# Patient Record
Sex: Female | Born: 1945
Health system: Southern US, Community
[De-identification: ages and names within clinical notes are randomized; demographics above are authoritative.]

## PROBLEM LIST (undated history)

## (undated) DIAGNOSIS — I10 Essential (primary) hypertension: Secondary | ICD-10-CM

## (undated) DIAGNOSIS — E039 Hypothyroidism, unspecified: Secondary | ICD-10-CM

## (undated) DIAGNOSIS — E079 Disorder of thyroid, unspecified: Secondary | ICD-10-CM

## (undated) DIAGNOSIS — I471 Supraventricular tachycardia, unspecified: Secondary | ICD-10-CM

## (undated) DIAGNOSIS — E876 Hypokalemia: Secondary | ICD-10-CM

## (undated) DIAGNOSIS — I491 Atrial premature depolarization: Secondary | ICD-10-CM

## (undated) HISTORY — DX: Disorder of thyroid, unspecified: E07.9

## (undated) HISTORY — DX: Hypothyroidism, unspecified: E03.9

## (undated) HISTORY — DX: Supraventricular tachycardia, unspecified: I47.10

## (undated) HISTORY — DX: Essential (primary) hypertension: I10

## (undated) HISTORY — DX: Atrial premature depolarization: I49.1

## (undated) HISTORY — DX: Hypokalemia: E87.6

---

## 1967-10-22 HISTORY — PX: TUBAL LIGATION: SHX77

## 1978-10-21 HISTORY — PX: VAGINAL HYSTERECTOMY: SUR661

## 1978-10-21 HISTORY — PX: ABDOMINAL HYSTERECTOMY: SHX81

## 2004-10-21 HISTORY — PX: BUNIONECTOMY: SHX129

## 2010-10-10 ENCOUNTER — Ambulatory Visit: Payer: Self-pay | Admitting: Family Medicine

## 2011-10-10 ENCOUNTER — Ambulatory Visit: Payer: Self-pay | Admitting: Family Medicine

## 2011-12-10 DIAGNOSIS — R42 Dizziness and giddiness: Secondary | ICD-10-CM | POA: Diagnosis not present

## 2012-03-24 DIAGNOSIS — E039 Hypothyroidism, unspecified: Secondary | ICD-10-CM | POA: Diagnosis not present

## 2012-03-24 DIAGNOSIS — I10 Essential (primary) hypertension: Secondary | ICD-10-CM | POA: Diagnosis not present

## 2012-05-20 DIAGNOSIS — I10 Essential (primary) hypertension: Secondary | ICD-10-CM | POA: Diagnosis not present

## 2012-05-27 DIAGNOSIS — I10 Essential (primary) hypertension: Secondary | ICD-10-CM | POA: Diagnosis not present

## 2012-05-27 DIAGNOSIS — E039 Hypothyroidism, unspecified: Secondary | ICD-10-CM | POA: Diagnosis not present

## 2012-07-21 DIAGNOSIS — J0100 Acute maxillary sinusitis, unspecified: Secondary | ICD-10-CM | POA: Diagnosis not present

## 2012-08-20 ENCOUNTER — Emergency Department: Payer: Self-pay | Admitting: Emergency Medicine

## 2012-09-06 ENCOUNTER — Emergency Department: Payer: Self-pay | Admitting: Internal Medicine

## 2012-09-06 LAB — CBC
HCT: 45.2 %
HGB: 15.3 g/dL
MCH: 28.8 pg
MCHC: 33.8 g/dL
MCV: 85 fL
Platelet: 282 x10 3/mm 3
RBC: 5.3 X10 6/mm 3 — ABNORMAL HIGH
RDW: 13.4 %
WBC: 8.6 x10 3/mm 3

## 2012-09-06 LAB — COMPREHENSIVE METABOLIC PANEL
Albumin: 4.1 g/dL (ref 3.4–5.0)
Anion Gap: 7 (ref 7–16)
BUN: 13 mg/dL (ref 7–18)
Bilirubin,Total: 0.3 mg/dL (ref 0.2–1.0)
Chloride: 108 mmol/L — ABNORMAL HIGH (ref 98–107)
Creatinine: 0.82 mg/dL (ref 0.60–1.30)
EGFR (African American): >60
Glucose: 118 mg/dL — ABNORMAL HIGH (ref 65–99)
Osmolality: 284 (ref 275–301)
Potassium: 3.6 mmol/L (ref 3.5–5.1)
SGOT(AST): 21 U/L (ref 15–37)
Sodium: 142 mmol/L (ref 136–145)
Total Protein: 7.8 g/dL (ref 6.4–8.2)

## 2012-09-06 LAB — URINALYSIS, COMPLETE
Bacteria: NONE SEEN
Bilirubin,UR: NEGATIVE
Glucose,UR: NEGATIVE mg/dL
Ketone: NEGATIVE
Nitrite: NEGATIVE
Ph: 7
Protein: NEGATIVE
RBC,UR: 3 /HPF
Specific Gravity: 1.011
Squamous Epithelial: 2
WBC UR: 2 /HPF

## 2012-09-29 DIAGNOSIS — N8111 Cystocele, midline: Secondary | ICD-10-CM | POA: Diagnosis not present

## 2012-09-29 DIAGNOSIS — N952 Postmenopausal atrophic vaginitis: Secondary | ICD-10-CM | POA: Diagnosis not present

## 2012-10-26 DIAGNOSIS — R319 Hematuria, unspecified: Secondary | ICD-10-CM | POA: Diagnosis not present

## 2012-11-05 DIAGNOSIS — Z23 Encounter for immunization: Secondary | ICD-10-CM | POA: Diagnosis not present

## 2012-11-27 DIAGNOSIS — I10 Essential (primary) hypertension: Secondary | ICD-10-CM | POA: Diagnosis not present

## 2012-11-27 DIAGNOSIS — E039 Hypothyroidism, unspecified: Secondary | ICD-10-CM | POA: Diagnosis not present

## 2013-06-25 DIAGNOSIS — E039 Hypothyroidism, unspecified: Secondary | ICD-10-CM | POA: Diagnosis not present

## 2013-07-02 DIAGNOSIS — E039 Hypothyroidism, unspecified: Secondary | ICD-10-CM | POA: Diagnosis not present

## 2013-10-26 DIAGNOSIS — Z23 Encounter for immunization: Secondary | ICD-10-CM | POA: Diagnosis not present

## 2014-03-01 DIAGNOSIS — E039 Hypothyroidism, unspecified: Secondary | ICD-10-CM | POA: Diagnosis not present

## 2014-03-04 ENCOUNTER — Ambulatory Visit: Payer: Self-pay

## 2014-03-08 DIAGNOSIS — E039 Hypothyroidism, unspecified: Secondary | ICD-10-CM | POA: Diagnosis not present

## 2014-03-23 DIAGNOSIS — B9689 Other specified bacterial agents as the cause of diseases classified elsewhere: Secondary | ICD-10-CM | POA: Diagnosis not present

## 2014-03-23 DIAGNOSIS — J329 Chronic sinusitis, unspecified: Secondary | ICD-10-CM | POA: Diagnosis not present

## 2014-03-23 DIAGNOSIS — Z1239 Encounter for other screening for malignant neoplasm of breast: Secondary | ICD-10-CM | POA: Diagnosis not present

## 2014-03-23 DIAGNOSIS — I10 Essential (primary) hypertension: Secondary | ICD-10-CM | POA: Diagnosis not present

## 2014-03-23 DIAGNOSIS — A499 Bacterial infection, unspecified: Secondary | ICD-10-CM | POA: Diagnosis not present

## 2014-06-06 DIAGNOSIS — E039 Hypothyroidism, unspecified: Secondary | ICD-10-CM | POA: Diagnosis not present

## 2014-06-10 DIAGNOSIS — E039 Hypothyroidism, unspecified: Secondary | ICD-10-CM | POA: Diagnosis not present

## 2014-09-12 DIAGNOSIS — E039 Hypothyroidism, unspecified: Secondary | ICD-10-CM | POA: Diagnosis not present

## 2014-09-19 DIAGNOSIS — E039 Hypothyroidism, unspecified: Secondary | ICD-10-CM | POA: Diagnosis not present

## 2015-01-19 DIAGNOSIS — I10 Essential (primary) hypertension: Secondary | ICD-10-CM | POA: Diagnosis not present

## 2015-01-19 DIAGNOSIS — M489 Spondylopathy, unspecified: Secondary | ICD-10-CM | POA: Diagnosis not present

## 2015-01-19 DIAGNOSIS — M158 Other polyosteoarthritis: Secondary | ICD-10-CM | POA: Diagnosis not present

## 2015-01-19 DIAGNOSIS — E039 Hypothyroidism, unspecified: Secondary | ICD-10-CM | POA: Diagnosis not present

## 2015-01-24 DIAGNOSIS — M1712 Unilateral primary osteoarthritis, left knee: Secondary | ICD-10-CM | POA: Diagnosis not present

## 2015-01-24 DIAGNOSIS — M19022 Primary osteoarthritis, left elbow: Secondary | ICD-10-CM | POA: Diagnosis not present

## 2015-01-24 DIAGNOSIS — M19011 Primary osteoarthritis, right shoulder: Secondary | ICD-10-CM | POA: Diagnosis not present

## 2015-05-09 DIAGNOSIS — E039 Hypothyroidism, unspecified: Secondary | ICD-10-CM | POA: Diagnosis not present

## 2015-05-09 DIAGNOSIS — E079 Disorder of thyroid, unspecified: Secondary | ICD-10-CM | POA: Diagnosis not present

## 2015-07-30 DIAGNOSIS — Z23 Encounter for immunization: Secondary | ICD-10-CM | POA: Diagnosis not present

## 2015-10-19 ENCOUNTER — Telehealth: Payer: Self-pay

## 2015-10-19 NOTE — Telephone Encounter (Signed)
The form is ready but because she has not followed up for a Medicare Wellness exam, seen only 2x by Dr. Nadine Counts (last visit 12/2014, chart not in EPIC), there will be a charge of $25 when she picks up her form.   All Medicare patients are required to have annual medicare wellness exams once a year.

## 2015-10-19 NOTE — Telephone Encounter (Signed)
Patient called wanting to know if her handicap sticker paperwork was completed so she can come by and pick it up. Patient was informed that Dr. Nadine Counts is out of the office today but will return tomorrow and that her forms should be ready for pick up in the morning.  Patient stated that she will call back in the morning.

## 2015-11-08 DIAGNOSIS — E039 Hypothyroidism, unspecified: Secondary | ICD-10-CM | POA: Diagnosis not present

## 2015-11-15 DIAGNOSIS — E039 Hypothyroidism, unspecified: Secondary | ICD-10-CM | POA: Diagnosis not present

## 2016-04-18 ENCOUNTER — Other Ambulatory Visit: Payer: Self-pay | Admitting: Family Medicine

## 2016-04-18 DIAGNOSIS — R39198 Other difficulties with micturition: Secondary | ICD-10-CM | POA: Insufficient documentation

## 2016-04-18 LAB — URINALYSIS W MICROSCOPIC + REFLEX CULTURE
BACTERIA UA: NONE SEEN [HPF]
Bilirubin Urine: NEGATIVE
CASTS: NONE SEEN [LPF]
CRYSTALS: NONE SEEN [HPF]
Glucose, UA: NEGATIVE
HGB URINE DIPSTICK: NEGATIVE
Ketones, ur: NEGATIVE
Leukocytes, UA: NEGATIVE
Nitrite: NEGATIVE
PROTEIN: NEGATIVE
RBC / HPF: NONE SEEN RBC/HPF (ref <=2)
Specific Gravity, Urine: 1.009 (ref 1.001–1.035)
Squamous Epithelial / LPF: NONE SEEN [HPF] (ref <=5)
WBC, UA: NONE SEEN WBC/HPF (ref <=5)
YEAST: NONE SEEN [HPF]
pH: 7 (ref 5.0–8.0)

## 2016-05-07 DIAGNOSIS — E039 Hypothyroidism, unspecified: Secondary | ICD-10-CM | POA: Diagnosis not present

## 2016-05-20 DIAGNOSIS — E039 Hypothyroidism, unspecified: Secondary | ICD-10-CM | POA: Diagnosis not present

## 2016-07-09 DIAGNOSIS — Z23 Encounter for immunization: Secondary | ICD-10-CM | POA: Diagnosis not present

## 2016-09-02 ENCOUNTER — Encounter: Payer: Self-pay | Admitting: Family Medicine

## 2016-09-03 ENCOUNTER — Encounter: Payer: Self-pay | Admitting: Family Medicine

## 2016-09-03 ENCOUNTER — Ambulatory Visit (INDEPENDENT_AMBULATORY_CARE_PROVIDER_SITE_OTHER): Payer: Medicare Other | Admitting: Family Medicine

## 2016-09-03 DIAGNOSIS — I10 Essential (primary) hypertension: Secondary | ICD-10-CM | POA: Diagnosis not present

## 2016-09-03 DIAGNOSIS — J019 Acute sinusitis, unspecified: Secondary | ICD-10-CM | POA: Diagnosis not present

## 2016-09-03 DIAGNOSIS — J069 Acute upper respiratory infection, unspecified: Secondary | ICD-10-CM | POA: Diagnosis not present

## 2016-09-03 DIAGNOSIS — E039 Hypothyroidism, unspecified: Secondary | ICD-10-CM | POA: Diagnosis not present

## 2016-09-03 DIAGNOSIS — G8929 Other chronic pain: Secondary | ICD-10-CM

## 2016-09-03 DIAGNOSIS — M25562 Pain in left knee: Secondary | ICD-10-CM | POA: Diagnosis not present

## 2016-09-03 DIAGNOSIS — Z636 Dependent relative needing care at home: Secondary | ICD-10-CM | POA: Diagnosis not present

## 2016-09-03 DIAGNOSIS — B9789 Other viral agents as the cause of diseases classified elsewhere: Secondary | ICD-10-CM | POA: Diagnosis not present

## 2016-09-03 MED ORDER — AMOXICILLIN-POT CLAVULANATE 875-125 MG PO TABS: 1.0000 | ORAL_TABLET | Freq: Two times a day (BID) | ORAL | 0 refills | Status: DC

## 2016-09-03 NOTE — Progress Notes (Signed)
BP (!) 152/100   Temp 98.6 F (37 C) (Oral)   Resp 16   Ht 5\' 2"  (1.575 m)   Wt 136 lb (61.7 kg)   SpO2 98%   BMI 24.87 kg/m    Subjective:    Patient ID: Annette Stevens, female    DOB: 07-14-1946, 70 y.o.   MRN: [id number]  HPI: Annette Stevens is a 70 y.o. female  Chief Complaint  Patient presents with  . URI    sore throat, ear pain,headache,cough, congestion. onset 5 days   Patient is here for an acute visit; sick for five days Sinus pressure, chest congestion Sore throat, irritated; using apple cider vinegar, generic 1/4 pill of cough and cold HBP; sensitive to pills Temp is a degree or two hgher than usual; few chills Lots of stress with husband sick, not much time for taking care of herself  Left knee pain; tripped and slammed kneecap on the driveway; months ago; fatty growth behind lateral LEFT knee  Hard little knot lateral right foot; uses some tape  Hypothyroidism; going to see Dr. Gabriel Carina  High blood pressure; has tried so many medicines; patient politely refuses any medicine  Depression screen [facility] 2/9 09/03/2016  Decreased Interest 0  Down, Depressed, Hopeless 0  PHQ - 2 Score 0   Relevant past medical, surgical, family and social history reviewed Past Medical History:  Diagnosis Date  . Hypertension   . Thyroid disease    Past Surgical History:  Procedure Laterality Date  . ABDOMINAL HYSTERECTOMY  1980  . BUNIONECTOMY Left 2006  . TUBAL LIGATION  1969   Family History  Problem Relation Age of Onset  . Cancer Mother     colon  . Celiac disease Mother   . Lymphoma Father   . Hypertension Father    Social History  Substance Use Topics  . Smoking status: Former Smoker    Quit date: 1995  . Smokeless tobacco: Never Used  . Alcohol use No   Interim medical history since last visit reviewed. Allergies and medications reviewed  Review of Systems Per HPI unless specifically indicated above     Objective:    BP (!) 152/100   Temp 98.6 F  (37 C) (Oral)   Resp 16   Ht 5\' 2"  (1.575 m)   Wt 136 lb (61.7 kg)   SpO2 98%   BMI 24.87 kg/m   Wt Readings from Last 3 Encounters:  09/03/16 136 lb (61.7 kg)    Physical Exam  Constitutional: She appears well-developed and well-nourished. No distress.  HENT:  Head: Normocephalic and atraumatic.  Nose: Mucosal edema and rhinorrhea present. Right sinus exhibits no maxillary sinus tenderness and no frontal sinus tenderness. Left sinus exhibits no maxillary sinus tenderness and no frontal sinus tenderness.  Mouth/Throat: Oropharynx is clear and moist and mucous membranes are normal.  Swelling and erythema left much more than right; cloudy rhinorrhea  Eyes: EOM are normal. No scleral icterus.  Neck: No thyromegaly present.  Cardiovascular: Normal rate, regular rhythm and normal heart sounds.   No murmur heard. Pulmonary/Chest: Effort normal and breath sounds normal. No respiratory distress. She has no wheezes.  Abdominal: Soft. Bowel sounds are normal. She exhibits no distension.  Musculoskeletal: Normal range of motion. She exhibits no edema.       Left knee: She exhibits swelling (lateral and posterior left knee, soft). She exhibits no deformity. Tenderness found.  Lymphadenopathy:    She has no cervical adenopathy.  Neurological: She is  alert. She exhibits normal muscle tone.  Skin: Skin is warm and dry. She is not diaphoretic. No pallor.  Psychiatric: She has a normal mood and affect. Her behavior is normal. Judgment and thought content normal.   Results for orders placed or performed in visit on 04/18/16  Urinalysis w microscopic + reflex cultur  Result Value Ref Range   Color, Urine YELLOW YELLOW   APPearance CLEAR CLEAR   Specific Gravity, Urine 1.009 1.001 - 1.035   pH 7.0 5.0 - 8.0   Glucose, UA NEGATIVE NEGATIVE   Bilirubin Urine NEGATIVE NEGATIVE   Ketones, ur NEGATIVE NEGATIVE   Hgb urine dipstick NEGATIVE NEGATIVE   Protein, ur NEGATIVE NEGATIVE   Nitrite  NEGATIVE NEGATIVE   Leukocytes, UA NEGATIVE NEGATIVE   WBC, UA NONE SEEN <=5 WBC/HPF   RBC / HPF NONE SEEN <=2 RBC/HPF   Squamous Epithelial / LPF NONE SEEN <=5 HPF   Bacteria, UA NONE SEEN NONE SEEN HPF   Crystals NONE SEEN NONE SEEN HPF   Casts NONE SEEN NONE SEEN LPF   Yeast NONE SEEN NONE SEEN HPF      Assessment & Plan:   Problem List Items Addressed This Visit      Cardiovascular and Mediastinum   Essential hypertension, benign    Patient refuses medicine; encouraged DASH guidelines; risk of stroke with uncontrolled BP        Respiratory   Viral URI    Suspect this started as viral illness, but she developed secondary bacterial infection in the sinuses; rest, hydration; discussed stress, caregiving, time for taking care of herself important, etc      Acute sinusitis    Suspect this started as viral process, now secondary bacterial infection; rest, hydration, etc      Relevant Medications   amoxicillin-clavulanate (AUGMENTIN) 875-125 MG tablet     Endocrine   Hypothyroidism    Followed by endocrinologist      Relevant Medications   levothyroxine (SYNTHROID, LEVOTHROID) 125 MCG tablet     Other   Left knee pain    Call if xray or referral desired      Caregiver stress    Talked with patient about her stress, supportive listening provided; her husband has metastatic cancer; encouraged her to take time for herself          Follow up plan: Return in about 3 weeks (around 09/24/2016) for follow-up, blood pressure.  An after-visit summary was printed and given to the patient at [place].  Please see the patient instructions which may contain other information and recommendations beyond what is mentioned above in the assessment and plan.  Meds ordered this encounter  Medications  . Cholecalciferol (VITAMIN D3) 5000 UNIT/ML LIQD    Sig: Take by mouth.  . Coenzyme Q10 10 MG capsule    Sig: Take by mouth.  . Omega-3 Fatty Acids (FISH OIL) 1000 MG CAPS     Sig: Take by mouth.  . Lutein 10 MG TABS    Sig: Take by mouth.  . Magnesium 100 MG CAPS    Sig: Take by mouth.  . Pediatric Multivitamins-Fl (MULTIVITAMINS/FL PO)    Sig: Take by mouth.  . Red Yeast Rice 600 MG TABS    Sig: Take by mouth.  . levothyroxine (SYNTHROID, LEVOTHROID) 125 MCG tablet    Sig: Take 125 mcg by mouth daily before breakfast.  . amoxicillin-clavulanate (AUGMENTIN) 875-125 MG tablet    Sig: Take 1 tablet by mouth 2 (two) times  daily.    Dispense:  20 tablet    Refill:  0   Face-to-face time with patient was more than 25 minutes, >50% time spent counseling and coordination of care

## 2016-09-03 NOTE — Patient Instructions (Signed)
Start antibiotics Please do eat yogurt daily or take a probiotic daily for the next month or two We want to replace the healthy germs in the gut If you notice foul, watery diarrhea in the next two months, schedule an appointment RIGHT AWAY Your goal blood pressure is less than 150 mmHg on top. Try to follow the DASH guidelines (DASH stands for Dietary Approaches to Stop Hypertension) Try to limit the sodium in your diet.  Ideally, consume less than 1.5 grams (less than 1,500mg ) per day. Do not add salt when cooking or at the table.  Check the sodium amount on labels when shopping, and choose items lower in sodium when given a choice. Avoid or limit foods that already contain a lot of sodium. Eat a diet rich in fruits and vegetables and whole grains.

## 2016-09-04 ENCOUNTER — Telehealth: Payer: Self-pay | Admitting: Family Medicine

## 2016-09-04 DIAGNOSIS — M25562 Pain in left knee: Secondary | ICD-10-CM | POA: Insufficient documentation

## 2016-09-04 DIAGNOSIS — G8929 Other chronic pain: Secondary | ICD-10-CM

## 2016-09-04 NOTE — Telephone Encounter (Signed)
Patient notified, and sent call up front to setup mychart.

## 2016-09-04 NOTE — Telephone Encounter (Signed)
Patient sent note through her husband's MyChart account: GREETINGS !!!!  Thank you for seeing me. One question...can I get the left knee x-rayed to see what's going on?? Also can I get an account number to go into my chart and not Frank's ???  Annette Stevens -------------------------------- Staff, Let her know I'll order xray Please help her get needed information to establish a MyChart account for herself Thank you

## 2016-09-04 NOTE — Assessment & Plan Note (Signed)
Xray requested by patient

## 2016-09-06 ENCOUNTER — Ambulatory Visit
Admission: RE | Admit: 2016-09-06 | Discharge: 2016-09-06 | Disposition: A | Discharge status: Home / Self Care | Payer: Medicare Other | Source: Ambulatory Visit | Attending: Family Medicine | Admitting: Family Medicine

## 2016-09-06 DIAGNOSIS — M25462 Effusion, left knee: Secondary | ICD-10-CM | POA: Diagnosis not present

## 2016-09-06 DIAGNOSIS — M1712 Unilateral primary osteoarthritis, left knee: Secondary | ICD-10-CM | POA: Insufficient documentation

## 2016-09-06 DIAGNOSIS — M25562 Pain in left knee: Secondary | ICD-10-CM | POA: Diagnosis not present

## 2016-09-11 ENCOUNTER — Telehealth: Payer: Self-pay | Admitting: Family Medicine

## 2016-09-11 NOTE — Telephone Encounter (Signed)
Patient sent a MyChart message through her husband's chart: Sent: 09/07/2016  7:48 PM  To: [facility]  Subject: Non-Urgent Medical Question             Greetings .Marland KitchenMarland KitchenHaving a problem getting into my chart. I'll sent this thru Frank's.  My tetanus shot was : 06/19/2008  Cecille Rubin shot was     :  01/07/2012    also what would the shot be for the knee ???? I don't just do things that my not be needed.   Maniyah Guymon    Still not feeling much better. Feels like I have a lump in my throat.   Staff, Please update her tetanus booster info if needed.  Please let patient know we really need to keep their two charts separate. Please only send information about him through his MyChart account, and set up an account for her own concerns. This is for their own safety.  In regards to her question, there are two kinds of shots that are put in the knee. One is a steroid for temporary relief of pain and inflammation. The other is a type of artificial cartilage, so to speak, and is usually given as a series of three. The orthopaedist can talk with her to help determine which is the best option for her.  Try gargling with salt water, drinking green tea, and getting some extra vitamin C. If symptoms persist, let us know.  Thank you

## 2016-09-13 DIAGNOSIS — E039 Hypothyroidism, unspecified: Secondary | ICD-10-CM | POA: Insufficient documentation

## 2016-09-13 DIAGNOSIS — I10 Essential (primary) hypertension: Secondary | ICD-10-CM | POA: Insufficient documentation

## 2016-09-13 DIAGNOSIS — Z636 Dependent relative needing care at home: Secondary | ICD-10-CM | POA: Insufficient documentation

## 2016-09-13 DIAGNOSIS — J069 Acute upper respiratory infection, unspecified: Secondary | ICD-10-CM | POA: Insufficient documentation

## 2016-09-13 DIAGNOSIS — J019 Acute sinusitis, unspecified: Secondary | ICD-10-CM | POA: Insufficient documentation

## 2016-09-13 NOTE — Assessment & Plan Note (Signed)
Talked with patient about her stress, supportive listening provided; her husband has metastatic cancer; encouraged her to take time for herself

## 2016-09-13 NOTE — Assessment & Plan Note (Signed)
Patient refuses medicine; encouraged DASH guidelines; risk of stroke with uncontrolled BP

## 2016-09-13 NOTE — Assessment & Plan Note (Signed)
Suspect this started as viral process, now secondary bacterial infection; rest, hydration, etc

## 2016-09-13 NOTE — Assessment & Plan Note (Signed)
Followed by endocrinologist

## 2016-09-13 NOTE — Assessment & Plan Note (Signed)
Call if xray or referral desired

## 2016-09-13 NOTE — Assessment & Plan Note (Addendum)
Suspect this started as viral illness, but she developed secondary bacterial infection in the sinuses; rest, hydration; discussed stress, caregiving, time for taking care of herself important, etc

## 2016-09-16 NOTE — Telephone Encounter (Signed)
Patient notified about knee injections and about putting info in husband chart.  Pt states still has sinus infection and states "has tried it your way, now its time for her way"  She wants a zpak? Also she states husband is sick and on chemo and has low immune system and he shouldn't be around someone with an infection, so she needs this gone.

## 2016-09-19 MED ORDER — AZITHROMYCIN 250 MG PO TABS: ORAL_TABLET | ORAL | 0 refills | Status: AC

## 2016-09-19 NOTE — Telephone Encounter (Signed)
I spoke w/patient; explained resistance to zpak 25%, she is willing to try Discussed her husband, support offered; prayer offered

## 2016-11-13 DIAGNOSIS — E039 Hypothyroidism, unspecified: Secondary | ICD-10-CM | POA: Diagnosis not present

## 2016-11-20 DIAGNOSIS — E039 Hypothyroidism, unspecified: Secondary | ICD-10-CM | POA: Diagnosis not present

## 2016-11-22 ENCOUNTER — Encounter: Payer: Self-pay | Admitting: Family Medicine

## 2016-11-25 DIAGNOSIS — E039 Hypothyroidism, unspecified: Secondary | ICD-10-CM | POA: Diagnosis not present

## 2017-02-06 DIAGNOSIS — E039 Hypothyroidism, unspecified: Secondary | ICD-10-CM | POA: Diagnosis not present

## 2017-02-10 DIAGNOSIS — E039 Hypothyroidism, unspecified: Secondary | ICD-10-CM | POA: Diagnosis not present

## 2017-03-05 ENCOUNTER — Ambulatory Visit (INDEPENDENT_AMBULATORY_CARE_PROVIDER_SITE_OTHER): Payer: Medicare Other | Admitting: Family Medicine

## 2017-03-05 ENCOUNTER — Encounter: Payer: Self-pay | Admitting: Family Medicine

## 2017-03-05 VITALS — BP 120/84 | HR 101 | Temp 98.5°F | Ht 62.0 in | Wt 137.5 lb

## 2017-03-05 DIAGNOSIS — N3001 Acute cystitis with hematuria: Secondary | ICD-10-CM | POA: Diagnosis not present

## 2017-03-05 LAB — POCT URINALYSIS DIPSTICK
BILIRUBIN UA: NEGATIVE
Glucose, UA: NEGATIVE
KETONES UA: NEGATIVE
Nitrite, UA: NEGATIVE
PH UA: 7.5 (ref 5.0–8.0)
SPEC GRAV UA: 1.01 (ref 1.010–1.025)
Urobilinogen, UA: NEGATIVE E.U./dL — AB

## 2017-03-05 MED ORDER — CIPROFLOXACIN HCL 250 MG PO TABS: 250.0000 mg | ORAL_TABLET | Freq: Two times a day (BID) | ORAL | 0 refills | Status: DC

## 2017-03-05 NOTE — Patient Instructions (Addendum)

## 2017-03-05 NOTE — Progress Notes (Addendum)
Name: Annette Stevens   MRN: [id number]    DOB: 01-10-1946   Date:03/05/2017       Progress Note  Subjective  Chief Complaint  Chief Complaint  Patient presents with  . Urinary Tract Infection    HPI  Pt presents with complaint of 1 day of hematuria, urinary frequency with pressure/urgency. No fevers/chills, abdominal pain, fatigue, or burning with urination.  No history of kidney stones.   Patient Active Problem List   Diagnosis Date Noted  . Essential hypertension, benign 09/13/2016  . Hypothyroidism 09/13/2016  . Viral URI 09/13/2016  . Caregiver stress 09/13/2016  . Acute sinusitis 09/13/2016  . Left knee pain 09/04/2016  . Abnormal urination 04/18/2016    Social History  Substance Use Topics  . Smoking status: Former Smoker    Quit date: 1995  . Smokeless tobacco: Never Used  . Alcohol use No     Current Outpatient Prescriptions:  .  Cholecalciferol (VITAMIN D3) 5000 UNIT/ML LIQD, Take by mouth., Disp: , Rfl:  .  Coenzyme Q10 10 MG capsule, Take by mouth., Disp: , Rfl:  .  levothyroxine (SYNTHROID, LEVOTHROID) 125 MCG tablet, Take 125 mcg by mouth daily before breakfast., Disp: , Rfl:  .  Lutein 10 MG TABS, Take by mouth., Disp: , Rfl:  .  Magnesium 100 MG CAPS, Take by mouth., Disp: , Rfl:  .  Omega-3 Fatty Acids (FISH OIL) 1000 MG CAPS, Take by mouth., Disp: , Rfl:  .  Pediatric Multivitamins-Fl (MULTIVITAMINS/FL PO), Take by mouth., Disp: , Rfl:  .  Red Yeast Rice 600 MG TABS, Take by mouth., Disp: , Rfl:   No Known Allergies  ROS  Constitutional: Negative for fever or weight change.  Respiratory: Negative for cough and shortness of breath.   Cardiovascular: Negative for chest pain or palpitations.  Gastrointestinal: Negative for abdominal pain, no bowel changes.  Musculoskeletal: Negative for gait problem or joint swelling.  Skin: Negative for rash.  Neurological: Negative for dizziness or headache.  No other specific complaints in a complete review of  systems (except as listed in HPI above).  Objective  Vitals:   03/05/17 1410  BP: 120/84  Pulse: (!) 101  Temp: 98.5 F (36.9 C)  TempSrc: Oral  Weight: 137 lb 8 oz (62.4 kg)  Height: 5\' 2"  (1.575 m)    Body mass index is 25.15 kg/m.  Nursing Note and Vital Signs reviewed.  Physical Exam  Constitutional: Patient appears well-developed and well-nourished. No distress.  HEENT: head atraumatic, normocephalic Cardiovascular: Normal rate, regular rhythm, S1/S2 present.  No murmur or rub heard. No BLE edema. Pulmonary/Chest: Effort normal and breath sounds clear. No respiratory distress or retractions. Abdominal: Soft and non-tender, bowel sounds present x4 quadrants. GU: Deferred. Psychiatric: Patient has a normal mood and affect. behavior is normal. Judgment and thought content normal.  Recent Results (from the past 2160 hour(s))  POCT urinalysis dipstick     Status: Abnormal   Collection Time: 03/05/17  2:18 PM  Result Value Ref Range   Color, UA yellow    Clarity, UA clear    Glucose, UA negative    Bilirubin, UA negative    Ketones, UA negative    Spec Grav, UA 1.010 1.010 - 1.025   Blood, UA large    pH, UA 7.5 5.0 - 8.0   Protein, UA trace    Urobilinogen, UA negative (A) 0.2 or 1.0 E.U./dL   Nitrite, UA negative    Leukocytes, UA Trace (A) Negative  Assessment & Plan  1. Acute cystitis with hematuria - POCT urinalysis dipstick - ciprofloxacin (CIPRO) 250 MG tablet; Take 1 tablet (250 mg total) by mouth 2 (two) times daily.  Dispense: 6 tablet; Refill: 0 - Urine Culture  -Red flags and when to present for emergency care or RTC including fever >101.62F, abdominal or back pain, new/worsening/un-resolving symptoms, reviewed with patient at time of visit. Follow up and care instructions discussed and provided in AVS.  -Needs to schedule AWV and annual exam - will schedule when she is able, but has very busy schedule caring for husband who has esophageal  cancer.  I have reviewed this encounter including the documentation in this note and/or discussed this patient with the Johney Maine, FNP, NP-C. I am certifying that I agree with the content of this note as supervising physician.  Steele Sizer, MD [facility] 03/05/2017, 2:41 PM

## 2017-03-06 LAB — URINE CULTURE: Organism ID, Bacteria: NO GROWTH

## 2017-03-07 NOTE — Progress Notes (Signed)
Please call patient - her urine culture showed no growth of bacteria, she may finish the Cipro if it's making her feel better, if it's not helping she can stop.  Because her urine dip showed large amnt of Hgb we need to bring her back for nurse visit for a urine recollect and send it for micro.  If the micro still shows blood or her symptoms persist, we'll send her to urology. Thank you!

## 2017-03-10 ENCOUNTER — Telehealth: Payer: Self-pay | Admitting: Family Medicine

## 2017-03-10 DIAGNOSIS — R319 Hematuria, unspecified: Secondary | ICD-10-CM

## 2017-03-10 DIAGNOSIS — R39198 Other difficulties with micturition: Secondary | ICD-10-CM | POA: Diagnosis not present

## 2017-03-10 DIAGNOSIS — N8110 Cystocele, unspecified: Secondary | ICD-10-CM

## 2017-03-10 NOTE — Telephone Encounter (Addendum)
Spoke with patient regarding urinary symptoms -  no longer experiencing urinary frequency/urgency or hematuria; Now notes that she has been having trouble emptying her bladder completely and states was seen several years ago for cystocele which has been progressively worsening. Advised we will still check urine Micro due to previous blood in urine, but she needs to see OB/GYN for evaluation. Urine Micro is ordered.

## 2017-03-11 LAB — URINALYSIS, MICROSCOPIC ONLY
Bacteria, UA: NONE SEEN [HPF]
CASTS: NONE SEEN [LPF]
Crystals: NONE SEEN [HPF]
RBC / HPF: NONE SEEN RBC/HPF (ref <=2)
Squamous Epithelial / LPF: NONE SEEN [HPF] (ref <=5)
WBC, UA: NONE SEEN WBC/HPF (ref <=5)
YEAST: NONE SEEN [HPF]

## 2017-03-11 NOTE — Telephone Encounter (Signed)
Hello Mrs. Marcial Pacas,  Your urine micro is completely negative, so there is no need to see a urologist.  I have placed the referral to Gynecology per our discussion. I advised Kristeen Miss (our referral coordinator) of your scheduling/location requests and she is working to secure an appointment for you. If you have any questions please do not hesitate to reach out.   Thank you, Raelyn Ensign NP-C

## 2017-03-12 ENCOUNTER — Telehealth: Payer: Self-pay | Admitting: Obstetrics & Gynecology

## 2017-03-12 NOTE — Telephone Encounter (Signed)
Called patient x 2- no answer.

## 2017-03-12 NOTE — Telephone Encounter (Signed)
Pt is being referred by [facility] medical for Female cystocele. Please schedule pt with MD when she returns call.

## 2017-03-13 NOTE — Telephone Encounter (Signed)
Left message for patient

## 2017-03-14 NOTE — Telephone Encounter (Signed)
Call pt and left voicemail to call back to be schedule

## 2017-03-18 NOTE — Telephone Encounter (Signed)
Pt is schedule [facility] on 04/10/17

## 2017-04-10 ENCOUNTER — Encounter: Payer: Self-pay | Admitting: Obstetrics & Gynecology

## 2017-04-10 ENCOUNTER — Ambulatory Visit (INDEPENDENT_AMBULATORY_CARE_PROVIDER_SITE_OTHER): Payer: Medicare Other | Admitting: Obstetrics & Gynecology

## 2017-04-10 VITALS — BP 148/90 | HR 76 | Ht 62.0 in | Wt 136.0 lb

## 2017-04-10 DIAGNOSIS — N8111 Cystocele, midline: Secondary | ICD-10-CM
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
## 2017-04-10 NOTE — Patient Instructions (Signed)
Pelvic Organ Prolapse Pelvic organ prolapse is the stretching, bulging, or dropping of pelvic organs into an abnormal position. It happens when the muscles and tissues that surround and support pelvic structures are stretched or weak. Pelvic organ prolapse can involve:  Vagina (vaginal prolapse).  Uterus (uterine prolapse).  Bladder (cystocele).  Rectum (rectocele).  Intestines (enterocele).  When organs other than the vagina are involved, they often bulge into the vagina or protrude from the vagina, depending on how severe the prolapse is. What are the causes? Causes of this condition include:  Pregnancy, labor, and childbirth.  Long-lasting (chronic) cough.  Chronic constipation.  Obesity.  Past pelvic surgery.  Aging. During and after menopause, a decreased production of the hormone estrogen can weaken pelvic ligaments and muscles.  Consistently lifting more than 50 lb (23 kg).  Buildup of fluid in the abdomen due to certain diseases and other conditions.  What are the signs or symptoms? Symptoms of this condition include:  Loss of bladder control when you cough, sneeze, strain, and exercise (stress incontinence). This may be worse immediately following childbirth, and it may gradually improve over time.  Feeling pressure in your pelvis or vagina. This pressure may increase when you cough or when you are having a bowel movement.  A bulge that protrudes from the opening of your vagina or against your vaginal wall. If your uterus protrudes through the opening of your vagina and rubs against your clothing, you may also experience soreness, ulcers, infection, pain, and bleeding.  Increased effort to have a bowel movement or urinate.  Pain in your low back.  Pain, discomfort, or disinterest in sexual intercourse.  Repeated bladder infections (urinary tract infections).  Difficulty inserting or inability to insert a tampon or applicator.  In some people, this  condition does not cause any symptoms. How is this diagnosed? Your health care provider may perform an internal and external vaginal and rectal exam. During the exam, you may be asked to cough and strain while you are lying down, sitting, and standing up. Your health care provider will determine if other tests are required, such as bladder function tests. How is this treated? In most cases, this condition needs to be treated only if it produces symptoms. No treatment is guaranteed to correct the prolapse or relieve the symptoms completely. Treatment may include:  Lifestyle changes, such as: ? Avoiding drinking beverages that contain caffeine. ? Increasing your intake of high-fiber foods. This can help to decrease constipation and straining during bowel movements. ? Emptying your bladder at scheduled times (bladder training therapy). This can help to reduce or avoid urinary incontinence. ? Losing weight if you are overweight or obese.  Estrogen. Estrogen may help mild prolapse by increasing the strength and tone of pelvic floor muscles.  Kegel exercises. These may help mild cases of prolapse by strengthening and tightening the muscles of the pelvic floor.  Pessary insertion. A pessary is a soft, flexible device that is placed into your vagina by your health care provider to help support the vaginal walls and keep pelvic organs in place.  Surgery. This is often the only form of treatment for severe prolapse. Different types of surgeries are available.  Follow these instructions at home:  Wear a sanitary pad or absorbent product if you have urinary incontinence.  Avoid heavy lifting and straining with exercise and work. Do not hold your breath when you perform mild to moderate lifting and exercise activities. Limit your activities as directed by your health care   provider.  Take medicines only as directed by your health care provider.  Perform Kegel exercises as directed by your health care  provider.  If you have a pessary, take care of it as directed by your health care provider. Contact a health care provider if:  Your symptoms interfere with your daily activities or sex life.  You need medicine to help with the discomfort.  You notice bleeding from the vagina that is not related to your period.  You have a fever.  You have pain or bleeding when you urinate.  You have bleeding when you have a bowel movement.  You lose urine when you have sex.  You have chronic constipation.  You have a pessary that falls out.  You have vaginal discharge that has a bad smell.  You have low abdominal pain or cramping that is unusual for you. This information is not intended to replace advice given to you by your health care provider. Make sure you discuss any questions you have with your health care provider. Document Released: 05/04/2014 Document Revised: 03/14/2016 Document Reviewed: 12/20/2013 Elsevier Interactive Patient Education  2018 Elsevier Inc.  

## 2017-04-11 ENCOUNTER — Telehealth: Payer: Self-pay | Admitting: Obstetrics & Gynecology

## 2017-04-11 ENCOUNTER — Encounter: Payer: Self-pay | Admitting: Obstetrics and Gynecology

## 2017-04-11 ENCOUNTER — Ambulatory Visit (INDEPENDENT_AMBULATORY_CARE_PROVIDER_SITE_OTHER): Payer: Medicare Other | Admitting: Obstetrics and Gynecology

## 2017-04-11 VITALS — BP 154/84 | HR 99

## 2017-04-11 DIAGNOSIS — T839XXA Unspecified complication of genitourinary prosthetic device, implant and graft, initial encounter: Secondary | ICD-10-CM | POA: Diagnosis not present

## 2017-04-11 NOTE — Telephone Encounter (Signed)
Pt is calling about her pessary. Pt states it fell out an she would like to be worked in on Monday schedule. Pt refused opening with Dr Glennon Mac opening in schedule. Please advise work [phone]

## 2017-04-11 NOTE — Telephone Encounter (Signed)
Pt to see Dr. Georgianne Fick 04/11/17

## 2017-04-11 NOTE — Telephone Encounter (Signed)
OK 

## 2017-04-14 ENCOUNTER — Ambulatory Visit: Payer: Medicare Other | Admitting: Obstetrics & Gynecology

## 2017-04-14 NOTE — Progress Notes (Signed)
Obstetrics & Gynecology Office Visit   Chief Complaint:  Chief Complaint  Patient presents with  . Pessary Check    pain since pessary insertion/ it fell out/ reinsertion     History of Present Illness: 71 year old patient s/p pessary insertion yesterday by Dr. Kenton Kingfisher presenting with pessary expulsion.  The patient denies pain or bleeding.  Pessary was not causing any discomfort while in and she noted improvement in her urinary symptoms while it was in place.     Review of Systems: 10 point review of sysems negative unless otherwise noted in HPI  Past Medical History:  Past Medical History:  Diagnosis Date  . Hypertension   . Thyroid disease     Past Surgical History:  Past Surgical History:  Procedure Laterality Date  . ABDOMINAL HYSTERECTOMY  1980  . BUNIONECTOMY Left 2006  . TUBAL LIGATION  1969    Gynecologic History: No LMP recorded. Patient has had a hysterectomy.  Obstetric History: [id number]  Family History:  Family History  Problem Relation Age of Onset  . Cancer Mother        colon  . Celiac disease Mother   . Lymphoma Father   . Hypertension Father     Social History:  Social History   Social History  . Marital status: Married    Spouse name: N/A  . Number of children: N/A  . Years of education: N/A   Occupational History  . Not on file.   Social History Main Topics  . Smoking status: Former Smoker    Quit date: 1995  . Smokeless tobacco: Never Used  . Alcohol use No  . Drug use: No  . Sexual activity: Not on file   Other Topics Concern  . Not on file   Social History Narrative  . No narrative on file    Allergies:  No Known Allergies  Medications: Prior to Admission medications   Medication Sig Start Date End Date Taking? Authorizing Provider  Cholecalciferol (VITAMIN D3) 5000 UNIT/ML LIQD Take by mouth. 09/06/10   [provider]  ciprofloxacin (CIPRO) 250 MG tablet Take 1 tablet (250 mg total) by mouth 2 (two)  times daily. 03/05/17   Hubbard Hartshorn, FNP  Coenzyme Q10 10 MG capsule Take by mouth. 09/06/10   [provider]  levothyroxine (SYNTHROID, LEVOTHROID) 125 MCG tablet Take 125 mcg by mouth daily before breakfast.    [provider]  Lutein 10 MG TABS Take by mouth.    [provider]  Magnesium 100 MG CAPS Take by mouth.    [provider]  Omega-3 Fatty Acids (FISH OIL) 1000 MG CAPS Take by mouth.    [provider]  Pediatric Multivitamins-Fl (MULTIVITAMINS/FL PO) Take by mouth.    [provider]  Red Yeast Rice 600 MG TABS Take by mouth. 09/06/10   [provider]    Physical Exam Vitals:  Vitals:   04/11/17 1606  BP: (!) 154/84  Pulse: 99   No LMP recorded. Patient has had a hysterectomy.  General: NAD HEENT: normocephalic, anicteric Pulmonary: No increased work of breathing Abdomen: Soft, non-tender, non-distended.  Umbilicus without lesions.  No hepatomegaly, splenomegaly or masses palpable. No evidence of hernia  Genitourinary:  External: Normal external female genitalia.  Normal urethral meatus, normal  Bartholin's and Skene's glands.    Vagina: Normal vaginal mucosa, moderate cystocele    Cervix: Grossly normal in appearance, no bleeding  Uterus: Non-enlarged, mobile, normal contour.  No CMT  Adnexa: ovaries non-enlarged, no adnexal masses  Rectal: deferred  Lymphatic: no evidence of inguinal lymphadenopathy Extremities: no edema, erythema, or tenderness Neurologic: Grossly intact Psychiatric: mood appropriate, affect full  Pessary was cleaned and replaced  Female chaperone present for pelvic portions of the physical exam  Assessment: 71 y.o. [id number] pessary reinsertion  Plan: Problem List Items Addressed This Visit    None    Visit Diagnoses    Problem with vaginal pessary, initial encounter ([place])    -  Primary      - pessary appear to fit well, if repeat expulsion I discussed sizing up - she  has follow up in 1 month but I would like her to see me or Dr. Kenton Kingfisher back in the next week to verify she is continuing to do well after re-insertion - A total of 15 minutes were spent in face-to-face contact with the patient during this encounter with over half of that time devoted to counseling and coordination of care.

## 2017-04-15 ENCOUNTER — Ambulatory Visit (INDEPENDENT_AMBULATORY_CARE_PROVIDER_SITE_OTHER): Payer: Medicare Other | Admitting: Obstetrics & Gynecology

## 2017-04-15 ENCOUNTER — Encounter: Payer: Self-pay | Admitting: Obstetrics & Gynecology

## 2017-04-15 VITALS — BP 140/80 | Ht 62.0 in | Wt 136.0 lb

## 2017-04-15 DIAGNOSIS — N8111 Cystocele, midline: Secondary | ICD-10-CM | POA: Insufficient documentation

## 2017-04-15 DIAGNOSIS — N993 Prolapse of vaginal vault after hysterectomy: Secondary | ICD-10-CM | POA: Diagnosis not present

## 2017-04-15 NOTE — Progress Notes (Signed)
  HPI:      [name] is a 71 y.o. [id number] who presents today for her pessary follow up and examination related to her pelvic floor weakening. Recent pessary ring #4 w 2 episodes of expulsion.  Did help while in briefly.  PMHx: She  has a past medical history of Hypertension and Thyroid disease. Also,  has a past surgical history that includes Abdominal hysterectomy (1980); Bunionectomy (Left, 2006); and Tubal ligation (1969)., family history includes Cancer in her mother; Celiac disease in her mother; Hypertension in her father; Lymphoma in her father.,  reports that she quit smoking about 23 years ago. She has never used smokeless tobacco. She reports that she does not drink alcohol or use drugs.  She has a current medication list which includes the following prescription(s): vitamin d3, ciprofloxacin, coenzyme q10, levothyroxine, lutein, magnesium, fish oil, pediatric multivitamins-fl, and red yeast rice. Also, has No Known Allergies.  Review of Systems  All other systems reviewed and are negative.   Objective: BP 140/80   Ht 5\' 2"  (1.575 m)   Wt 136 lb (61.7 kg)   BMI 24.87 kg/m  Physical Exam  Constitutional: She is oriented to person, place, and time. She appears well-developed and well-nourished. No distress.  Genitourinary: Vagina normal. Pelvic exam was performed with patient supine. There is no rash, tenderness or lesion on the right labia. There is no rash, tenderness or lesion on the left labia. No erythema or bleeding in the vagina.  Abdominal: Soft. She exhibits no distension. There is no tenderness.  Musculoskeletal: Normal range of motion.  Neurological: She is alert and oriented to person, place, and time. No cranial nerve deficit.  Skin: Skin is warm and dry.  Psychiatric: She has a normal mood and affect.    Pessary Care Vagina checked - without erosions - pessary #5 placed.  A/P: Pessary ring #5 placed, tol well. Gelhorn as next option if expulsion  occurs.  Barnett Applebaum, MD, [facility], [facility] 04/15/2017  2:56 PM

## 2017-05-01 ENCOUNTER — Telehealth: Payer: Self-pay

## 2017-05-01 ENCOUNTER — Ambulatory Visit: Payer: Medicare Other | Admitting: Obstetrics & Gynecology

## 2017-05-01 NOTE — Telephone Encounter (Signed)
Pt called the office and they transferred her to triage line. She stating she is having discomfort again from the pessary. It feels like her bladder is coming out again. She really needs to be seen today or tomorrow morning before noon because she takes care of her husband who has cancer.  CB# [phone]  Per Almyra Free, there are no openings today with [facility].

## 2017-05-01 NOTE — Telephone Encounter (Signed)
Have her come in that 330 spot we just vacated

## 2017-05-01 NOTE — Telephone Encounter (Signed)
PT aware, via voicemail, of the 3:30 appointment with [facility]. I told her to call back to verify. [place] aware to put her on schedule.

## 2017-05-02 ENCOUNTER — Ambulatory Visit (INDEPENDENT_AMBULATORY_CARE_PROVIDER_SITE_OTHER): Payer: Medicare Other | Admitting: Family Medicine

## 2017-05-02 ENCOUNTER — Encounter: Payer: Self-pay | Admitting: Family Medicine

## 2017-05-02 VITALS — BP 138/84 | HR 94 | Temp 98.1°F | Resp 16 | Ht 62.0 in | Wt 133.1 lb

## 2017-05-02 DIAGNOSIS — J069 Acute upper respiratory infection, unspecified: Secondary | ICD-10-CM | POA: Diagnosis not present

## 2017-05-02 DIAGNOSIS — H109 Unspecified conjunctivitis: Secondary | ICD-10-CM

## 2017-05-02 MED ORDER — ERYTHROMYCIN 5 MG/GM OP OINT: 1.0000 "application " | TOPICAL_OINTMENT | Freq: Three times a day (TID) | OPHTHALMIC | 0 refills | Status: DC

## 2017-05-02 MED ORDER — FLUTICASONE PROPIONATE 50 MCG/ACT NA SUSP: 2.0000 | Freq: Every day | NASAL | 6 refills | Status: DC

## 2017-05-02 NOTE — Patient Instructions (Addendum)
Please use over the counter Mucinex for chest congestion/cough. Drink plenty of water.  Viral Respiratory Infection A viral respiratory infection is an illness that affects parts of the body used for breathing, like the lungs, nose, and throat. It is caused by a germ called a virus. Some examples of this kind of infection are:  A cold.  The flu (influenza).  A respiratory syncytial virus (RSV) infection.  How do I know if I have this infection? Most of the time this infection causes:  A stuffy or runny nose.  Yellow or green fluid in the nose.  A cough.  Sneezing.  Tiredness (fatigue).  Achy muscles.  A sore throat.  Sweating or chills.  A fever.  A headache.  How is this infection treated? If the flu is diagnosed early, it may be treated with an antiviral medicine. This medicine shortens the length of time a person has symptoms. Symptoms may be treated with over-the-counter and prescription medicines, such as:  Expectorants. These make it easier to cough up mucus.  Decongestant nasal sprays.  Doctors do not prescribe antibiotic medicines for viral infections. They do not work with this kind of infection. How do I know if I should stay home? To keep others from getting sick, stay home if you have:  A fever.  A lasting cough.  A sore throat.  A runny nose.  Sneezing.  Muscles aches.  Headaches.  Tiredness.  Weakness.  Chills.  Sweating.  An upset stomach (nausea).  Follow these instructions at home:  Rest as much as possible.  Take over-the-counter and prescription medicines only as told by your doctor.  Drink enough fluid to keep your pee (urine) clear or pale yellow.  Gargle with salt water. Do this 3-4 times per day or as needed. To make a salt-water mixture, dissolve -1 tsp of salt in 1 cup of warm water. Make sure the salt dissolves all the way.  Use nose drops made from salt water. This helps with stuffiness (congestion). It also  helps soften the skin around your nose.  Do not drink alcohol.  Do not use tobacco products, including cigarettes, chewing tobacco, and e-cigarettes. If you need help quitting, ask your doctor. Get help if:  Your symptoms last for 10 days or longer.  Your symptoms get worse over time.  You have a fever.  You have very bad pain in your face or forehead.  Parts of your jaw or neck become very swollen. Get help right away if:  You feel pain or pressure in your chest.  You have shortness of breath.  You faint or feel like you will faint.  You keep throwing up (vomiting).  You feel confused. This information is not intended to replace advice given to you by your health care provider. Make sure you discuss any questions you have with your health care provider. Document Released: 09/19/2008 Document Revised: 03/14/2016 Document Reviewed: 03/15/2015 Elsevier Interactive Patient Education  2018 [employer].

## 2017-05-02 NOTE — Progress Notes (Addendum)
Name: Annette Stevens   MRN: [id number]    DOB: 1946/06/04   Date:05/02/2017       Progress Note  Subjective  Chief Complaint  Chief Complaint  Patient presents with  . Sinusitis    cough, yellow mucous  . Eye Problem    red, swollen and matted together    HPI  Pt presents with the following concerns:  Eye irritation: Was using a hose on Wednesday (2 days ago) and accidentally got water and dirt into eyes. Both eyes had a sensation of something in them, but this resolved last night. Eyes are still mildly itchy and are red and watery with symptoms worse on right, no pain, no vision changes. She woke up this morning with dried yellow exudate/matting on RIGHT lashes, no drainage on the left.  Cough/nasal congestion: Started this morning. Yellow nasal drainage, nasal congestion, coughing without production, has some mild chest tightness when coughing. Grandson and Daughter both recent sick contacts.  No sore throat, chest pain or shortness of breath, abdominal pain, NVD. Former smoker.  Patient Active Problem List   Diagnosis Date Noted  . Cystocele, midline 04/15/2017  . 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 Essential hypertension, benign 09/13/2016  . Hypothyroidism 09/13/2016  . Viral URI 09/13/2016  . Caregiver stress 09/13/2016  . Acute sinusitis 09/13/2016  . Left knee pain 09/04/2016  . Abnormal urination 04/18/2016    Social History  Substance Use Topics  . Smoking status: Former Smoker    Quit date: 1995  . Smokeless tobacco: Never Used  . Alcohol use No    Current Outpatient Prescriptions:  .  Cholecalciferol (VITAMIN D3) 5000 UNIT/ML LIQD, Take by mouth., Disp: , Rfl:  .  Coenzyme Q10 10 MG capsule, Take by mouth., Disp: , Rfl:  .  levothyroxine (SYNTHROID, LEVOTHROID) 125 MCG tablet, Take 125 mcg by mouth daily before breakfast., Disp: , Rfl:  .  Lutein 10 MG TABS, Take by mouth., Disp: , Rfl:  .  Magnesium 100 MG CAPS, Take by mouth., Disp: ,  Rfl:  .  Omega-3 Fatty Acids (FISH OIL) 1000 MG CAPS, Take by mouth., Disp: , Rfl:  .  Pediatric Multivitamins-Fl (MULTIVITAMINS/FL PO), Take by mouth., Disp: , Rfl:  .  Red Yeast Rice 600 MG TABS, Take by mouth., Disp: , Rfl:   No Known Allergies  ROS  Ten systems reviewed and is negative except as mentioned in HPI  Objective  Vitals:   05/02/17 1003  BP: 138/84  Pulse: 94  Resp: 16  Temp: 98.1 F (36.7 C)  TempSrc: Oral  SpO2: 95%  Weight: 133 lb 1.6 oz (60.4 kg)  Height: 5\' 2"  (1.575 m)    Body mass index is 24.34 kg/m.  Nursing Note and Vital Signs reviewed.  Physical Exam  Constitutional: Patient appears well-developed and well-nourished.  No distress.  HEENT: head atraumatic, normocephalic, pupils equal and reactive to light, EOM's intact, left conjunctiva WNL, right conjunctiva erythematous with clear exudate; no swelling or tenderness to either orbit.  TM's without erythema or bulging, mild maxillary tenderness, no frontal sinus pain on palpation, neck supple without lymphadenopathy, oropharynx pink and moist without exudate Cardiovascular: Normal rate, regular rhythm, S1/S2 present.  No murmur or rub heard. No BLE edema. Pulmonary/Chest: Effort normal and breath sounds clear. No respiratory distress or retractions. Psychiatric: Patient has a normal mood and affect. behavior is normal. Judgment and thought content normal.  Recent Results (from the past 2160 hour(s))  POCT urinalysis  dipstick     Status: Abnormal   Collection Time: 03/05/17  2:18 PM  Result Value Ref Range   Color, UA yellow    Clarity, UA clear    Glucose, UA negative    Bilirubin, UA negative    Ketones, UA negative    Spec Grav, UA 1.010 1.010 - 1.025   Blood, UA large    pH, UA 7.5 5.0 - 8.0   Protein, UA trace    Urobilinogen, UA negative (A) 0.2 or 1.0 E.U./dL   Nitrite, UA negative    Leukocytes, UA Trace (A) Negative  Urine Culture     Status: None   Collection Time: 03/05/17   2:45 PM  Result Value Ref Range   Organism ID, Bacteria NO GROWTH   Urinalysis, microscopic only     Status: None   Collection Time: 03/10/17 12:52 PM  Result Value Ref Range   WBC, UA NONE SEEN <=5 WBC/HPF   RBC / HPF NONE SEEN <=2 RBC/HPF   Squamous Epithelial / LPF NONE SEEN <=5 HPF   Bacteria, UA NONE SEEN NONE SEEN HPF   Crystals NONE SEEN NONE SEEN HPF   Casts NONE SEEN NONE SEEN LPF   Yeast NONE SEEN NONE SEEN HPF   Assessment & Plan  1. Viral upper respiratory infection - fluticasone (FLONASE) 50 MCG/ACT nasal spray; Place 2 sprays into both nostrils daily.  Dispense: 16 g; Refill: 6 - We discussed PO antibiotic use at length, and patient is agreeable careful watch and wait because illness has only been ongoing for 1 day and there are no clinical findings suggestive of bacterial infection present. - Declines Tessalon Perles and Albuterol inhaler, declines to use humidifier at home - Will use Mucinex OTC for chest congestion and cough. - Return if symptoms worsen or fail to improve, for 2-5 days.  We discussed return precautions and red flags as below  2. Bacterial conjunctivitis of right eye - erythromycin ophthalmic ointment; Place 1 application into the right eye 3 (three) times daily.  Dispense: 3.5 g; Refill: 0 - She will use cool compresses as needed and will contact us if not improving over the next few days.  -Red flags and when to present for emergency care or RTC including fever >101.35F, chest pain, shortness of breath, new/worsening/un-resolving symptoms, eye pain or swelling or not improving, increased sinus pain/pressure reviewed with patient at time of visit. Follow up and care instructions discussed and provided in AVS.  I have reviewed this encounter including the documentation in this note and/or discussed this patient with the Johney Maine, FNP, NP-C. I am certifying that I agree with the content of this note as supervising physician.  Steele Sizer,  MD [facility] 05/02/2017, 11:46 AM

## 2017-05-06 ENCOUNTER — Ambulatory Visit (INDEPENDENT_AMBULATORY_CARE_PROVIDER_SITE_OTHER): Payer: Medicare Other | Admitting: Obstetrics & Gynecology

## 2017-05-06 ENCOUNTER — Encounter: Payer: Self-pay | Admitting: Obstetrics & Gynecology

## 2017-05-06 VITALS — BP 148/90 | HR 84 | Ht 62.0 in | Wt 131.0 lb

## 2017-05-06 DIAGNOSIS — N993 Prolapse of vaginal vault after hysterectomy: Secondary | ICD-10-CM | POA: Diagnosis not present

## 2017-05-06 DIAGNOSIS — N8111 Cystocele, midline: Secondary | ICD-10-CM | POA: Diagnosis not present

## 2017-05-06 NOTE — Progress Notes (Signed)
  HPI:      Ms. Irania Stevens is a 71 y.o. [id number] who presents today for her pessary follow up and examination related to her pelvic floor weakening.  Pt reports tolerating the pessary not so well with pressure, no expulsion of this type (prior expulsion Ring #4). She is voiding and defecating without difficulty. She currently has a #5 Ring pessary.  More GSI with this pessary than without.  PMHx: She  has a past medical history of Hypertension and Thyroid disease. Also,  has a past surgical history that includes Abdominal hysterectomy (1980); Bunionectomy (Left, 2006); and Tubal ligation (1969)., family history includes Cancer in her mother; Celiac disease in her mother; Hypertension in her father; Lymphoma in her father.,  reports that she quit smoking about 23 years ago. She has never used smokeless tobacco. She reports that she does not drink alcohol or use drugs.  She has a current medication list which includes the following prescription(s): vitamin d3, coenzyme q10, erythromycin, fluticasone, levothyroxine, lutein, magnesium, fish oil, pediatric multivitamins-fl, and red yeast rice. Also, has No Known Allergies.  Review of Systems  All other systems reviewed and are negative.   Objective: BP (!) 148/90   Pulse 84   Ht 5\' 2"  (1.575 m)   Wt 131 lb (59.4 kg)   BMI 23.96 kg/m  Physical Exam  Constitutional: She is oriented to person, place, and time. She appears well-developed and well-nourished. No distress.  Genitourinary: Vagina normal. Pelvic exam was performed with patient supine. There is no rash, tenderness or lesion on the right labia. There is no rash, tenderness or lesion on the left labia. No erythema or bleeding in the vagina.  Genitourinary Comments: Cuff intact/ no lesions Grade 2-3 cystocele and vag prolapse Absent uterus and cervix  Abdominal: Soft. She exhibits no distension. There is no tenderness.  Musculoskeletal: Normal range of motion.  Neurological: She is alert  and oriented to person, place, and time. No cranial nerve deficit.  Skin: Skin is warm and dry.  Psychiatric: She has a normal mood and affect.   Pessary Care Pessary removed and cleaned, but not replaced due to discomfort.  Vagina checked - without erosions - pessary GELLHORN #5 placed at this time.  A/P: Cystocele and Vag Prolapse  Pessary was cleaned and new one replaced today. Instructions given for care. Concerning symptoms to observe for are counseled to patient. Follow up scheduled for 3 months.  Barnett Applebaum, MD, [facility], [facility] 05/06/2017  10:13 AM

## 2017-05-14 ENCOUNTER — Ambulatory Visit: Payer: Medicare Other | Admitting: Obstetrics & Gynecology

## 2017-07-11 ENCOUNTER — Encounter: Payer: Self-pay | Admitting: Family Medicine

## 2017-07-11 NOTE — Telephone Encounter (Signed)
[facility] staff -- please update tetanus info in HM Thank you

## 2017-08-08 DIAGNOSIS — Z23 Encounter for immunization: Secondary | ICD-10-CM | POA: Diagnosis not present

## 2017-08-13 ENCOUNTER — Ambulatory Visit (INDEPENDENT_AMBULATORY_CARE_PROVIDER_SITE_OTHER): Payer: Medicare Other | Admitting: Obstetrics & Gynecology

## 2017-08-13 ENCOUNTER — Encounter: Payer: Self-pay | Admitting: Obstetrics & Gynecology

## 2017-08-13 VITALS — BP 150/98 | HR 100 | Ht 62.0 in | Wt 127.0 lb

## 2017-08-13 DIAGNOSIS — N8111 Cystocele, midline: Secondary | ICD-10-CM | POA: Diagnosis not present

## 2017-08-13 DIAGNOSIS — N993 Prolapse of vaginal vault after hysterectomy: Secondary | ICD-10-CM | POA: Diagnosis not present

## 2017-08-13 NOTE — Progress Notes (Signed)
  HPI:      Annette Stevens is a 71 y.o. [id number] who presents today for her pessary follow up and examination related to her pelvic floor weakening.  Pt reports tolerating the pessary well with scant vaginal bleeding (after lifting in yard) and  no vaginal discharge.  Symptoms of pelvic floor weakening have greatly improved. She is voiding and defecating without difficulty. She currently has a #5 Gellhorn pessary which does better than ring from an expulsion perspective.  PMHx: She  has a past medical history of Hypertension and Thyroid disease. Also,  has a past surgical history that includes Abdominal hysterectomy (1980); Bunionectomy (Left, 2006); and Tubal ligation (1969)., family history includes Cancer in her mother; Celiac disease in her mother; Hypertension in her father; Lymphoma in her father.,  reports that she quit smoking about 23 years ago. She has never used smokeless tobacco. She reports that she does not drink alcohol or use drugs.  She has a current medication list which includes the following prescription(s): fluticasone, vitamin d3, coenzyme q10, erythromycin, levothyroxine, lutein, magnesium, fish oil, pediatric multivitamins-fl, and red yeast rice. Also, is allergic to other.  Review of Systems  All other systems reviewed and are negative.  Objective: BP (!) 150/98   Pulse 100   Ht 5\' 2"  (1.575 m)   Wt 127 lb (57.6 kg)   BMI 23.23 kg/m  Physical Exam  Constitutional: She is oriented to person, place, and time. She appears well-developed and well-nourished. No distress.  Genitourinary: Vagina normal. Pelvic exam was performed with patient supine. There is no rash, tenderness or lesion on the right labia. There is no rash, tenderness or lesion on the left labia. No erythema or bleeding in the vagina.  Genitourinary Comments: Cuff intact/ no lesions Mild hyperemia at apex where Gellhorn rests  Absent uterus and cervix  Abdominal: Soft. She exhibits no distension. There is  no tenderness.  Musculoskeletal: Normal range of motion.  Neurological: She is alert and oriented to person, place, and time. No cranial nerve deficit.  Skin: Skin is warm and dry.  Psychiatric: She has a normal mood and affect.   Pessary Care Pessary removed and cleaned.  Vagina checked - without erosions - pessary replaced.  A/P: Pessary was cleaned and replaced today. Instructions given for care. Concerning symptoms to observe for are counseled to patient. Follow up scheduled for 3 months. Consider smaller size if she has bleeding side effects  From pessary  Barnett Applebaum, MD, [facility], [facility] 08/13/2017  10:49 AM

## 2017-11-13 ENCOUNTER — Encounter: Payer: Self-pay | Admitting: Obstetrics & Gynecology

## 2017-11-13 ENCOUNTER — Ambulatory Visit (INDEPENDENT_AMBULATORY_CARE_PROVIDER_SITE_OTHER): Payer: Medicare Other | Admitting: Obstetrics & Gynecology

## 2017-11-13 VITALS — BP 138/88 | HR 101 | Ht 62.0 in | Wt 131.0 lb

## 2017-11-13 DIAGNOSIS — N993 Prolapse of vaginal vault after hysterectomy: Secondary | ICD-10-CM | POA: Diagnosis not present

## 2017-11-13 DIAGNOSIS — N8111 Cystocele, midline: Secondary | ICD-10-CM | POA: Diagnosis not present

## 2017-11-13 NOTE — Progress Notes (Signed)
  HPI:      [name] is a 72 y.o. [id number] who presents today for her pessary follow up and examination related to her pelvic floor weakening.  Pt reports tolerating the pessary well with  no vaginal bleeding and  no vaginal discharge.  Symptoms of pelvic floor weakening have greatly improved. She is voiding and defecating with some difficulty. She currently has a #5 GELLHORN pessary.  Feels pressure posterior (on rectum) and has incomplete void at times.  PMHx: She  has a past medical history of Hypertension and Thyroid disease. Also,  has a past surgical history that includes Abdominal hysterectomy (1980); Bunionectomy (Left, 2006); and Tubal ligation (1969)., family history includes Cancer in her mother; Celiac disease in her mother; Hypertension in her father; Lymphoma in her father.,  reports that she quit smoking about 24 years ago. she has never used smokeless tobacco. She reports that she does not drink alcohol or use drugs.  She has a current medication list which includes the following prescription(s): vitamin d3, coenzyme q10, erythromycin, fluticasone, levothyroxine, lutein, magnesium, fish oil, pediatric multivitamins-fl, and red yeast rice. Also, is allergic to other.  Review of Systems  All other systems reviewed and are negative.   Objective: BP 138/88   Pulse (!) 101   Ht 5\' 2"  (1.575 m)   Wt 131 lb (59.4 kg)   BMI 23.96 kg/m  Physical Exam  Constitutional: She is oriented to person, place, and time. She appears well-developed and well-nourished. No distress.  Genitourinary: Vagina normal. Pelvic exam was performed with patient supine. There is no rash, tenderness or lesion on the right labia. There is no rash, tenderness or lesion on the left labia. No erythema or bleeding in the vagina.  Genitourinary Comments: Cuff intact/ no lesions, no bleeding or laceration or abrasion Absent uterus and cervix Gr 2 cystocele  Abdominal: Soft. She exhibits no distension. There is  no tenderness.  Musculoskeletal: Normal range of motion.  Neurological: She is alert and oriented to person, place, and time. No cranial nerve deficit.  Skin: Skin is warm and dry.  Psychiatric: She has a normal mood and affect.   Pessary Care Pessary removed and cleaned.  Vagina checked - without erosions - pessary replaced.  A/P: Pessary was cleaned and replaced with a smaller size #3 Gellhorn today. Instructions given for care. Concerning symptoms to observe for are counseled to patient. Follow up scheduled for 3 months. Surgery options dicussed if continues with side effects to pessary or incomplete response. (AR, TVT sling type surgery)  Barnett Applebaum, MD, [place], [facility] 11/13/2017  10:47 AM

## 2017-12-10 ENCOUNTER — Ambulatory Visit (INDEPENDENT_AMBULATORY_CARE_PROVIDER_SITE_OTHER): Payer: Medicare Other | Admitting: Family Medicine

## 2017-12-10 ENCOUNTER — Encounter: Payer: Self-pay | Admitting: Family Medicine

## 2017-12-10 VITALS — BP 140/82 | HR 92 | Temp 98.0°F | Resp 16 | Ht 62.0 in | Wt 133.3 lb

## 2017-12-10 DIAGNOSIS — E039 Hypothyroidism, unspecified: Secondary | ICD-10-CM

## 2017-12-10 DIAGNOSIS — D1724 Benign lipomatous neoplasm of skin and subcutaneous tissue of left leg: Secondary | ICD-10-CM | POA: Insufficient documentation

## 2017-12-10 DIAGNOSIS — R5383 Other fatigue: Secondary | ICD-10-CM

## 2017-12-10 DIAGNOSIS — M19019 Primary osteoarthritis, unspecified shoulder: Secondary | ICD-10-CM | POA: Insufficient documentation

## 2017-12-10 DIAGNOSIS — M1710 Unilateral primary osteoarthritis, unspecified knee: Secondary | ICD-10-CM | POA: Insufficient documentation

## 2017-12-10 DIAGNOSIS — L723 Sebaceous cyst: Secondary | ICD-10-CM | POA: Diagnosis not present

## 2017-12-10 DIAGNOSIS — M179 Osteoarthritis of knee, unspecified: Secondary | ICD-10-CM | POA: Insufficient documentation

## 2017-12-10 DIAGNOSIS — F4321 Adjustment disorder with depressed mood: Secondary | ICD-10-CM

## 2017-12-10 DIAGNOSIS — I10 Essential (primary) hypertension: Secondary | ICD-10-CM

## 2017-12-10 DIAGNOSIS — M25532 Pain in left wrist: Secondary | ICD-10-CM

## 2017-12-10 DIAGNOSIS — J3489 Other specified disorders of nose and nasal sinuses: Secondary | ICD-10-CM

## 2017-12-10 DIAGNOSIS — M4310 Spondylolisthesis, site unspecified: Secondary | ICD-10-CM | POA: Insufficient documentation

## 2017-12-10 NOTE — Progress Notes (Signed)
Name: Annette Stevens   MRN: [id number]    DOB: Dec 07, 1945   Date:12/10/2017       Progress Note  Subjective  Chief Complaint  Chief Complaint  Patient presents with  . Sinus Problem  . Wrist Pain    HPI  Pt presents for the following concerns:  Sinus Issues: She notes intermittent sinus congestion (worse on the LEFT nare).  She notes intermittent white and yellow discharge, thinks she has seen "polyps" in her left nare.  She reports occasional headaches, but denies sinus pain, cough, shortness of breath, chest pain, vision changes. Has been using Flonase without relief.  RIGHT Wrist Pain: Intermittent RIGHT wrist pain for a little over a week - pain is sharp and occurs only with certain movements and lasts for a few seconds. Notes a history of carpal tunnel back in the early 2000's - she doesn't have frequent flares of carpal tunnel, but never had surgery on the area.  Denies numbness or tingling, weakness.  She has been applying co-band to the wrist to help with her pain.  Thyroid Issue: She needs a TSH check - she has been on the same dose (1103mcg) for over a year now.  She endorses dry skin and some brittle hair along with fatigue; denies nail changes, palpitations, chest pain or shortness of breath. Fluctuating heat and cold intolerance.  HTN:  She does not take medication - she prefers to do things naturally.  BP is just at goal today.  Denies chest pain, shortness of breath, palpitations, headaches, vision changes, or BLE edema.  She avoids salt in her diet, does not add salt at the table either.   Fatigue and Grieving: Lost her husband 06/20/2017 and is still grieving his loss.  She notes trouble sleeping and fatigue. She would like labs checked today.  Lipoma: She notes a lipoma on the LEFT left just below her posterior knee and another on the scalp ongoing for over a year and would like a dermatologist for evaluation.  These have not changed over the last year, but do sometimes wax and  wane in size.  Neither are itchy, both have intermittent pain.  Patient Active Problem List   Diagnosis Date Noted  . Cystocele, midline 04/15/2017  . 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 Essential hypertension, benign 09/13/2016  . Hypothyroidism 09/13/2016  . Viral URI 09/13/2016  . Caregiver stress 09/13/2016  . Acute sinusitis 09/13/2016  . Left knee pain 09/04/2016  . Abnormal urination 04/18/2016    Past Surgical History:  Procedure Laterality Date  . ABDOMINAL HYSTERECTOMY  1980  . BUNIONECTOMY Left 2006  . TUBAL LIGATION  1969    Family History  Problem Relation Age of Onset  . Cancer Mother        colon  . Celiac disease Mother   . Lymphoma Father   . Hypertension Father     Social History   Socioeconomic History  . Marital status: Widowed    Spouse name: Not on file  . Number of children: Not on file  . Years of education: Not on file  . Highest education level: Not on file  Social Needs  . Financial resource strain: Not on file  . Food insecurity - worry: Not on file  . Food insecurity - inability: Not on file  . Transportation needs - medical: Not on file  . Transportation needs - non-medical: Not on file  Occupational History  . Not on file  Tobacco Use  .  Smoking status: Former Smoker    Last attempt to quit: 1995    Years since quitting: 24.1  . Smokeless tobacco: Never Used  Substance and Sexual Activity  . Alcohol use: No  . Drug use: No  . Sexual activity: Not on file  Other Topics Concern  . Not on file  Social History Narrative  . Not on file     Current Outpatient Medications:  .  Cholecalciferol (VITAMIN D3) 5000 UNIT/ML LIQD, Take by mouth., Disp: , Rfl:  .  Coenzyme Q10 10 MG capsule, Take by mouth., Disp: , Rfl:  .  fluticasone (FLONASE) 50 MCG/ACT nasal spray, Place 2 sprays into both nostrils daily., Disp: 16 g, Rfl: 6 .  Levothyroxine Sodium 100 MCG CAPS, Take 100 mcg by mouth daily before breakfast.  , Disp: , Rfl:  .  Omega-3 Fatty Acids (FISH OIL) 1000 MG CAPS, Take by mouth., Disp: , Rfl:  .  Pediatric Multivitamins-Fl (MULTIVITAMINS/FL PO), Take by mouth., Disp: , Rfl:  .  erythromycin ophthalmic ointment, Place 1 application into the right eye 3 (three) times daily. (Patient not taking: Reported on 12/10/2017), Disp: 3.5 g, Rfl: 0 .  Lutein 10 MG TABS, Take by mouth., Disp: , Rfl:  .  Magnesium 100 MG CAPS, Take by mouth., Disp: , Rfl:  .  Red Yeast Rice 600 MG TABS, Take by mouth., Disp: , Rfl:   Allergies  Allergen Reactions  . Other Other (See Comments)    Blood pressure medications    ROS Ten systems reviewed and is negative except as mentioned in HPI  Objective  Vitals:   12/10/17 1340  BP: 140/82  Pulse: 92  Resp: 16  Temp: 98 F (36.7 C)  TempSrc: Oral  SpO2: 97%  Weight: 133 lb 4.8 oz (60.5 kg)  Height: 5\' 2"  (1.575 m)    Body mass index is 24.38 kg/m.  Physical Exam  Constitutional: Patient appears well-developed and well-nourished. No distress.  HENT: Head: Normocephalic and atraumatic. Ears: B TMs ok, no erythema or effusion; Nose: Nose - possible deviated septum to the LEFT side - nare appears closed and mildly erythematous. Mouth/Throat: Oropharynx is clear and moist. No oropharyngeal exudate. Eyes: Conjunctivae and EOM are normal. Pupils are equal, round, and reactive to light. No scleral icterus.  Neck: Normal range of motion. Neck supple. No JVD present. No thyromegaly present.  Cardiovascular: Normal rate, regular rhythm and normal heart sounds.  No murmur heard. No BLE edema. Pulmonary/Chest: Effort normal and breath sounds normal. No respiratory distress. Musculoskeletal: Normal range of motion, no joint effusions. No gross deformities. Negative Phalen's and Tinel's to bilateral wrists. Neurological: she is alert and oriented to person, place, and time. No cranial nerve deficit. Coordination, balance, strength, speech and gait are normal  bilaterally.  Skin: Skin is warm and dry. No rash noted. No erythema. Sebaceous cyst to LEFT posterior scalp - non-tender, hardened, non-erythematous.  Lipoma to LEFT LE just below the popliteal fossa - soft, moveable, non-tender, non-erythematous. Psychiatric: Patient has a an anxious mood and affect. behavior is appropriate. Judgment and thought content normal.  No results found for this or any previous visit (from the past 72 hour(s)).  PHQ2/9: Depression screen [facility] 2/9 12/10/2017 09/03/2016  Decreased Interest 0 0  Down, Depressed, Hopeless 0 0  PHQ - 2 Score 0 0   Fall Risk: Fall Risk  12/10/2017 09/03/2016  Falls in the past year? No Yes  Number falls in past yr: - 1  Injury  with Fall? - No  Risk for fall due to : Impaired balance/gait -   Assessment & Plan  1. Nasal and sinus discharge - Ambulatory referral to ENT 2. Nasal discomfort - Ambulatory referral to ENT  3. Hypothyroidism, unspecified type - TSH  4. Essential hypertension, benign - BASIC METABOLIC PANEL WITH GFR  5. Fatigue, unspecified type - TSH - BASIC METABOLIC PANEL WITH GFR - CBC  6. Grieving - She does not want to seek Hospice Grief counseling or private counseling at this time.  7. Lipoma of left lower extremity - Ambulatory referral to Dermatology  8. Sebaceous cyst - Ambulatory referral to Dermatology  9. Left wrist pain - Due to short course and intermittent nature of the pain, I recommend conservative measures at this time - discussed RICE with pt in detail.  She does not want to take tylenol, but will follow RICE directions.  Will consider referral to hand surgeon if pain worsens or persists.

## 2017-12-10 NOTE — Patient Instructions (Addendum)
Hypertension Hypertension is another name for high blood pressure. High blood pressure forces your heart to work harder to pump blood. This can cause problems over time. There are two numbers in a blood pressure reading. There is a top number (systolic) over a bottom number (diastolic). It is best to have a blood pressure below 120/80. Healthy choices can help lower your blood pressure. You may need medicine to help lower your blood pressure if:  Your blood pressure cannot be lowered with healthy choices.  Your blood pressure is higher than 130/80.  Follow these instructions at home: Eating and drinking  If directed, follow the DASH eating plan. This diet includes: ? Filling half of your plate at each meal with fruits and vegetables. ? Filling one quarter of your plate at each meal with whole grains. Whole grains include whole wheat pasta, brown rice, and whole grain bread. ? Eating or drinking low-fat dairy products, such as skim milk or low-fat yogurt. ? Filling one quarter of your plate at each meal with low-fat (lean) proteins. Low-fat proteins include fish, skinless chicken, eggs, beans, and tofu. ? Avoiding fatty meat, cured and processed meat, or chicken with skin. ? Avoiding premade or processed food.  Eat less than 1,500 mg of salt (sodium) a day.  Limit alcohol use to no more than 1 drink a day for nonpregnant women and 2 drinks a day for men. One drink equals 12 oz of beer, 5 oz of wine, or 1 oz of hard liquor. Lifestyle  Work with your doctor to stay at a healthy weight or to lose weight. Ask your doctor what the best weight is for you.  Get at least 30 minutes of exercise that causes your heart to beat faster (aerobic exercise) most days of the week. This may include walking, swimming, or biking.  Get at least 30 minutes of exercise that strengthens your muscles (resistance exercise) at least 3 days a week. This may include lifting weights or pilates.  Do not use any  products that contain nicotine or tobacco. This includes cigarettes and e-cigarettes. If you need help quitting, ask your doctor.  Check your blood pressure at home as told by your doctor.  Keep all follow-up visits as told by your doctor. This is important. Medicines  Take over-the-counter and prescription medicines only as told by your doctor. Follow directions carefully.  Do not skip doses of blood pressure medicine. The medicine does not work as well if you skip doses. Skipping doses also puts you at risk for problems.  Ask your doctor about side effects or reactions to medicines that you should watch for. Contact a doctor if:  You think you are having a reaction to the medicine you are taking.  You have headaches that keep coming back (recurring).  You feel dizzy.  You have swelling in your ankles.  You have trouble with your vision. Get help right away if:  You get a very bad headache.  You start to feel confused.  You feel weak or numb.  You feel faint.  You get very bad pain in your: ? Chest. ? Belly (abdomen).  You throw up (vomit) more than once.  You have trouble breathing. Summary  Hypertension is another name for high blood pressure.  Making healthy choices can help lower blood pressure. If your blood pressure cannot be controlled with healthy choices, you may need to take medicine. This information is not intended to replace advice given to you by your health care   provider. Make sure you discuss any questions you have with your health care provider. Document Released: 03/25/2008 Document Revised: 09/04/2016 Document Reviewed: 09/04/2016 Elsevier Interactive Patient Education  2018 [employer] for Routine Care of Injuries Many injuries can be cared for using rest, ice, compression, and elevation (RICE therapy). Using RICE therapy can help to lessen pain and swelling. It can help your body to heal. Rest Reduce your normal activities and avoid  using the injured part of your body. You can go back to your normal activities when you feel okay and your doctor says it is okay. Ice Do not put ice on your bare skin.  Put ice in a plastic bag.  Place a towel between your skin and the bag.  Leave the ice on for 20 minutes, 2-3 times a day.  Do this for as long as told by your doctor. Compression Compression means putting pressure on the injured area. This can be done with an elastic bandage. If an elastic bandage has been applied:  Remove and reapply the bandage every 3-4 hours or as told by your doctor.  Make sure the bandage is not wrapped too tight. Wrap the bandage more loosely if part of your body beyond the bandage is blue, swollen, cold, painful, or loses feeling (numb).  See your doctor if the bandage seems to make your problems worse.  Elevation Elevation means keeping the injured area raised. Raise the injured area above your heart or the center of your chest if you can. When should I get help? You should get help if:  You keep having pain and swelling.  Your symptoms get worse.  Get help right away if: You should get help right away if:  You have sudden bad pain at or below the area of your injury.  You have redness or more swelling around your injury.  You have tingling or numbness at or below the injury that does not go away when you take off the bandage.  This information is not intended to replace advice given to you by your health care provider. Make sure you discuss any questions you have with your health care provider. Document Released: 03/25/2008 Document Revised: 09/03/2016 Document Reviewed: 09/14/2014 Elsevier Interactive Patient Education  2017 [employer].

## 2017-12-11 ENCOUNTER — Encounter: Payer: Self-pay | Admitting: Family Medicine

## 2017-12-11 LAB — CBC
HEMATOCRIT: 47.3 % — AB (ref 35.0–45.0)
HEMOGLOBIN: 15.7 g/dL — AB (ref 11.7–15.5)
MCH: 28.2 pg (ref 27.0–33.0)
MCHC: 33.2 g/dL (ref 32.0–36.0)
MCV: 85.1 fL (ref 80.0–100.0)
MPV: 11 fL (ref 7.5–12.5)
Platelets: 306 10*3/uL (ref 140–400)
RBC: 5.56 10*6/uL — AB (ref 3.80–5.10)
RDW: 12.6 % (ref 11.0–15.0)
WBC: 7.3 10*3/uL (ref 3.8–10.8)

## 2017-12-11 LAB — BASIC METABOLIC PANEL WITH GFR
BUN: 10 mg/dL (ref 7–25)
CALCIUM: 9.8 mg/dL (ref 8.6–10.4)
CO2: 26 mmol/L (ref 20–32)
Chloride: 102 mmol/L (ref 98–110)
Creat: 0.71 mg/dL (ref 0.60–0.93)
GFR, EST AFRICAN AMERICAN: 99 mL/min/{1.73_m2} (ref >=60)
GFR, EST NON AFRICAN AMERICAN: 85 mL/min/{1.73_m2} (ref >=60)
Glucose, Bld: 131 mg/dL (ref 65–139)
POTASSIUM: 3.9 mmol/L (ref 3.5–5.3)
Sodium: 140 mmol/L (ref 135–146)

## 2017-12-11 LAB — TSH: TSH: 1.34 m[IU]/L (ref 0.40–4.50)

## 2017-12-15 ENCOUNTER — Encounter: Payer: Self-pay | Admitting: Family Medicine

## 2017-12-15 DIAGNOSIS — E039 Hypothyroidism, unspecified: Secondary | ICD-10-CM

## 2017-12-15 MED ORDER — LEVOTHYROXINE SODIUM 200 MCG PO TABS: 100.0000 ug | ORAL_TABLET | Freq: Every day | ORAL | 0 refills | Status: DC

## 2017-12-16 ENCOUNTER — Telehealth: Payer: Self-pay | Admitting: Family Medicine

## 2017-12-16 ENCOUNTER — Telehealth: Payer: Self-pay

## 2017-12-16 NOTE — Telephone Encounter (Signed)
Per the request of Dr. Enid Derry, I tried to contact this patient to confirm that she has been informed of her appointment with Dr. Clyde Canterbury at [facility] on Thursday, January 01, 2018 at 3:00pm, but there was no answer.  A message was left with that information on it and their number in case she needed to reschedule.

## 2017-12-16 NOTE — Telephone Encounter (Signed)
Received notifications from [place] that her current manufacturer for Synthroid is on backorder and they need to change to a different manufacturer.  This can sometimes cause a change in the thyroid level as her body adjusts to the different manufacturer's synthroid, therefore she needs to come in 6 weeks for TSH recheck.  Please notify [facility] [place] to switch manufacturers.

## 2017-12-16 NOTE — Telephone Encounter (Signed)
-----   Message from Arnetha Courser, MD sent at 12/16/2017  8:08 AM EST ----- Regarding: RE: Please make pt aware (if not already) Sorry if this wasn't linked. I sent the note linked to an ENT appt scanned in media section dated 12/12/17. Please see that scanned document. Thank you.  ----- Message ----- From: Dennard Schaumann, CMA Sent: 12/15/2017   9:40 AM To: Arnetha Courser, MD Subject: RE: Please make pt aware (if not already)      Which appt are you referring to?  ----- Message ----- From: Arnetha Courser, MD Sent: 12/13/2017   3:37 PM To: Dennard Schaumann, CMA Subject: Please make pt aware (if not already)          Please make sure pt knows about appt (if not already notified). Thank you for all you do! :-)  ----- Message ----- From: Ilsa Iha Sent: 12/12/2017   4:08 PM To: Arnetha Courser, MD

## 2017-12-17 NOTE — Telephone Encounter (Signed)
Levothyroxine is the generic name for synthroid. The medication was not called in wrong.  Her usual manufacturer was on backorder and a new manufacturer was used per pharmacy request, so there may be a difference in the branding and/or the pill color/shape.

## 2017-12-17 NOTE — Telephone Encounter (Signed)
Spoke to patient and she stated the script was called in wrong, it was called in for Levothyroxine instead of synthroid. She already has script

## 2017-12-17 NOTE — Telephone Encounter (Signed)
Left message for patient to call office.  

## 2018-01-01 DIAGNOSIS — D385 Neoplasm of uncertain behavior of other respiratory organs: Secondary | ICD-10-CM | POA: Diagnosis not present

## 2018-01-01 DIAGNOSIS — J329 Chronic sinusitis, unspecified: Secondary | ICD-10-CM | POA: Diagnosis not present

## 2018-01-01 DIAGNOSIS — J339 Nasal polyp, unspecified: Secondary | ICD-10-CM | POA: Diagnosis not present

## 2018-01-20 DIAGNOSIS — D2261 Melanocytic nevi of right upper limb, including shoulder: Secondary | ICD-10-CM | POA: Diagnosis not present

## 2018-01-20 DIAGNOSIS — D2272 Melanocytic nevi of left lower limb, including hip: Secondary | ICD-10-CM | POA: Diagnosis not present

## 2018-01-20 DIAGNOSIS — D225 Melanocytic nevi of trunk: Secondary | ICD-10-CM | POA: Diagnosis not present

## 2018-01-20 DIAGNOSIS — D2262 Melanocytic nevi of left upper limb, including shoulder: Secondary | ICD-10-CM | POA: Diagnosis not present

## 2018-01-20 DIAGNOSIS — L738 Other specified follicular disorders: Secondary | ICD-10-CM | POA: Diagnosis not present

## 2018-01-20 DIAGNOSIS — L821 Other seborrheic keratosis: Secondary | ICD-10-CM | POA: Diagnosis not present

## 2018-01-20 DIAGNOSIS — L7211 Pilar cyst: Secondary | ICD-10-CM | POA: Diagnosis not present

## 2018-01-20 DIAGNOSIS — D2271 Melanocytic nevi of right lower limb, including hip: Secondary | ICD-10-CM | POA: Diagnosis not present

## 2018-02-02 DIAGNOSIS — R208 Other disturbances of skin sensation: Secondary | ICD-10-CM | POA: Diagnosis not present

## 2018-02-02 DIAGNOSIS — L7211 Pilar cyst: Secondary | ICD-10-CM | POA: Diagnosis not present

## 2018-02-05 ENCOUNTER — Telehealth: Payer: Self-pay | Admitting: Family Medicine

## 2018-02-05 NOTE — Telephone Encounter (Signed)
Copied from [place] [id number]. Topic: Quick Communication - See Telephone Encounter >> Feb 05, 2018  9:46 AM Ivar Drape wrote: CRM for notification. See Telephone encounter for: 02/05/18. E R R O R - Patient decided to send a note via mychart

## 2018-02-06 ENCOUNTER — Ambulatory Visit: Payer: Medicare Other | Admitting: Family Medicine

## 2018-02-09 ENCOUNTER — Encounter: Payer: Self-pay | Admitting: Family Medicine

## 2018-02-09 DIAGNOSIS — E039 Hypothyroidism, unspecified: Secondary | ICD-10-CM

## 2018-02-09 MED ORDER — LEVOTHYROXINE SODIUM 200 MCG PO TABS: 100.0000 ug | ORAL_TABLET | Freq: Every day | ORAL | 3 refills | Status: DC

## 2018-02-11 DIAGNOSIS — J329 Chronic sinusitis, unspecified: Secondary | ICD-10-CM | POA: Diagnosis not present

## 2018-02-11 DIAGNOSIS — D385 Neoplasm of uncertain behavior of other respiratory organs: Secondary | ICD-10-CM | POA: Diagnosis not present

## 2018-02-11 DIAGNOSIS — J339 Nasal polyp, unspecified: Secondary | ICD-10-CM | POA: Diagnosis not present

## 2018-02-16 ENCOUNTER — Encounter: Payer: Self-pay | Admitting: Obstetrics & Gynecology

## 2018-02-16 ENCOUNTER — Ambulatory Visit (INDEPENDENT_AMBULATORY_CARE_PROVIDER_SITE_OTHER): Payer: Medicare Other | Admitting: Obstetrics & Gynecology

## 2018-02-16 VITALS — BP 158/90 | Ht 62.0 in | Wt 136.0 lb

## 2018-02-16 DIAGNOSIS — N952 Postmenopausal atrophic vaginitis: Secondary | ICD-10-CM | POA: Diagnosis not present

## 2018-02-16 DIAGNOSIS — N993 Prolapse of vaginal vault after hysterectomy: Secondary | ICD-10-CM | POA: Diagnosis not present

## 2018-02-16 DIAGNOSIS — N8111 Cystocele, midline: Secondary | ICD-10-CM | POA: Diagnosis not present

## 2018-02-16 NOTE — Progress Notes (Signed)
  HPI:      Ms. Shevon Sian is a 72 y.o. [id number] who presents today for her pessary follow up and examination related to her pelvic floor weakening.  Pt reports tolerating the pessary well with  no vaginal bleeding and  no vaginal discharge.  Symptoms of pelvic floor weakening have greatly improved. She is voiding and defecating without difficulty. She currently has a #3 Gellhorn pessary (change in size from previous pessary last visit 3 mos ago).  PMHx: She  has a past medical history of Hypertension and Thyroid disease. Also,  has a past surgical history that includes Abdominal hysterectomy (1980); Bunionectomy (Left, 2006); and Tubal ligation (1969)., family history includes Cancer in her mother; Celiac disease in her mother; Hypertension in her father; Lymphoma in her father.,  reports that she quit smoking about 24 years ago. She has never used smokeless tobacco. She reports that she does not drink alcohol or use drugs.  She has a current medication list which includes the following prescription(s): vitamin d3, coenzyme q10, fluticasone, levothyroxine, lutein, multivitamin, fish oil, and pediatric multivitamins-fl. Also, is allergic to other.  Review of Systems  All other systems reviewed and are negative.  Objective: BP (!) 158/90   Ht 5\' 2"  (1.575 m)   Wt 136 lb (61.7 kg)   BMI 24.87 kg/m  Physical Exam  Constitutional: She is oriented to person, place, and time. She appears well-developed and well-nourished. No distress.  Genitourinary: Vagina normal. Pelvic exam was performed with patient supine. There is no rash, tenderness or lesion on the right labia. There is no rash, tenderness or lesion on the left labia. No erythema or bleeding in the vagina.  Genitourinary Comments: Cuff intact/ no lesions, no bleeding or laceration or abrasion Absent uterus and cervix Gr 2 cystocele   Abdominal: Soft. She exhibits no distension. There is no tenderness.  Musculoskeletal: Normal range of  motion.  Neurological: She is alert and oriented to person, place, and time. No cranial nerve deficit.  Skin: Skin is warm and dry.  Psychiatric: She has a normal mood and affect.   Pessary Care Pessary removed and cleaned.  Vagina checked - without erosions - pessary replaced.  A/P: Pessary was cleaned and replaced today. Instructions given for care. Concerning symptoms to observe for are counseled to patient. Follow up scheduled for 3 months. Considering surgery in future (AR, TVT) Declines colonoscopy and MMG  Barnett Applebaum, MD, [place], [facility] 02/16/2018  10:31 AM

## 2018-02-23 NOTE — Discharge Instructions (Signed)
[facility] °[facility] °ENDOSCOPIC SINUS SURGERY °[facility] ° °What is Functional Endoscopic Sinus Surgery? ° The Surgery involves making the natural openings of the sinuses larger by removing the bony partitions that separate the sinuses from the nasal cavity.  The natural sinus lining is preserved as much as possible to allow the sinuses to resume normal function after the surgery.  In some patients nasal polyps (excessively swollen lining of the sinuses) may be removed to relieve obstruction of the sinus openings.  The surgery is performed through the nose using lighted scopes, which eliminates the need for incisions on the face.  A septoplasty is a different procedure which is sometimes performed with sinus surgery.  It involves straightening the boy partition that separates the two sides of your nose.  A crooked or deviated septum may need repair if is obstructing the sinuses or nasal airflow.  Turbinate reduction is also often performed during sinus surgery.  The turbinates are bony proturberances from the side walls of the nose which swell and can obstruct the nose in patients with sinus and allergy problems.  Their size can be surgically reduced to help relieve nasal obstruction. ° °What Can Sinus Surgery Do For Me? ° Sinus surgery can reduce the frequency of sinus infections requiring antibiotic treatment.  This can provide improvement in nasal congestion, post-nasal drainage, facial pressure and nasal obstruction.  Surgery will NOT prevent you from ever having an infection again, so it usually only for patients who get infections 4 or more times yearly requiring antibiotics, or for infections that do not clear with antibiotics.  It will not cure nasal allergies, so patients with allergies may still require medication to treat their allergies after surgery. Surgery may improve headaches related to sinusitis, however, some people will continue to  require medication to control sinus headaches related to allergies.  Surgery will do nothing for other forms of headache (migraine, tension or cluster). ° °What Are the Risks of Endoscopic Sinus Surgery? ° Current techniques allow surgery to be performed safely with little risk, however, there are rare complications that patients should be aware of.  Because the sinuses are located around the eyes, there is risk of eye injury, including blindness, though again, this would be quite rare. This is usually a result of bleeding behind the eye during surgery, which puts the vision oat risk, though there are treatments to protect the vision and prevent permanent disrupted by surgery causing a leak of the spinal fluid that surrounds the brain.  More serious complications would include bleeding inside the brain cavity or damage to the brain.  Again, all of these complications are uncommon, and spinal fluid leaks can be safely managed surgically if they occur.  The most common complication of sinus surgery is bleeding from the nose, which may require packing or cauterization of the nose.  Continued sinus have polyps may experience recurrence of the polyps requiring revision surgery.  Alterations of sense of smell or injury to the tear ducts are also rare complications.  ° °What is the Surgery Like, and what is the Recovery? ° The Surgery usually takes a couple of hours to perform, and is usually performed under a general anesthetic (completely asleep).  Patients are usually discharged home after a couple of hours.  Sometimes during surgery it is necessary to pack the nose to control bleeding, and the packing is left in place for 24 - 48 hours, and removed by your surgeon.    If a septoplasty was performed during the procedure, there is often a splint placed which must be removed after 5-7 days.   °Discomfort: Pain is usually mild to moderate, and can be controlled by prescription pain medication or acetaminophen (Tylenol).   Aspirin, Ibuprofen (Advil, Motrin), or Naprosyn (Aleve) should be avoided, as they can cause increased bleeding.  Most patients feel sinus pressure like they have a bad head cold for several days.  Sleeping with your head elevated can help reduce swelling and facial pressure, as can ice packs over the face.  A humidifier may be helpful to keep the mucous and blood from drying in the nose.  ° °Diet: There are no specific diet restrictions, however, you should generally start with clear liquids and a light diet of bland foods because the anesthetic can cause some nausea.  Advance your diet depending on how your stomach feels.  Taking your pain medication with food will often help reduce stomach upset which pain medications can cause. ° °Nasal Saline Irrigation: It is important to remove blood clots and dried mucous from the nose as it is healing.  This is done by having you irrigate the nose at least 3 - 4 times daily with a salt water solution.  We recommend using NeilMed Sinus Rinse (available at the drug store).  Fill the squeeze bottle with the solution, bend over a sink, and insert the tip of the squeeze bottle into the nose ½ of an inch.  Point the tip of the squeeze bottle towards the inside corner of the eye on the same side your irrigating.  Squeeze the bottle and gently irrigate the nose.  If you bend forward as you do this, most of the fluid will flow back out of the nose, instead of down your throat.   The solution should be warm, near body temperature, when you irrigate.   Each time you irrigate, you should use a full squeeze bottle.  ° °Note that if you are instructed to use Nasal Steroid Sprays at any time after your surgery, irrigate with saline BEFORE using the steroid spray, so you do not wash it all out of the nose. °Another product, Nasal Saline Gel (such as AYR Nasal Saline Gel) can be applied in each nostril 3 - 4 times daily to moisture the nose and reduce scabbing or crusting. ° °Bleeding:   Bloody drainage from the nose can be expected for several days, and patients are instructed to irrigate their nose frequently with salt water to help remove mucous and blood clots.  The drainage may be dark red or brown, though some fresh blood may be seen intermittently, especially after irrigation.  Do not blow you nose, as bleeding may occur. If you must sneeze, keep your mouth open to allow air to escape through your mouth. ° °If heavy bleeding occurs: Irrigate the nose with saline to rinse out clots, then spray the nose 3 - 4 times with Afrin Nasal Decongestant Spray.  The spray will constrict the blood vessels to slow bleeding.  Pinch the lower half of your nose shut to apply pressure, and lay down with your head elevated.  Ice packs over the nose may help as well. If bleeding persists despite these measures, you should notify your doctor.  Do not use the Afrin routinely to control nasal congestion after surgery, as it can result in worsening congestion and may affect healing.  ° ° ° °Activity: Return to work varies among patients. Most patients will be   out of work at least 5 - 7 days to recover.  Patient may return to work after they are off of narcotic pain medication, and feeling well enough to perform the functions of their job.  Patients must avoid heavy lifting (over 10 pounds) or strenuous physical for 2 weeks after surgery, so your employer may need to assign you to light duty, or keep you out of work longer if light duty is not possible.  NOTE: you should not drive, operate dangerous machinery, do any mentally demanding tasks or make any important legal or financial decisions while on narcotic pain medication and recovering from the general anesthetic.  °  °Call Your Doctor Immediately if You Have Any of the Following: °1. Bleeding that you cannot control with the above measures °2. Loss of vision, double vision, bulging of the eye or black eyes. °3. Fever over 101 degrees °4. Neck stiffness with  severe headache, fever, nausea and change in mental state. °You are always encourage to call anytime with concerns, however, please call with requests for pain medication refills during office hours. ° °Office Endoscopy: During follow-up visits your doctor will remove any packing or splints that may have been placed and evaluate and clean your sinuses endoscopically.  Topical anesthetic will be used to make this as comfortable as possible, though you may want to take your pain medication prior to the visit.  How often this will need to be done varies from patient to patient.  After complete recovery from the surgery, you may need follow-up endoscopy from time to time, particularly if there is concern of recurrent infection or nasal polyps. ° ° °General Anesthesia, Adult, Care After °These instructions provide you with information about caring for yourself after your procedure. Your health care provider may also give you more specific instructions. Your treatment has been planned according to current medical practices, but problems sometimes occur. Call your health care provider if you have any problems or questions after your procedure. °What can I expect after the procedure? °After the procedure, it is common to have: °· Vomiting. °· A sore throat. °· Mental slowness. ° °It is common to feel: °· Nauseous. °· Cold or shivery. °· Sleepy. °· Tired. °· Sore or achy, even in parts of your body where you did not have surgery. ° °Follow these instructions at home: °For at least 24 hours after the procedure: °· Do not: °? Participate in activities where you could fall or become injured. °? Drive. °? Use heavy machinery. °? Drink alcohol. °? Take sleeping pills or medicines that cause drowsiness. °? Make important decisions or sign legal documents. °? Take care of children on your own. °· Rest. °Eating and drinking °· If you vomit, drink water, juice, or soup when you can drink without vomiting. °· Drink enough fluid to  keep your urine clear or pale yellow. °· Make sure you have little or no nausea before eating solid foods. °· Follow the diet recommended by your health care provider. °General instructions °· Have a responsible adult stay with you until you are awake and alert. °· Return to your normal activities as told by your health care provider. Ask your health care provider what activities are safe for you. °· Take over-the-counter and prescription medicines only as told by your health care provider. °· If you smoke, do not smoke without supervision. °· Keep all follow-up visits as told by your health care provider. This is important. °Contact a health care provider if: °· You   continue to have nausea or vomiting at home, and medicines are not helpful. °· You cannot drink fluids or start eating again. °· You cannot urinate after 8-12 hours. °· You develop a skin rash. °· You have fever. °· You have increasing redness at the site of your procedure. °Get help right away if: °· You have difficulty breathing. °· You have chest pain. °· You have unexpected bleeding. °· You feel that you are having a life-threatening or urgent problem. °This information is not intended to replace advice given to you by your health care provider. Make sure you discuss any questions you have with your health care provider. °Document Released: 01/13/2001 Document Revised: 03/11/2016 Document Reviewed: 09/21/2015 °Elsevier Interactive Patient Education © 2018 [employer] ° °

## 2018-02-24 ENCOUNTER — Ambulatory Visit: Payer: Medicare Other | Admitting: Anesthesiology

## 2018-02-24 ENCOUNTER — Encounter
Admission: RE | Disposition: A | Discharge status: Home / Self Care | Payer: Self-pay | Source: Ambulatory Visit | Attending: Otolaryngology

## 2018-02-24 ENCOUNTER — Ambulatory Visit
Admission: RE | Admit: 2018-02-24 | Discharge: 2018-02-24 | Disposition: A | Discharge status: Home / Self Care | Payer: Medicare Other | Source: Ambulatory Visit | Attending: Otolaryngology | Admitting: Otolaryngology

## 2018-02-24 DIAGNOSIS — Z79890 Hormone replacement therapy (postmenopausal): Secondary | ICD-10-CM | POA: Diagnosis not present

## 2018-02-24 DIAGNOSIS — I10 Essential (primary) hypertension: Secondary | ICD-10-CM | POA: Insufficient documentation

## 2018-02-24 DIAGNOSIS — J323 Chronic sphenoidal sinusitis: Secondary | ICD-10-CM | POA: Diagnosis not present

## 2018-02-24 DIAGNOSIS — J321 Chronic frontal sinusitis: Secondary | ICD-10-CM | POA: Diagnosis not present

## 2018-02-24 DIAGNOSIS — J324 Chronic pansinusitis: Secondary | ICD-10-CM | POA: Diagnosis not present

## 2018-02-24 DIAGNOSIS — J339 Nasal polyp, unspecified: Secondary | ICD-10-CM | POA: Diagnosis not present

## 2018-02-24 DIAGNOSIS — D140 Benign neoplasm of middle ear, nasal cavity and accessory sinuses: Secondary | ICD-10-CM | POA: Diagnosis not present

## 2018-02-24 DIAGNOSIS — J329 Chronic sinusitis, unspecified: Secondary | ICD-10-CM | POA: Diagnosis not present

## 2018-02-24 DIAGNOSIS — J338 Other polyp of sinus: Secondary | ICD-10-CM | POA: Diagnosis not present

## 2018-02-24 DIAGNOSIS — Z7951 Long term (current) use of inhaled steroids: Secondary | ICD-10-CM | POA: Diagnosis not present

## 2018-02-24 DIAGNOSIS — Z79899 Other long term (current) drug therapy: Secondary | ICD-10-CM | POA: Insufficient documentation

## 2018-02-24 DIAGNOSIS — Z87891 Personal history of nicotine dependence: Secondary | ICD-10-CM | POA: Diagnosis not present

## 2018-02-24 DIAGNOSIS — J322 Chronic ethmoidal sinusitis: Secondary | ICD-10-CM | POA: Diagnosis not present

## 2018-02-24 DIAGNOSIS — J320 Chronic maxillary sinusitis: Secondary | ICD-10-CM | POA: Diagnosis not present

## 2018-02-24 HISTORY — PX: SPHENOIDECTOMY: SHX2421

## 2018-02-24 HISTORY — PX: IMAGE GUIDED SINUS SURGERY: SHX6570

## 2018-02-24 HISTORY — PX: ETHMOIDECTOMY: SHX5197

## 2018-02-24 HISTORY — PX: FRONTAL SINUS EXPLORATION: SHX6591

## 2018-02-24 HISTORY — PX: MAXILLARY ANTROSTOMY: SHX2003

## 2018-02-24 SURGERY — SINUS SURGERY, WITH IMAGING GUIDANCE
Anesthesia: General | Wound class: Clean Contaminated

## 2018-02-24 MED ORDER — DEXAMETHASONE SODIUM PHOSPHATE 4 MG/ML IJ SOLN
INTRAMUSCULAR | Status: DC | PRN
  Administered 2018-02-24: 4 mg via INTRAVENOUS

## 2018-02-24 MED ORDER — EPHEDRINE SULFATE 50 MG/ML IJ SOLN
INTRAMUSCULAR | Status: DC | PRN
  Administered 2018-02-24: 10 mg via INTRAVENOUS

## 2018-02-24 MED ORDER — PROPOFOL 10 MG/ML IV BOLUS
INTRAVENOUS | Status: DC | PRN
  Administered 2018-02-24: 200 mg via INTRAVENOUS

## 2018-02-24 MED ORDER — FENTANYL CITRATE (PF) 100 MCG/2ML IJ SOLN: 25.0000 ug | INTRAMUSCULAR | Status: DC | PRN

## 2018-02-24 MED ORDER — NEOSTIGMINE METHYLSULFATE 10 MG/10ML IV SOLN
INTRAVENOUS | Status: DC | PRN
  Administered 2018-02-24: 3 mg via INTRAVENOUS

## 2018-02-24 MED ORDER — LIDOCAINE HCL (CARDIAC) PF 100 MG/5ML IV SOSY
PREFILLED_SYRINGE | INTRAVENOUS | Status: DC | PRN
  Administered 2018-02-24: 20 mg via INTRAVENOUS

## 2018-02-24 MED ORDER — GLYCOPYRROLATE 0.2 MG/ML IJ SOLN
INTRAMUSCULAR | Status: DC | PRN
  Administered 2018-02-24: 0.1 mg via INTRAVENOUS
  Administered 2018-02-24: 0.4 mg via INTRAVENOUS

## 2018-02-24 MED ORDER — OXYMETAZOLINE HCL 0.05 % NA SOLN
NASAL | Status: DC | PRN
  Administered 2018-02-24: 4 via TOPICAL

## 2018-02-24 MED ORDER — LIDOCAINE-EPINEPHRINE 1 %-1:100000 IJ SOLN
INTRAMUSCULAR | Status: DC | PRN
  Administered 2018-02-24: 14 mL

## 2018-02-24 MED ORDER — ONDANSETRON HCL 4 MG/2ML IJ SOLN
INTRAMUSCULAR | Status: DC | PRN
  Administered 2018-02-24: 4 mg via INTRAVENOUS

## 2018-02-24 MED ORDER — ACETAMINOPHEN 10 MG/ML IV SOLN
1000.0000 mg | Freq: Once | INTRAVENOUS | Status: AC
  Administered 2018-02-24: 1000 mg via INTRAVENOUS

## 2018-02-24 MED ORDER — HYDROCODONE-ACETAMINOPHEN 5-325 MG PO TABS: 1.0000 | ORAL_TABLET | Freq: Four times a day (QID) | ORAL | 0 refills | Status: DC | PRN

## 2018-02-24 MED ORDER — DEXMEDETOMIDINE HCL 200 MCG/2ML IV SOLN
INTRAVENOUS | Status: DC | PRN
  Administered 2018-02-24 (×2): 4 ug via INTRAVENOUS

## 2018-02-24 MED ORDER — OXYCODONE HCL 5 MG/5ML PO SOLN: 5.0000 mg | Freq: Once | ORAL | Status: DC | PRN

## 2018-02-24 MED ORDER — ROCURONIUM BROMIDE 100 MG/10ML IV SOLN
INTRAVENOUS | Status: DC | PRN
  Administered 2018-02-24: 40 mg via INTRAVENOUS

## 2018-02-24 MED ORDER — MIDAZOLAM HCL 5 MG/5ML IJ SOLN
INTRAMUSCULAR | Status: DC | PRN
  Administered 2018-02-24: 2 mg via INTRAVENOUS

## 2018-02-24 MED ORDER — ONDANSETRON HCL 4 MG/2ML IJ SOLN
4.0000 mg | Freq: Once | INTRAMUSCULAR | Status: AC
  Administered 2018-02-24: 4 mg via INTRAVENOUS

## 2018-02-24 MED ORDER — OXYCODONE HCL 5 MG PO TABS: 5.0000 mg | ORAL_TABLET | Freq: Once | ORAL | Status: DC | PRN

## 2018-02-24 MED ORDER — LACTATED RINGERS IV SOLN
INTRAVENOUS | Status: DC
  Administered 2018-02-24 (×3): via INTRAVENOUS

## 2018-02-24 MED ORDER — CEFDINIR 300 MG PO CAPS: 300.0000 mg | ORAL_CAPSULE | Freq: Two times a day (BID) | ORAL | 0 refills | Status: DC

## 2018-02-24 MED ORDER — FENTANYL CITRATE (PF) 100 MCG/2ML IJ SOLN
INTRAMUSCULAR | Status: DC | PRN
  Administered 2018-02-24 (×2): 50 ug via INTRAVENOUS
  Administered 2018-02-24: 100 ug via INTRAVENOUS

## 2018-02-24 SURGICAL SUPPLY — 24 items
BATTERY INSTRU NAVIGATION (MISCELLANEOUS) ×12 IMPLANT
CANISTER SUCT 1200ML W/VALVE (MISCELLANEOUS) ×4 IMPLANT
COAG SUCT 10F 3.5MM HAND CTRL (MISCELLANEOUS) ×4 IMPLANT
DRAPE HEAD BAR (DRAPES) ×4 IMPLANT
DRESSING NASL FOAM PST OP SINU (MISCELLANEOUS) IMPLANT
DRSG NASAL 4CM NASOPORE (MISCELLANEOUS) ×4 IMPLANT
DRSG NASAL FOAM POST OP SINU (MISCELLANEOUS)
ELECT REM PT RETURN 9FT ADLT (ELECTROSURGICAL) ×4
ELECTRODE REM PT RTRN 9FT ADLT (ELECTROSURGICAL) ×2 IMPLANT
GLOVE BIO SURGEON STRL SZ7.5 (GLOVE) ×8 IMPLANT
IV NS 500ML (IV SOLUTION) ×2
IV NS 500ML BAXH (IV SOLUTION) ×2 IMPLANT
KIT TURNOVER KIT A (KITS) ×4 IMPLANT
NS IRRIG 500ML POUR BTL (IV SOLUTION) ×4 IMPLANT
PACK DRAPE NASAL/ENT (PACKS) ×4 IMPLANT
PACKING NASAL EPIS 4X2.4 XEROG (MISCELLANEOUS) ×8 IMPLANT
PATTIES SURGICAL .5 X3 (DISPOSABLE) ×4 IMPLANT
SHAVER DIEGO BLD STD TYPE A (BLADE) ×4 IMPLANT
SOL ANTI-FOG 6CC FOG-OUT (MISCELLANEOUS) ×2 IMPLANT
SOL FOG-OUT ANTI-FOG 6CC (MISCELLANEOUS) ×2
SYRINGE 10CC LL (SYRINGE) ×4 IMPLANT
TRACKER CRANIALMASK (MASK) ×4 IMPLANT
TUBING DECLOG MULTIDEBRIDER (TUBING) ×4 IMPLANT
WATER STERILE IRR 250ML POUR (IV SOLUTION) ×4 IMPLANT

## 2018-02-24 NOTE — H&P (Signed)
History and physical reviewed and will be scanned in later. No change in medical status reported by the patient or family, appears stable for surgery. All questions regarding the procedure answered, and patient (or family if a child) expressed understanding of the procedure. ? ?Annette Stevens S Annette Stevens ?[email or web address]@ ?

## 2018-02-24 NOTE — Anesthesia Postprocedure Evaluation (Signed)
Anesthesia Post Note  Patient: Annette Stevens  Procedure(s) Performed: IMAGE GUIDED SINUS SURGERY (N/A ) ENDSOCPIC MAXILLARY ANTROSTOMY TOTAL (Left ) ETHMOIDECTOMY (Left ) FRONTAL SINUS EXPLORATION (Left ) SPHENOIDECTOMY (Left )  Patient location during evaluation: PACU Anesthesia Type: General Level of consciousness: awake and alert Pain management: pain level controlled Vital Signs Assessment: post-procedure vital signs reviewed and stable Respiratory status: spontaneous breathing Cardiovascular status: blood pressure returned to baseline Postop Assessment: no headache Anesthetic complications: no    Jaci Standard, III,  Tiberius Loftus D

## 2018-02-24 NOTE — Op Note (Signed)
02/24/2018  2:20 PM    [name]  [id number]   Pre-Op Diagnosis: Left nasal polypoid mass, chronic sinusitis  Post-op Diagnosis: Same  Procedure:  1)  Image Guided Sinus Surgery,   2)  Left Endoscopic Maxillary Antrostomy with Tissue Removal   3)  Left Frontal Sinusotomy   4)  Left Total Ethmoidectomy   5)  Left Sphenoidotomy    Surgeon:  Riley Nearing  Anesthesia:  General endotracheal  EBL:  735HG  Complications:  None  Findings: Large flashy mass in nasal cavity protruding from the left maxillary sinus. Appears based supero-laterally from region around a bony protuberance in the posterior maxillary sinus. All gross disease removed. More typical inflammatory polypoid change noted in the ethmoids and frontal recess were not contiguous with the main mass. Mild polypoid edema in the sphenoid ostium region.   Procedure: After the patient was identified in holding and the benefits of the procedure were reviewed as well as the consent and risks, the patient was taken to the operating room and with the patient in a comfortable supine position,  general orotracheal anesthesia was induced without difficulty.  A proper time-out was performed.  The Stryker image guidance system was set up and calibrated in the normal fashion and felt to be acceptable.  Next 1% Xylocaine with 1:100,000 epinephrine was infiltrated into the inferior turbinate, polypoid mass, and anterior middle turbinate on the left.  Several minutes were allowed for this to take effect.  Cottoniod pledgets soaked in Afrin were placed into the left nasal cavity and left while the patient was prepped and draped in the standard fashion. The image guided suction was calibrated and used to inspect known points in the nasal cavity to assess accuracy of the image guided system. Accuracy was felt to be excellent.   The mass was then debrided, initially using forceps, obtained plenty of tissue for diagnosis. The mass was noted to be  fleshy with a papillary gross appearance, and was coming from the middle meatus, apparently protruding from the maxillary sinus, lateralizing the middle turbinate and the uncinate process.The polyp was further debrided with the microdebrider. Next the uncinate process then resected with through-cutting forceps as well as the microdebrider. In this fashion the uncinate was completely removed and further soft tissue was removed. A right angle suction was passed into the maxillary sinus and used to get behind the bulk of the mass and pull it from the sinus. After further debridement it was noted to be attached to a stalk along the posterior/superior/lateral aspect of the sinus. There was some arterial bleeding from the stalk which was stopped with suction cautery. The soft tissue was then completely removed from this region until only healthy appearing mucosa remained at the margins. The stalk was attached along a bony tubercle posteriorly, and care was taken to remove all tissue from around this region. The 70 degree scope was used for visualization during this part of the procedure.  Next the left anterior ethmoid sinuses were dissected beginning inferomedially, entering the ethmoid bulla. Thru cut forceps were used to open the anterior ethmoids. The microdebrider was used as needed to trim loose mucosal edges.  Next the basal lamella was entered and, working back in a sequential fashion through the ethmoid air cels, the ethmoid sinuses were dissected to the posterior ethmoid sinuses, utilizing the image guided suction and the whole time to reassess the anatomy frequently. Thru cutting forceps were used for this dissection. Care was taken to avoid injury  to the lamina papyracea laterally and the skull base superiorly.   Dissection proceeded anteriorly and superiorly into the left frontal recess which was dissected utilizing a 30 and then a 70 scope and frontal curved instruments. The curved image guided  suction was used during this dissection to frequently reassess the anatomy on the CT scan. The frontal recess was dissected until a suction could be passed up into the region of the frontal sinus.   Next the scope was passed medial to the middle turbinate and the left sphenoid recess inspected. With the assistance of the image guided system, the sphenoid sinus os was partially blocked by polypoid edema. A suction was passed through the os and mucoid secretions suctioned. The debrider was passed into the os and used to removed polypoid mucosa infero-medially to open the os.  The nose was suctioned and inspected. Residual bleeding was controlled with suction cautery. The maxillary sinus was irrigated with saline. Stammberger absorbable sinus packing was then placed in the ethmoid cavity and maxillary, followed by Zerogel absorbable packing.   The patient was then returned to the anesthesiologist for awakening and taken to recovery room in good condition postoperatively.  Disposition:   PACU and d/c home  Plan: Ice, elevation, narcotic analgesia and prophylactic antibiotics. Begin sinus irrigations with saline tomrrow, irrigating 3-4 times daily. Return to the office in 7 days.  Return to work in 7-10 days, no strenuous activities for two weeks.   Riley Nearing 02/24/2018 2:20 PM

## 2018-02-24 NOTE — Transfer of Care (Signed)
Immediate Anesthesia Transfer of Care Note  Patient: Annette Stevens  Procedure(s) Performed: IMAGE GUIDED SINUS SURGERY (N/A ) ENDSOCPIC MAXILLARY ANTROSTOMY TOTAL (Left ) ETHMOIDECTOMY (Left ) FRONTAL SINUS EXPLORATION (Left ) SPHENOIDECTOMY (Left )  Patient Location: PACU  Anesthesia Type: General ETT  Level of Consciousness: awake, alert  and patient cooperative  Airway and Oxygen Therapy: Patient Spontanous Breathing and Patient connected to supplemental oxygen  Post-op Assessment: Post-op Vital signs reviewed, Patient's Cardiovascular Status Stable, Respiratory Function Stable, Patent Airway and No signs of Nausea or vomiting  Post-op Vital Signs: Reviewed and stable  Complications: No apparent anesthesia complications

## 2018-02-24 NOTE — Anesthesia Procedure Notes (Signed)
Procedure Name: Intubation Date/Time: 02/24/2018 12:39 PM Performed by: Lind Guest, CRNA Pre-anesthesia Checklist: Patient identified, Patient being monitored, Timeout performed, Emergency Drugs available and Suction available Patient Re-evaluated:Patient Re-evaluated prior to induction Oxygen Delivery Method: Circle system utilized Preoxygenation: Pre-oxygenation with 100% oxygen Induction Type: IV induction Ventilation: Mask ventilation without difficulty Laryngoscope Size: Mac and 3 Grade View: Grade I Tube type: Oral Rae Tube size: 7.0 mm Number of attempts: 1 Airway Equipment and Method: Stylet Placement Confirmation: ETT inserted through vocal cords under direct vision,  positive ETCO2 and breath sounds checked- equal and bilateral Secured at: 21 cm Tube secured with: Tape Dental Injury: Teeth and Oropharynx as per pre-operative assessment

## 2018-02-24 NOTE — Anesthesia Preprocedure Evaluation (Signed)
Anesthesia Evaluation  Patient identified by MRN, date of birth, ID band Patient awake    Reviewed: Allergy & Precautions, H&P , NPO status , Patient's Chart, lab work & pertinent test results  Airway Mallampati: II  TM Distance: >3 FB Neck ROM: full    Dental no notable dental hx.    Pulmonary former smoker,    Pulmonary exam normal        Cardiovascular hypertension, On Medications Normal cardiovascular exam     Neuro/Psych    GI/Hepatic negative GI ROS, Neg liver ROS,   Endo/Other  negative endocrine ROS  Renal/GU negative Renal ROS     Musculoskeletal   Abdominal   Peds  Hematology negative hematology ROS (+)   Anesthesia Other Findings   Reproductive/Obstetrics negative OB ROS                             Anesthesia Physical Anesthesia Plan  ASA: II  Anesthesia Plan: General ETT   Post-op Pain Management:    Induction:   PONV Risk Score and Plan:   Airway Management Planned:   Additional Equipment:   Intra-op Plan:   Post-operative Plan:   Informed Consent: I have reviewed the patients History and Physical, chart, labs and discussed the procedure including the risks, benefits and alternatives for the proposed anesthesia with the patient or authorized representative who has indicated his/her understanding and acceptance.     Plan Discussed with:   Anesthesia Plan Comments:         Anesthesia Quick Evaluation

## 2018-02-25 ENCOUNTER — Encounter: Payer: Self-pay | Admitting: Otolaryngology

## 2018-02-26 LAB — SURGICAL PATHOLOGY

## 2018-03-02 DIAGNOSIS — D140 Benign neoplasm of middle ear, nasal cavity and accessory sinuses: Secondary | ICD-10-CM | POA: Diagnosis not present

## 2018-03-18 DIAGNOSIS — D140 Benign neoplasm of middle ear, nasal cavity and accessory sinuses: Secondary | ICD-10-CM | POA: Diagnosis not present

## 2018-04-08 DIAGNOSIS — D140 Benign neoplasm of middle ear, nasal cavity and accessory sinuses: Secondary | ICD-10-CM | POA: Diagnosis not present

## 2018-05-18 ENCOUNTER — Encounter: Payer: Self-pay | Admitting: Obstetrics & Gynecology

## 2018-05-18 ENCOUNTER — Ambulatory Visit (INDEPENDENT_AMBULATORY_CARE_PROVIDER_SITE_OTHER): Payer: Medicare Other | Admitting: Obstetrics & Gynecology

## 2018-05-18 VITALS — BP 140/90 | HR 91 | Ht 62.0 in | Wt 136.0 lb

## 2018-05-18 DIAGNOSIS — N952 Postmenopausal atrophic vaginitis: Secondary | ICD-10-CM

## 2018-05-18 DIAGNOSIS — N8111 Cystocele, midline: Secondary | ICD-10-CM | POA: Diagnosis not present

## 2018-05-18 DIAGNOSIS — N993 Prolapse of vaginal vault after hysterectomy: Secondary | ICD-10-CM

## 2018-05-18 NOTE — Progress Notes (Signed)
  HPI:      [name] is a 72 y.o. [id number] who presents today for her pessary follow up and examination related to her pelvic floor weakening.  Pt reports tolerating the pessary well with rare vaginal bleeding and no vaginal discharge.  Symptoms of pelvic floor weakening have greatly improved. She is voiding and defecating without difficulty. She currently has a 3 Gellhorn pessary.  PMHx: She  has a past medical history of Hypertension and Thyroid disease. Also,  has a past surgical history that includes Abdominal hysterectomy (1980); Bunionectomy (Left, 2006); Tubal ligation (1969); Image guided sinus surgery (N/A, 02/24/2018); Maxillary antrostomy (Left, 02/24/2018); Ethmoidectomy (Left, 02/24/2018); Frontal sinus exploration (Left, 02/24/2018); and Sphenoidectomy (Left, 02/24/2018)., family history includes Cancer in her mother; Celiac disease in her mother; Hypertension in her father; Lymphoma in her father.,  reports that she quit smoking about 24 years ago. She has never used smokeless tobacco. She reports that she does not drink alcohol or use drugs.  She has a current medication list which includes the following prescription(s): acetaminophen, cefdinir, vitamin d3, coenzyme q10, fluticasone, hydrocodone-acetaminophen, levothyroxine, lutein, multivitamin, fish oil, and pediatric multivitamins-fl. Also, is allergic to other.  Review of Systems  All other systems reviewed and are negative.   Objective: BP 140/90   Pulse 91   Ht 5\' 2"  (1.575 m)   Wt 136 lb (61.7 kg)   BMI 24.87 kg/m  Physical Exam  Constitutional: She is oriented to person, place, and time. She appears well-developed and well-nourished. No distress.  Genitourinary: Vagina normal. Pelvic exam was performed with patient supine. There is no rash, tenderness or lesion on the right labia. There is no rash, tenderness or lesion on the left labia. No erythema or bleeding in the vagina.  Genitourinary Comments: Cuff intact/ no  lesions  Absent uterus and cervix  HENT:  Head: Normocephalic and atraumatic.  Nose: Nose normal.  Mouth/Throat: Oropharynx is clear and moist.  Abdominal: Soft. She exhibits no distension. There is no tenderness.  Musculoskeletal: Normal range of motion.  Neurological: She is alert and oriented to person, place, and time. No cranial nerve deficit.  Skin: Skin is warm and dry.  Psychiatric: She has a normal mood and affect.   Pessary Care Pessary removed and cleaned.  Vagina checked - without erosions - pessary replaced.  A/P: 1. Prolapse of vaginal vault after hysterectomy 2. Vaginal atrophy 3. Cystocele, midline Pessary was cleaned and replaced today. Instructions given for care. Concerning symptoms to observe for are counseled to patient. Follow up scheduled for 3 months.  A total of 15 minutes were spent face-to-face with the patient during this encounter and over half of that time dealt with counseling and coordination of care.  Barnett Applebaum, MD, [facility], [facility] 05/18/2018  11:45 AM

## 2018-08-06 DIAGNOSIS — H6121 Impacted cerumen, right ear: Secondary | ICD-10-CM | POA: Diagnosis not present

## 2018-08-06 DIAGNOSIS — K116 Mucocele of salivary gland: Secondary | ICD-10-CM | POA: Diagnosis not present

## 2018-08-06 DIAGNOSIS — D140 Benign neoplasm of middle ear, nasal cavity and accessory sinuses: Secondary | ICD-10-CM | POA: Diagnosis not present

## 2018-08-06 DIAGNOSIS — J340 Abscess, furuncle and carbuncle of nose: Secondary | ICD-10-CM | POA: Diagnosis not present

## 2018-08-17 ENCOUNTER — Encounter: Payer: Self-pay | Admitting: Obstetrics & Gynecology

## 2018-08-17 ENCOUNTER — Ambulatory Visit (INDEPENDENT_AMBULATORY_CARE_PROVIDER_SITE_OTHER): Payer: Medicare Other | Admitting: Obstetrics & Gynecology

## 2018-08-17 VITALS — BP 158/90 | Ht 62.0 in | Wt 138.0 lb

## 2018-08-17 DIAGNOSIS — N8111 Cystocele, midline: Secondary | ICD-10-CM | POA: Diagnosis not present

## 2018-08-17 DIAGNOSIS — N952 Postmenopausal atrophic vaginitis: Secondary | ICD-10-CM | POA: Diagnosis not present

## 2018-08-17 DIAGNOSIS — N993 Prolapse of vaginal vault after hysterectomy: Secondary | ICD-10-CM

## 2018-08-17 NOTE — Progress Notes (Signed)
  HPI:      [name] is a 72 y.o. [id number] who presents today for her pessary follow up and examination related to her pelvic floor weakening.  Pt reports tolerating the pessary well with  no vaginal bleeding and  no vaginal discharge.  Symptoms of pelvic floor weakening have greatly improved. She is voiding and defecating without difficulty. She currently has a GELLHORN 3 pessary.  PMHx: She  has a past medical history of Hypertension and Thyroid disease. Also,  has a past surgical history that includes Abdominal hysterectomy (1980); Bunionectomy (Left, 2006); Tubal ligation (1969); Image guided sinus surgery (N/A, 02/24/2018); Maxillary antrostomy (Left, 02/24/2018); Ethmoidectomy (Left, 02/24/2018); Frontal sinus exploration (Left, 02/24/2018); and Sphenoidectomy (Left, 02/24/2018)., family history includes Cancer in her mother; Celiac disease in her mother; Hypertension in her father; Lymphoma in her father.,  reports that she quit smoking about 24 years ago. She has never used smokeless tobacco. She reports that she does not drink alcohol or use drugs.  She has a current medication list which includes the following prescription(s): acetaminophen, cefdinir, vitamin d3, coenzyme q10, fluticasone, hydrocodone-acetaminophen, levothyroxine, lutein, multivitamin, fish oil, and pediatric multivitamins-fl. Also, is allergic to other.  Review of Systems  All other systems reviewed and are negative.  Objective: BP (!) 158/90   Ht 5\' 2"  (1.575 m)   Wt 138 lb (62.6 kg)   BMI 25.24 kg/m  Physical Exam  Constitutional: She is oriented to person, place, and time. She appears well-developed and well-nourished. No distress.  Genitourinary: Vagina normal. Pelvic exam was performed with patient supine. There is no rash, tenderness or lesion on the right labia. There is no rash, tenderness or lesion on the left labia. No erythema or bleeding in the vagina.  Genitourinary Comments: Cuff intact/ no lesions Absent  uterus and cervix  HENT:  Head: Normocephalic and atraumatic.  Nose: Nose normal.  Mouth/Throat: Oropharynx is clear and moist.  Abdominal: Soft. She exhibits no distension. There is no tenderness.  Musculoskeletal: Normal range of motion.  Neurological: She is alert and oriented to person, place, and time. No cranial nerve deficit.  Skin: Skin is warm and dry.  Psychiatric: She has a normal mood and affect.   Pessary Care Pessary removed and cleaned.  Vagina checked - without erosions - pessary replaced.  A/P:. 1.Prolapse of vaginal vault after hysterectomy 2. Vaginal atrophy 3. Cystocele, midline Surgery vs pessary options discussed  Pessary was cleaned and replaced today. Instructions given for care. Concerning symptoms to observe for are counseled to patient. Follow up scheduled for 3 months.  A total of 15 minutes were spent face-to-face with the patient during this encounter and over half of that time dealt with counseling and coordination of care.  Barnett Applebaum, MD, [facility], [facility] 08/17/2018  10:21 AM

## 2018-08-31 DIAGNOSIS — Z23 Encounter for immunization: Secondary | ICD-10-CM | POA: Diagnosis not present

## 2018-11-17 ENCOUNTER — Encounter: Payer: Self-pay | Admitting: Obstetrics & Gynecology

## 2018-11-17 ENCOUNTER — Ambulatory Visit (INDEPENDENT_AMBULATORY_CARE_PROVIDER_SITE_OTHER): Payer: Medicare Other | Admitting: Obstetrics & Gynecology

## 2018-11-17 VITALS — BP 148/82 | Ht 62.0 in | Wt 137.0 lb

## 2018-11-17 DIAGNOSIS — N8111 Cystocele, midline: Secondary | ICD-10-CM | POA: Diagnosis not present

## 2018-11-17 DIAGNOSIS — N952 Postmenopausal atrophic vaginitis: Secondary | ICD-10-CM

## 2018-11-17 DIAGNOSIS — N993 Prolapse of vaginal vault after hysterectomy: Secondary | ICD-10-CM

## 2018-11-17 NOTE — Progress Notes (Signed)
  HPI:      [name] is a 73 y.o. [id number] who presents today for her pessary follow up and examination related to her pelvic floor weakening.  Pt reports tolerating the pessary well with  no vaginal bleeding and  no vaginal discharge.  Symptoms of pelvic floor weakening have greatly improved. She is voiding and defecating without difficulty. She currently has a Gellhorn #3 pessary.  PMHx: She  has a past medical history of Hypertension and Thyroid disease. Also,  has a past surgical history that includes Abdominal hysterectomy (1980); Bunionectomy (Left, 2006); Tubal ligation (1969); Image guided sinus surgery (N/A, 02/24/2018); Maxillary antrostomy (Left, 02/24/2018); Ethmoidectomy (Left, 02/24/2018); Frontal sinus exploration (Left, 02/24/2018); and Sphenoidectomy (Left, 02/24/2018)., family history includes Cancer in her mother; Celiac disease in her mother; Hypertension in her father; Lymphoma in her father.,  reports that she quit smoking about 25 years ago. She has never used smokeless tobacco. She reports that she does not drink alcohol or use drugs.  She has a current medication list which includes the following prescription(s): acetaminophen, cefdinir, vitamin d3, coenzyme q10, fluticasone, hydrocodone-acetaminophen, levothyroxine, lutein, multivitamin, fish oil, and pediatric multivitamins-fl. Also, is allergic to other.  ROS  Objective: BP (!) 148/82   Ht 5\' 2"  (1.575 m)   Wt 137 lb (62.1 kg)   BMI 25.06 kg/m  Physical Exam Constitutional:      General: She is not in acute distress.    Appearance: She is well-developed.  Genitourinary:     Pelvic exam was performed with patient supine.     Vagina normal.     No vaginal erythema or bleeding.     Genitourinary Comments: Cuff intact/ no lesions Prolapse of tissues noted Absent uterus and cervix  HENT:     Head: Normocephalic and atraumatic.     Nose: Nose normal.  Abdominal:     General: There is no distension.     Palpations:  Abdomen is soft.     Tenderness: There is no abdominal tenderness.  Musculoskeletal: Normal range of motion.  Neurological:     Mental Status: She is alert and oriented to person, place, and time.     Cranial Nerves: No cranial nerve deficit.  Skin:    General: Skin is warm and dry.   Pessary Care Pessary removed and cleaned.  Vagina checked - without erosions - pessary replaced.  A/P: 1. Prolapse of vaginal vault after hysterectomy 2. Cystocele, midline 3. Vaginal atrophy Pessary was cleaned and replaced today. Instructions given for care. Concerning symptoms to observe for are counseled to patient. Follow up scheduled for 3 months.  A total of 15 minutes were spent face-to-face with the patient during this encounter and over half of that time dealt with counseling and coordination of care.  Barnett Applebaum, MD, [facility], [facility] 11/17/2018  11:05 AM

## 2018-11-18 ENCOUNTER — Ambulatory Visit (INDEPENDENT_AMBULATORY_CARE_PROVIDER_SITE_OTHER): Payer: Medicare Other | Admitting: Family Medicine

## 2018-11-18 ENCOUNTER — Encounter: Payer: Self-pay | Admitting: Family Medicine

## 2018-11-18 VITALS — BP 136/78 | HR 93 | Temp 98.5°F | Resp 12 | Ht 62.0 in | Wt 136.8 lb

## 2018-11-18 DIAGNOSIS — M4300 Spondylolysis, site unspecified: Secondary | ICD-10-CM

## 2018-11-18 DIAGNOSIS — M5432 Sciatica, left side: Secondary | ICD-10-CM | POA: Diagnosis not present

## 2018-11-18 DIAGNOSIS — M21621 Bunionette of right foot: Secondary | ICD-10-CM

## 2018-11-18 DIAGNOSIS — M25511 Pain in right shoulder: Secondary | ICD-10-CM | POA: Diagnosis not present

## 2018-11-18 DIAGNOSIS — M2011 Hallux valgus (acquired), right foot: Secondary | ICD-10-CM

## 2018-11-18 DIAGNOSIS — R51 Headache: Secondary | ICD-10-CM

## 2018-11-18 DIAGNOSIS — M5136 Other intervertebral disc degeneration, lumbar region: Secondary | ICD-10-CM | POA: Diagnosis not present

## 2018-11-18 DIAGNOSIS — M4316 Spondylolisthesis, lumbar region: Secondary | ICD-10-CM | POA: Diagnosis not present

## 2018-11-18 DIAGNOSIS — G8929 Other chronic pain: Secondary | ICD-10-CM | POA: Insufficient documentation

## 2018-11-18 DIAGNOSIS — M21611 Bunion of right foot: Secondary | ICD-10-CM | POA: Diagnosis not present

## 2018-11-18 DIAGNOSIS — Z0100 Encounter for examination of eyes and vision without abnormal findings: Secondary | ICD-10-CM

## 2018-11-18 DIAGNOSIS — D485 Neoplasm of uncertain behavior of skin: Secondary | ICD-10-CM | POA: Diagnosis not present

## 2018-11-18 DIAGNOSIS — M19079 Primary osteoarthritis, unspecified ankle and foot: Secondary | ICD-10-CM | POA: Diagnosis not present

## 2018-11-18 DIAGNOSIS — Z1159 Encounter for screening for other viral diseases: Secondary | ICD-10-CM

## 2018-11-18 NOTE — Patient Instructions (Signed)
We'll have you see the specialists and get you to the physical therapists

## 2018-11-18 NOTE — Progress Notes (Signed)
BP 136/78   Pulse 93   Temp 98.5 F (36.9 C) (Oral)   Resp 12   Ht 5\' 2"  (1.575 m)   Wt 136 lb 12.8 oz (62.1 kg)   SpO2 95%   BMI 25.02 kg/m    Subjective:    Patient ID: Annette Stevens, female    DOB: 1945/12/31, 73 y.o.   MRN: [id number]  HPI: Annette Stevens is a 73 y.o. female  Chief Complaint  Patient presents with  . Headache    had fall last year hit head  . Bunions    wants referral for podiatry  . Referral    eye exam  . Back Pain    wants mri, has several problems  . Shoulder Pain    right  . Referral    derm    HPI  Back Pain Patient endorses chronic back pain  Related to spondylosis of L4-L5, L5-S1 that is worsening. States it is now going down left leg and getting more shooting pains down toes and more weakness. States some morning she gets up and doesn't even walk. Takes advil at night to help her sleep. Has not seen an orthopedist for this since she has been in [place].  Most recent was 2002, DDD at L4-L5, grade I spondylolisthesis at L4-L5, no canal stenosis, extensive DDD of L4-L5; she says they already said grade 2 and now she thinks it is going into grade 3; limited hip flexion on the LEFT; weakness is getting better, "not fast enough"  Right Shoulder Pain Patient has had chronic right shoulder pain since 2011. States had overuse injury then, saw orthopedist a few years ago and received a cortisone joint injection without any relief of symptoms. Was told she had arthritis in the shoulder. States her shoulder pain has worsened and she has limited range of motion. Hears "clunking" noise when she moves arm up. States has been trying to do some stretching at home with minimal relief. She saw orthopaedist, Dr. Tamala Julian; did an xray; had injection of steroids, did not help  Bunions Patient has had bunions removed by podiatry before on left foot states now has some on right foot and would like referral. Has tried OTC treatments  Headaches March 2019 was on  the toilet and felt she was going to pass out so tried to get up and passed out for a few seconds but had hit head on weighing scale. States since then has been getting intermittent headaches- mild pain. States previously she did not get headaches. They last a few seconds to a few minutes and then go away. States no associated tinnitus, blurry vision, nausea, photophobia, phonophobia. Gets these headaches maybe 4-5 times a month. States feels like a dull ache.  Skin Condition Patient also notes bumps on forehead started with just two bumps but has been increasing since then and would like to see a dermatologist. Saw dermatologist in the past tried to open some of them caused more injury per patient, and then instructed to use retinol but it dries skin out  Depression screen [facility] 2/9 11/18/2018 12/10/2017 09/03/2016  Decreased Interest - 0 0  Down, Depressed, Hopeless 0 0 0  PHQ - 2 Score 0 0 0   Fall Risk  11/18/2018 12/10/2017 09/03/2016  Falls in the past year? 0 No Yes  Number falls in past yr: 0 - 1  Injury with Fall? 0 - No  Risk for fall due to : - Impaired balance/gait -  Relevant past medical, surgical, family and social history reviewed Past Medical History:  Diagnosis Date  . Hypertension   . Thyroid disease    Past Surgical History:  Procedure Laterality Date  . ABDOMINAL HYSTERECTOMY  1980  . BUNIONECTOMY Left 2006  . ETHMOIDECTOMY Left 02/24/2018   Procedure: ETHMOIDECTOMY;  Surgeon: Clyde Canterbury, MD;  Location: [facility];  Service: ENT;  Laterality: Left;  . FRONTAL SINUS EXPLORATION Left 02/24/2018   Procedure: FRONTAL SINUS EXPLORATION;  Surgeon: Clyde Canterbury, MD;  Location: [place];  Service: ENT;  Laterality: Left;  . IMAGE GUIDED SINUS SURGERY N/A 02/24/2018   Procedure: IMAGE GUIDED SINUS SURGERY;  Surgeon: Clyde Canterbury, MD;  Location: [facility];  Service: ENT;  Laterality: N/A;  GAVE DISK BRENDA 3-21 KP  . MAXILLARY ANTROSTOMY Left  02/24/2018   Procedure: ENDSOCPIC MAXILLARY ANTROSTOMY TOTAL;  Surgeon: Clyde Canterbury, MD;  Location: [facility];  Service: ENT;  Laterality: Left;  . SPHENOIDECTOMY Left 02/24/2018   Procedure: SPHENOIDECTOMY;  Surgeon: Clyde Canterbury, MD;  Location: [facility];  Service: ENT;  Laterality: Left;  . TUBAL LIGATION  1969   Family History  Problem Relation Age of Onset  . Cancer Mother        colon  . Celiac disease Mother   . Lymphoma Father   . Hypertension Father    Social History   Tobacco Use  . Smoking status: Former Smoker    Last attempt to quit: 1995    Years since quitting: 25.1  . Smokeless tobacco: Never Used  Substance Use Topics  . Alcohol use: No  . Drug use: No     [facility] Visit from 11/18/2018 [facility]  AUDIT-C Score  0      Interim medical history since last visit reviewed. Allergies and medications reviewed  Review of Systems Per HPI unless specifically indicated above     Objective:    BP 136/78   Pulse 93   Temp 98.5 F (36.9 C) (Oral)   Resp 12   Ht 5\' 2"  (1.575 m)   Wt 136 lb 12.8 oz (62.1 kg)   SpO2 95%   BMI 25.02 kg/m   Wt Readings from Last 3 Encounters:  11/18/18 136 lb 12.8 oz (62.1 kg)  11/17/18 137 lb (62.1 kg)  08/17/18 138 lb (62.6 kg)    Physical Exam Constitutional:      General: She is not in acute distress. HENT:     Mouth/Throat:     Mouth: Mucous membranes are moist.  Cardiovascular:     Rate and Rhythm: Normal rate and regular rhythm.     Pulses:          Dorsalis pedis pulses are 1+ on the right side and 1+ on the left side.  Pulmonary:     Effort: Pulmonary effort is normal.     Breath sounds: Normal breath sounds.  Abdominal:     Palpations: Abdomen is soft.  Musculoskeletal:     Right shoulder: She exhibits decreased range of motion, tenderness and crepitus. She exhibits no bony tenderness, no swelling, no effusion and no deformity.     Lumbar back: She exhibits  decreased range of motion, tenderness, pain and spasm. She exhibits no swelling, no edema and no deformity.     Right foot: Decreased range of motion. Bunion present.     Left foot: Bunion present.       Feet:  Feet:  Right foot:     Skin integrity: No ulcer or erythema.     Left foot:     Skin integrity: No ulcer or erythema.     Comments: Right 2nd toe has limited mobility; hallux valgus of right great toe Neurological:     Mental Status: She is alert.     Deep Tendon Reflexes:     Reflex Scores:      Patellar reflexes are 1+ on the right side and 1+ on the left side.    Comments: Hip flexion 5-/5, leg extension 5/5, dorsiflexion 5/5 on the left; right side is 5/5   Psychiatric:        Mood and Affect: Mood is not anxious.        Assessment & Plan:   Problem List Items Addressed This Visit      Nervous and Auditory   Sciatica of left side    Refer to PT and ortho      Relevant Orders   Ambulatory referral to Orthopedic Surgery   Ambulatory referral to Physical Therapy     Musculoskeletal and Integument   Spondylolysis   Relevant Orders   Ambulatory referral to Orthopedic Surgery   Ambulatory referral to Physical Therapy   Spondylolisthesis at L4-L5 level - Primary    Refer to PT and ortho      Relevant Orders   Ambulatory referral to Orthopedic Surgery   Ambulatory referral to Physical Therapy   Hallux valgus of right foot     Other   Chronic right shoulder pain    Refer to ortho and PT      Relevant Medications   ibuprofen (ADVIL,MOTRIN) 200 MG tablet   Other Relevant Orders   Ambulatory referral to Orthopedic Surgery   Ambulatory referral to Physical Therapy    Other Visit Diagnoses    Encounter for hepatitis C screening test for low risk patient       Relevant Orders   Hepatitis C Antibody (Completed)   DDD (degenerative disc disease), lumbar       Relevant Medications   ibuprofen (ADVIL,MOTRIN) 200 MG tablet   Other Relevant Orders    Ambulatory referral to Orthopedic Surgery   Ambulatory referral to Physical Therapy   Bunion, right foot       Relevant Orders   Ambulatory referral to Podiatry   Bunionette of right foot       Relevant Orders   Ambulatory referral to Podiatry   1st MTP arthritis       Relevant Medications   ibuprofen (ADVIL,MOTRIN) 200 MG tablet   Other Relevant Orders   Ambulatory referral to Podiatry   Chronic nonintractable headache, unspecified headache type       Relevant Medications   ibuprofen (ADVIL,MOTRIN) 200 MG tablet   Other Relevant Orders   Ambulatory referral to Neurology   Neoplasm of uncertain behavior of skin of face       Relevant Orders   Ambulatory referral to Dermatology   Eye exam, routine       Relevant Orders   Ambulatory referral to Ophthalmology       Follow up plan: Return in about 4 weeks (around 12/16/2018) for follow-up visit with Dr. Sanda Klein; Medicare Wellness visit also with Kasy.  An after-visit summary was printed and given to the patient at [place].  Please see the patient instructions which may contain other information and recommendations beyond what is mentioned above in the assessment and plan.  No orders of the defined  types were placed in this encounter.   Orders Placed This Encounter  Procedures  . Hepatitis C Antibody  . Ambulatory referral to Ophthalmology  . Ambulatory referral to Orthopedic Surgery  . Ambulatory referral to Physical Therapy  . Ambulatory referral to Podiatry  . Ambulatory referral to Neurology  . Ambulatory referral to Dermatology

## 2018-11-18 NOTE — Assessment & Plan Note (Signed)
Refer to PT and ortho

## 2018-11-18 NOTE — Assessment & Plan Note (Signed)
Refer to ortho and PT

## 2018-11-19 ENCOUNTER — Other Ambulatory Visit: Payer: Self-pay

## 2018-11-19 DIAGNOSIS — Z0100 Encounter for examination of eyes and vision without abnormal findings: Secondary | ICD-10-CM

## 2018-11-19 LAB — HEPATITIS C ANTIBODY
Hepatitis C Ab: NONREACTIVE
SIGNAL TO CUT-OFF: 0.01 (ref <1.00)

## 2018-11-27 DIAGNOSIS — M25511 Pain in right shoulder: Secondary | ICD-10-CM | POA: Diagnosis not present

## 2018-11-27 DIAGNOSIS — G8929 Other chronic pain: Secondary | ICD-10-CM | POA: Diagnosis not present

## 2018-11-27 DIAGNOSIS — M19011 Primary osteoarthritis, right shoulder: Secondary | ICD-10-CM | POA: Diagnosis not present

## 2018-11-30 ENCOUNTER — Other Ambulatory Visit: Payer: Self-pay | Admitting: Sports Medicine

## 2018-11-30 DIAGNOSIS — G8929 Other chronic pain: Secondary | ICD-10-CM

## 2018-11-30 DIAGNOSIS — M25511 Pain in right shoulder: Principal | ICD-10-CM

## 2018-11-30 DIAGNOSIS — M19011 Primary osteoarthritis, right shoulder: Secondary | ICD-10-CM

## 2018-12-01 ENCOUNTER — Telehealth: Payer: Self-pay

## 2018-12-01 DIAGNOSIS — M4300 Spondylolysis, site unspecified: Secondary | ICD-10-CM

## 2018-12-01 DIAGNOSIS — M5432 Sciatica, left side: Secondary | ICD-10-CM

## 2018-12-01 DIAGNOSIS — M4316 Spondylolisthesis, lumbar region: Secondary | ICD-10-CM

## 2018-12-01 NOTE — Telephone Encounter (Signed)
Per patient, she was told she needs to see a neurosurgeon not an orthopedic and the ortho referral was removed. She also said she was told she could just call you and that you would be able to order it. Please advise.

## 2018-12-01 NOTE — Telephone Encounter (Signed)
Copied from [place] [id number]. Topic: Referral - Request for Referral >> Dec 01, 2018 12:00 PM Yvette Rack wrote: Has patient seen PCP for this complaint? yes  *If NO, is insurance requiring patient see PCP for this issue before PCP can refer them? Referral for which specialty: MRI on back Preferred provider/office: [place] area Reason for referral: Pt requests referral for MRI for her back

## 2018-12-01 NOTE — Telephone Encounter (Signed)
We referred her to orthopaedics for this in January We'll ask her to contact that doctor for an MRI order Just let her know that her insurance company will probably want her to have plain films and 6 weeks of PT before they'll approve an MRI (that's been my experience)

## 2018-12-02 NOTE — Telephone Encounter (Signed)
So I have ordered the neurosurgery referral I also ordered the lumbar spine MRI She should be hearing about these soon

## 2018-12-03 NOTE — Telephone Encounter (Signed)
Pt.notified

## 2018-12-07 ENCOUNTER — Ambulatory Visit
Admission: RE | Admit: 2018-12-07 | Discharge: 2018-12-07 | Disposition: A | Discharge status: Home / Self Care | Payer: Medicare Other | Source: Ambulatory Visit | Attending: Sports Medicine | Admitting: Sports Medicine

## 2018-12-07 DIAGNOSIS — M25511 Pain in right shoulder: Secondary | ICD-10-CM | POA: Diagnosis not present

## 2018-12-07 DIAGNOSIS — M19011 Primary osteoarthritis, right shoulder: Secondary | ICD-10-CM | POA: Diagnosis not present

## 2018-12-07 DIAGNOSIS — G8929 Other chronic pain: Secondary | ICD-10-CM | POA: Insufficient documentation

## 2018-12-08 DIAGNOSIS — M79672 Pain in left foot: Secondary | ICD-10-CM | POA: Diagnosis not present

## 2018-12-08 DIAGNOSIS — M2041 Other hammer toe(s) (acquired), right foot: Secondary | ICD-10-CM | POA: Diagnosis not present

## 2018-12-08 DIAGNOSIS — M2011 Hallux valgus (acquired), right foot: Secondary | ICD-10-CM | POA: Diagnosis not present

## 2018-12-08 DIAGNOSIS — M79671 Pain in right foot: Secondary | ICD-10-CM | POA: Diagnosis not present

## 2018-12-08 DIAGNOSIS — M2022 Hallux rigidus, left foot: Secondary | ICD-10-CM | POA: Diagnosis not present

## 2018-12-14 ENCOUNTER — Ambulatory Visit
Admission: RE | Admit: 2018-12-14 | Discharge: 2018-12-14 | Disposition: A | Discharge status: Home / Self Care | Payer: Medicare Other | Source: Ambulatory Visit | Attending: Family Medicine | Admitting: Family Medicine

## 2018-12-14 DIAGNOSIS — M545 Low back pain: Secondary | ICD-10-CM | POA: Diagnosis not present

## 2018-12-14 DIAGNOSIS — M4316 Spondylolisthesis, lumbar region: Secondary | ICD-10-CM | POA: Diagnosis not present

## 2018-12-14 DIAGNOSIS — M4300 Spondylolysis, site unspecified: Secondary | ICD-10-CM | POA: Diagnosis not present

## 2018-12-14 DIAGNOSIS — M5432 Sciatica, left side: Secondary | ICD-10-CM | POA: Insufficient documentation

## 2018-12-14 DIAGNOSIS — H2513 Age-related nuclear cataract, bilateral: Secondary | ICD-10-CM | POA: Diagnosis not present

## 2018-12-17 ENCOUNTER — Ambulatory Visit (INDEPENDENT_AMBULATORY_CARE_PROVIDER_SITE_OTHER): Payer: Medicare Other | Admitting: Family Medicine

## 2018-12-17 ENCOUNTER — Encounter: Payer: Self-pay | Admitting: Family Medicine

## 2018-12-17 VITALS — BP 130/72 | HR 96 | Temp 98.8°F | Ht 62.0 in | Wt 135.5 lb

## 2018-12-17 DIAGNOSIS — M1732 Unilateral post-traumatic osteoarthritis, left knee: Secondary | ICD-10-CM

## 2018-12-17 DIAGNOSIS — M5432 Sciatica, left side: Secondary | ICD-10-CM | POA: Diagnosis not present

## 2018-12-17 DIAGNOSIS — F4321 Adjustment disorder with depressed mood: Secondary | ICD-10-CM | POA: Diagnosis not present

## 2018-12-17 DIAGNOSIS — D582 Other hemoglobinopathies: Secondary | ICD-10-CM

## 2018-12-17 DIAGNOSIS — M4316 Spondylolisthesis, lumbar region: Secondary | ICD-10-CM

## 2018-12-17 DIAGNOSIS — M2011 Hallux valgus (acquired), right foot: Secondary | ICD-10-CM | POA: Diagnosis not present

## 2018-12-17 DIAGNOSIS — M19011 Primary osteoarthritis, right shoulder: Secondary | ICD-10-CM

## 2018-12-17 DIAGNOSIS — N952 Postmenopausal atrophic vaginitis: Secondary | ICD-10-CM | POA: Diagnosis not present

## 2018-12-17 DIAGNOSIS — I10 Essential (primary) hypertension: Secondary | ICD-10-CM

## 2018-12-17 DIAGNOSIS — L738 Other specified follicular disorders: Secondary | ICD-10-CM

## 2018-12-17 DIAGNOSIS — N8111 Cystocele, midline: Secondary | ICD-10-CM | POA: Diagnosis not present

## 2018-12-17 DIAGNOSIS — D1724 Benign lipomatous neoplasm of skin and subcutaneous tissue of left leg: Secondary | ICD-10-CM | POA: Diagnosis not present

## 2018-12-17 DIAGNOSIS — E039 Hypothyroidism, unspecified: Secondary | ICD-10-CM | POA: Diagnosis not present

## 2018-12-17 DIAGNOSIS — R918 Other nonspecific abnormal finding of lung field: Secondary | ICD-10-CM | POA: Insufficient documentation

## 2018-12-17 DIAGNOSIS — Z5181 Encounter for therapeutic drug level monitoring: Secondary | ICD-10-CM

## 2018-12-17 MED ORDER — VITAMIN D 50 MCG (2000 UT) PO CAPS: 1.0000 | ORAL_CAPSULE | Freq: Every day | ORAL | Status: DC

## 2018-12-17 MED ORDER — TURMERIC 500 MG PO CAPS
1.0000 | ORAL_CAPSULE | Freq: Two times a day (BID) | ORAL | Status: DC
Start: 2018-12-17 — End: 2022-05-20

## 2018-12-17 MED ORDER — LUTEIN 10 MG PO TABS: ORAL_TABLET | ORAL | Status: DC

## 2018-12-17 MED ORDER — BIOTIN 10000 MCG PO TABS: 1.0000 | ORAL_TABLET | Freq: Every day | ORAL | Status: DC

## 2018-12-17 NOTE — Assessment & Plan Note (Signed)
Improving, there at times

## 2018-12-17 NOTE — Assessment & Plan Note (Signed)
Going to continue turmeric; not interested in seeing an orthopaedist

## 2018-12-17 NOTE — Patient Instructions (Addendum)
Return in 1-2 weeks for labs (you do not have to fast if you don't want to)  Try to follow the DASH guidelines (DASH stands for Dietary Approaches to Stop Hypertension). Try to limit the sodium in your diet to no more than 1,500mg  of sodium per day. Certainly try to not exceed 2,000 mg per day at the very most. Do not add salt when cooking or at the table.  Check the sodium amount on labels when shopping, and choose items lower in sodium when given a choice. Avoid or limit foods that already contain a lot of sodium. Eat a diet rich in fruits and vegetables and whole grains, and try to lose weight if overweight or obese   DASH Eating Plan DASH stands for "Dietary Approaches to Stop Hypertension." The DASH eating plan is a healthy eating plan that has been shown to reduce high blood pressure (hypertension). It may also reduce your risk for type 2 diabetes, heart disease, and stroke. The DASH eating plan may also help with weight loss. What are tips for following this plan?  General guidelines  Avoid eating more than 2,300 mg (milligrams) of salt (sodium) a day. If you have hypertension, you may need to reduce your sodium intake to 1,500 mg a day.  Limit alcohol intake to no more than 1 drink a day for nonpregnant women and 2 drinks a day for men. One drink equals 12 oz of beer, 5 oz of wine, or 1 oz of hard liquor.  Work with your health care provider to maintain a healthy body weight or to lose weight. Ask what an ideal weight is for you.  Get at least 30 minutes of exercise that causes your heart to beat faster (aerobic exercise) most days of the week. Activities may include walking, swimming, or biking.  Work with your health care provider or diet and nutrition specialist (dietitian) to adjust your eating plan to your individual calorie needs. Reading food labels   Check food labels for the amount of sodium per serving. Choose foods with less than 5 percent of the Daily Value of sodium.  Generally, foods with less than 300 mg of sodium per serving fit into this eating plan.  To find whole grains, look for the word "whole" as the first word in the ingredient list. Shopping  Buy products labeled as "low-sodium" or "no salt added."  Buy fresh foods. Avoid canned foods and premade or frozen meals. Cooking  Avoid adding salt when cooking. Use salt-free seasonings or herbs instead of table salt or sea salt. Check with your health care provider or pharmacist before using salt substitutes.  Do not fry foods. Cook foods using healthy methods such as baking, boiling, grilling, and broiling instead.  Cook with heart-healthy oils, such as olive, canola, soybean, or sunflower oil. Meal planning  Eat a balanced diet that includes: ? 5 or more servings of fruits and vegetables each day. At each meal, try to fill half of your plate with fruits and vegetables. ? Up to 6-8 servings of whole grains each day. ? Less than 6 oz of lean meat, poultry, or fish each day. A 3-oz serving of meat is about the same size as a deck of cards. One egg equals 1 oz. ? 2 servings of low-fat dairy each day. ? A serving of nuts, seeds, or beans 5 times each week. ? Heart-healthy fats. Healthy fats called Omega-3 fatty acids are found in foods such as flaxseeds and coldwater fish, like sardines,  salmon, and mackerel.  Limit how much you eat of the following: ? Canned or prepackaged foods. ? Food that is high in trans fat, such as fried foods. ? Food that is high in saturated fat, such as fatty meat. ? Sweets, desserts, sugary drinks, and other foods with added sugar. ? Full-fat dairy products.  Do not salt foods before eating.  Try to eat at least 2 vegetarian meals each week.  Eat more home-cooked food and less restaurant, buffet, and fast food.  When eating at a restaurant, ask that your food be prepared with less salt or no salt, if possible. What foods are recommended? The items listed may not  be a complete list. Talk with your dietitian about what dietary choices are best for you. Grains Whole-grain or whole-wheat bread. Whole-grain or whole-wheat pasta. Brown rice. [place]. Bulgur. Whole-grain and low-sodium cereals. Pita bread. Low-fat, low-sodium crackers. Whole-wheat flour tortillas. Vegetables Fresh or frozen vegetables (raw, steamed, roasted, or grilled). Low-sodium or reduced-sodium tomato and vegetable juice. Low-sodium or reduced-sodium tomato sauce and tomato paste. Low-sodium or reduced-sodium canned vegetables. Fruits All fresh, dried, or frozen fruit. Canned fruit in natural juice (without added sugar). Meat and other protein foods Skinless chicken or [place]. Ground chicken or [place]. Pork with fat trimmed off. Fish and seafood. Egg whites. Dried beans, peas, or lentils. Unsalted nuts, nut butters, and seeds. Unsalted canned beans. Lean cuts of beef with fat trimmed off. Low-sodium, lean deli meat. Dairy Low-fat (1%) or fat-free (skim) milk. Fat-free, low-fat, or reduced-fat cheeses. Nonfat, low-sodium ricotta or cottage cheese. Low-fat or nonfat yogurt. Low-fat, low-sodium cheese. Fats and oils Soft margarine without trans fats. Vegetable oil. Low-fat, reduced-fat, or light mayonnaise and salad dressings (reduced-sodium). Canola, safflower, olive, soybean, and sunflower oils. Avocado. Seasoning and other foods Herbs. Spices. Seasoning mixes without salt. Unsalted popcorn and pretzels. Fat-free sweets. What foods are not recommended? The items listed may not be a complete list. Talk with your dietitian about what dietary choices are best for you. Grains Baked goods made with fat, such as croissants, muffins, or some breads. Dry pasta or rice meal packs. Vegetables Creamed or fried vegetables. Vegetables in a cheese sauce. Regular canned vegetables (not low-sodium or reduced-sodium). Regular canned tomato sauce and paste (not low-sodium or reduced-sodium). Regular  tomato and vegetable juice (not low-sodium or reduced-sodium). Angie Fava. Olives. Fruits Canned fruit in a light or heavy syrup. Fried fruit. Fruit in cream or butter sauce. Meat and other protein foods Fatty cuts of meat. Ribs. Fried meat. Berniece Salines. Sausage. Bologna and other processed lunch meats. Salami. Fatback. Hotdogs. Bratwurst. Salted nuts and seeds. Canned beans with added salt. Canned or smoked fish. Whole eggs or egg yolks. Chicken or [place] with skin. Dairy Whole or 2% milk, cream, and half-and-half. Whole or full-fat cream cheese. Whole-fat or sweetened yogurt. Full-fat cheese. Nondairy creamers. Whipped toppings. Processed cheese and cheese spreads. Fats and oils Butter. Stick margarine. Lard. Shortening. Ghee. Bacon fat. Tropical oils, such as coconut, palm kernel, or palm oil. Seasoning and other foods Salted popcorn and pretzels. Onion salt, garlic salt, seasoned salt, table salt, and sea salt. Worcestershire sauce. Tartar sauce. Barbecue sauce. Teriyaki sauce. Soy sauce, including reduced-sodium. Steak sauce. Canned and packaged gravies. Fish sauce. Oyster sauce. Cocktail sauce. Horseradish that you find on the shelf. Ketchup. Mustard. Meat flavorings and tenderizers. Bouillon cubes. Hot sauce and Tabasco sauce. Premade or packaged marinades. Premade or packaged taco seasonings. Relishes. Regular salad dressings. Where to find more information:  [employer]  Heart, Lung, and Blood Institute: https://wilson-eaton.com/  American Heart Association: www.heart.org Summary  The DASH eating plan is a healthy eating plan that has been shown to reduce high blood pressure (hypertension). It may also reduce your risk for type 2 diabetes, heart disease, and stroke.  With the DASH eating plan, you should limit salt (sodium) intake to 2,300 mg a day. If you have hypertension, you may need to reduce your sodium intake to 1,500 mg a day.  When on the DASH eating plan, aim to eat more fresh fruits and  vegetables, whole grains, lean proteins, low-fat dairy, and heart-healthy fats.  Work with your health care provider or diet and nutrition specialist (dietitian) to adjust your eating plan to your individual calorie needs. This information is not intended to replace advice given to you by your health care provider. Make sure you discuss any questions you have with your health care provider. Document Released: 09/26/2011 Document Revised: 09/30/2016 Document Reviewed: 09/30/2016 Elsevier Interactive Patient Education  2019 [employer].

## 2018-12-17 NOTE — Assessment & Plan Note (Signed)
Managed soon by neurosurgeon

## 2018-12-17 NOTE — Assessment & Plan Note (Signed)
She is not interested in going back to the orthopaedist

## 2018-12-17 NOTE — Assessment & Plan Note (Signed)
Dr. Kenton Kingfisher, GYN

## 2018-12-17 NOTE — Assessment & Plan Note (Signed)
Controlled today; affected by pain; she is limiting salt effectively; try to follow DASH guidelines

## 2018-12-17 NOTE — Assessment & Plan Note (Signed)
Check thyroid function; sounds euthyroid

## 2018-12-17 NOTE — Assessment & Plan Note (Signed)
New 4 mm nodule, stable/old 7 mm nodule; will get chest CT in Feb 2021; patient agrees; glad she quit smoking in 1999

## 2018-12-17 NOTE — Assessment & Plan Note (Signed)
Pessary managed by DR. Kenton Kingfisher

## 2018-12-17 NOTE — Assessment & Plan Note (Signed)
Patient has already seen podiastrist; not interested in surgery

## 2018-12-17 NOTE — Assessment & Plan Note (Signed)
Possibly a Baker's cyst, but not growing so will just watch

## 2018-12-17 NOTE — Progress Notes (Signed)
BP 130/72   Pulse 96   Temp 98.8 F (37.1 C)   Ht 5\' 2"  (1.575 m)   Wt 135 lb 8 oz (61.5 kg)   SpO2 98%   BMI 24.78 kg/m    Subjective:    Patient ID: Annette Stevens, female    DOB: [birth year], 73 y.o.   MRN: [id number]  HPI: Annette Stevens is a 73 y.o. female  Chief Complaint  Patient presents with  . Follow-up  . Medication Management    please delete all meds not taking pt wants list updated    HPI Here for follow-up  Reviewed medicine list and deleted ones not used, old  She is here for follow-up  HTN; blood pressure is fine; pain level has improved; never adding salt to food, she takes that back, just with a steak not very often  Hypothyroidism; weight is overall stable; normal bowels; energy level depends on her back and leg  Lab Results  Component Value Date   TSH 1.34 12/10/2017   Depression; getting better; getting under control  Recent lumbar MRI; going to see surgeon soon  Shoulder arthritis; she says a scope and they want to do a whole shoulder replacement; limited ROM  Hallux valgus of right foot; saw podiatrist and patient is not keen on having surgery done  OA of the knee; turmeric is helping; activity level affects this; post-traumatic, hit the knee on [place], driveway years ago  Cystocoele, managed by Dr. Kenton Kingfisher, gyn  Does not want to go to grief classes; keep opening up the wounds  Lipoma or cyst of the left lateral leg; gets bigger with activity  She wants to see dermatologist, places on her face, breaking out, spots on her face  She quit smoking 25 years ago; lung nodule noted on shoulder imaging, see below  IMPRESSION: 1. Glenohumeral osteoarthritis with large joint effusion and multiple large intra-articular bodies. Chronic appearing erosions of the humeral head could reflect underlying inflammatory arthropathy. Synovial osteochondromatosis is thought to be less likely given patient's age and differing sizes of the intra-articular  bodies. 2. Moderate acromioclavicular osteoarthritis. 3. 4 mm subpleural nodule in the right lower lobe. No follow-up needed if patient is low-risk. Non-contrast chest CT can be considered in 12 months if patient is high-risk. This recommendation follows the consensus statement: Guidelines for Management of Incidental Pulmonary Nodules Detected on CT Images: From the Fleischner Society 2017; Radiology 2017; 284:228-243.   Electronically Signed   By: Titus Dubin M.D.   On: 12/07/2018 16:34  Her nonfasting glucose was 131 in Feb 2019, no dry mouth or blurred; morbidly obese brother has diabetes  Mildly elevated H/H; no snoring at all  Depression screen [facility] 2/9 11/18/2018 12/10/2017 09/03/2016  Decreased Interest - 0 0  Down, Depressed, Hopeless 0 0 0  PHQ - 2 Score 0 0 0   Fall Risk  11/18/2018 12/10/2017 09/03/2016  Falls in the past year? 0 No Yes  Number falls in past yr: 0 - 1  Injury with Fall? 0 - No  Risk for fall due to : - Impaired balance/gait -    Relevant past medical, surgical, family and social history reviewed Past Medical History:  Diagnosis Date  . Hypertension   . Thyroid disease    Past Surgical History:  Procedure Laterality Date  . ABDOMINAL HYSTERECTOMY  1980  . BUNIONECTOMY Left 2006  . ETHMOIDECTOMY Left 02/24/2018   Procedure: ETHMOIDECTOMY;  Surgeon: Clyde Canterbury, MD;  Location: [place];  Service: ENT;  Laterality: Left;  . FRONTAL SINUS EXPLORATION Left 02/24/2018   Procedure: FRONTAL SINUS EXPLORATION;  Surgeon: Clyde Canterbury, MD;  Location: [place];  Service: ENT;  Laterality: Left;  . IMAGE GUIDED SINUS SURGERY N/A 02/24/2018   Procedure: IMAGE GUIDED SINUS SURGERY;  Surgeon: Clyde Canterbury, MD;  Location: [place];  Service: ENT;  Laterality: N/A;  GAVE DISK BRENDA 3-21 KP  . MAXILLARY ANTROSTOMY Left 02/24/2018   Procedure: ENDSOCPIC MAXILLARY ANTROSTOMY TOTAL;  Surgeon: Clyde Canterbury, MD;  Location: [place];  Service: ENT;  Laterality: Left;  . SPHENOIDECTOMY Left 02/24/2018   Procedure: SPHENOIDECTOMY;  Surgeon: Clyde Canterbury, MD;  Location: [place];  Service: ENT;  Laterality: Left;  . TUBAL LIGATION  1969   Family History  Problem Relation Age of Onset  . Cancer Mother        colon  . Celiac disease Mother   . Lymphoma Father   . Hypertension Father    Social History   Tobacco Use  . Smoking status: Former Smoker    Last attempt to quit: 1995    Years since quitting: 25.1  . Smokeless tobacco: Never Used  Substance Use Topics  . Alcohol use: No  . Drug use: No     Office Visit from 11/18/2018 in [facility]  AUDIT-C Score  0      Interim medical history since last visit reviewed. Allergies and medications reviewed  Review of Systems Per HPI unless specifically indicated above     Objective:    BP 130/72   Pulse 96   Temp 98.8 F (37.1 C)   Ht 5\' 2"  (1.575 m)   Wt 135 lb 8 oz (61.5 kg)   SpO2 98%   BMI 24.78 kg/m   Wt Readings from Last 3 Encounters:  12/17/18 135 lb 8 oz (61.5 kg)  11/18/18 136 lb 12.8 oz (62.1 kg)  11/17/18 137 lb (62.1 kg)    Physical Exam Constitutional:      General: She is not in acute distress.    Appearance: She is well-developed. She is not diaphoretic.  HENT:     Head: Normocephalic and atraumatic.  Eyes:     General: No scleral icterus. Neck:     Thyroid: No thyromegaly.  Cardiovascular:     Rate and Rhythm: Normal rate and regular rhythm.     Heart sounds: Normal heart sounds. No murmur.  Pulmonary:     Effort: Pulmonary effort is normal. No respiratory distress.     Breath sounds: Normal breath sounds. No wheezing.  Abdominal:     General: Bowel sounds are normal. There is no distension.     Palpations: Abdomen is soft.  Musculoskeletal:       Legs:     Comments: Soft fatty/cystic lesion on the LEFT lateral leg; no hard/firm components  Skin:    General: Skin is warm and  dry.     Coloration: Skin is not pale.  Neurological:     Mental Status: She is alert.  Psychiatric:        Behavior: Behavior normal.        Thought Content: Thought content normal.        Judgment: Judgment normal.     Results for orders placed or performed in visit on 11/18/18  Hepatitis C Antibody  Result Value Ref Range   Hepatitis C Ab NON-REACTIVE NON-REACTI   SIGNAL TO CUT-OFF 0.01 <1.00  Assessment & Plan:   Problem List Items Addressed This Visit      Cardiovascular and Mediastinum   Essential hypertension, benign - Primary (Chronic)    Controlled today; affected by pain; she is limiting salt effectively; try to follow DASH guidelines      Relevant Orders   Lipid panel     Endocrine   Hypothyroidism (Chronic)    Check thyroid function; sounds euthyroid      Relevant Orders   TSH     Nervous and Auditory   Sciatica of left side    Managed soon by neurosurgeon        Musculoskeletal and Integument   Spondylolisthesis at L4-L5 level    Going to see neurosurgeon; they told her to hold off on PT until after MRI and saw neurosurgeon      Osteoarthritis of knee    Going to continue turmeric; not interested in seeing an orthopaedist      Hallux valgus of right foot    Patient has already seen podiastrist; not interested in surgery      Degenerative joint disease of shoulder region    She is not interested in going back to the orthopaedist        Genitourinary   Vaginal atrophy    Dr. Kenton Kingfisher, GYN      Cystocele, midline    Pessary managed by DR. Harris        Other   Lung nodules    New 4 mm nodule, stable/old 7 mm nodule; will get chest CT in Feb 2021; patient agrees; glad she quit smoking in 1999      Lipoma of left lower extremity    Possibly a Baker's cyst, but not growing so will just watch      Relevant Orders   Ambulatory referral to Dermatology   Grieving    Improving, there at times       Other Visit Diagnoses     Sebaceous gland hyperplasia of face       refer to dermatologist   Relevant Orders   Ambulatory referral to Dermatology   Elevated hemoglobin ([place])       Relevant Orders   CBC   Medication monitoring encounter       Relevant Orders   COMPLETE METABOLIC PANEL WITH GFR       Follow up plan: No follow-ups on file.  An after-visit summary was printed and given to the patient at [place].  Please see the patient instructions which may contain other information and recommendations beyond what is mentioned above in the assessment and plan.  Meds ordered this encounter  Medications  . Turmeric 500 MG CAPS    Sig: Take 1 tablet by mouth 2 (two) times daily.  . Cholecalciferol (VITAMIN D) 50 MCG (2000 UT) CAPS    Sig: Take 1 capsule (2,000 Units total) by mouth daily.  . Biotin 10000 MCG TABS    Sig: Take 1 tablet by mouth daily. JUST on four days of the week    Dispense:  30 tablet  . Lutein 10 MG TABS    Sig: HerbaVision Lutein with bilberry, one by mouth daily    Orders Placed This Encounter  Procedures  . CBC  . COMPLETE METABOLIC PANEL WITH GFR  . Lipid panel  . TSH  . Ambulatory referral to Dermatology

## 2018-12-17 NOTE — Assessment & Plan Note (Signed)
Going to see neurosurgeon; they told her to hold off on PT until after MRI and saw neurosurgeon

## 2018-12-21 ENCOUNTER — Encounter: Payer: Self-pay | Admitting: Family Medicine

## 2018-12-21 DIAGNOSIS — E039 Hypothyroidism, unspecified: Secondary | ICD-10-CM

## 2018-12-22 ENCOUNTER — Encounter: Payer: Self-pay | Admitting: Family Medicine

## 2018-12-31 ENCOUNTER — Other Ambulatory Visit: Payer: Self-pay

## 2018-12-31 ENCOUNTER — Ambulatory Visit (INDEPENDENT_AMBULATORY_CARE_PROVIDER_SITE_OTHER): Payer: Medicare Other

## 2018-12-31 VITALS — BP 144/82 | HR 97 | Temp 98.5°F | Resp 16 | Ht 62.0 in | Wt 136.6 lb

## 2018-12-31 DIAGNOSIS — Z5181 Encounter for therapeutic drug level monitoring: Secondary | ICD-10-CM

## 2018-12-31 DIAGNOSIS — Z0000 Encounter for general adult medical examination without abnormal findings: Secondary | ICD-10-CM

## 2018-12-31 DIAGNOSIS — D582 Other hemoglobinopathies: Secondary | ICD-10-CM

## 2018-12-31 DIAGNOSIS — E039 Hypothyroidism, unspecified: Secondary | ICD-10-CM | POA: Diagnosis not present

## 2018-12-31 DIAGNOSIS — I10 Essential (primary) hypertension: Secondary | ICD-10-CM | POA: Diagnosis not present

## 2018-12-31 NOTE — Progress Notes (Addendum)
Subjective:   Annette Stevens is a 73 y.o. female who presents for an Initial Medicare Annual Wellness Visit.  Review of Systems      Cardiac Risk Factors include: advanced age (>84men, >64 women)     Objective:    Today's Vitals   12/31/18 1056 12/31/18 1057  BP: (!) 144/82   Pulse: 97   Resp: 16   Temp: 98.5 F (36.9 C)   TempSrc: Oral   SpO2: 97%   Weight: 136 lb 9.6 oz (62 kg)   Height: 5\' 2"  (1.575 m)   PainSc:  4    Body mass index is 24.98 kg/m.  Advanced Directives 12/31/2018 02/24/2018 05/02/2017 03/05/2017 09/03/2016  Does Patient Have a Medical Advance Directive? No No No No No  Would patient like information on creating a medical advance directive? Yes (MAU/Ambulatory/Procedural Areas - Information given) Yes (MAU/Ambulatory/Procedural Areas - Information given) - - No - patient declined information    Current Medications (verified) Outpatient Encounter Medications as of 12/31/2018  Medication Sig  . Biotin 10000 MCG TABS Take 1 tablet by mouth daily. JUST on four days of the week  . Cholecalciferol (VITAMIN D) 50 MCG (2000 UT) CAPS Take 1 capsule (2,000 Units total) by mouth daily.  Marland Kitchen ibuprofen (ADVIL,MOTRIN) 200 MG tablet Take 200 mg by mouth every 6 (six) hours as needed.  Marland Kitchen levothyroxine (SYNTHROID) 200 MCG tablet Take 0.5 tablets (100 mcg total) by mouth daily before breakfast. Brand name medically necessary; dispense "SYNTHROID" as written  . Lutein 10 MG TABS HerbaVision Lutein with bilberry, one by mouth daily  . Multiple Vitamin (MULTIVITAMIN) capsule Take 1 capsule by mouth daily.  . Potassium 99 MG TABS Take 1 tablet by mouth daily.  . Turmeric 500 MG CAPS Take 1 tablet by mouth 2 (two) times daily.  Marland Kitchen zinc gluconate 50 MG tablet Take 50 mg by mouth daily.   No facility-administered encounter medications on file as of 12/31/2018.     Allergies (verified) Other   History: Past Medical History:  Diagnosis Date  . Hypertension   . Thyroid disease     Past Surgical History:  Procedure Laterality Date  . ABDOMINAL HYSTERECTOMY  1980   partial still has ovaries  . BUNIONECTOMY Left 2006  . ETHMOIDECTOMY Left 02/24/2018   Procedure: ETHMOIDECTOMY;  Surgeon: Clyde Canterbury, MD;  Location: [place];  Service: ENT;  Laterality: Left;  . FRONTAL SINUS EXPLORATION Left 02/24/2018   Procedure: FRONTAL SINUS EXPLORATION;  Surgeon: Clyde Canterbury, MD;  Location: [place];  Service: ENT;  Laterality: Left;  . IMAGE GUIDED SINUS SURGERY N/A 02/24/2018   Procedure: IMAGE GUIDED SINUS SURGERY;  Surgeon: Clyde Canterbury, MD;  Location: [place];  Service: ENT;  Laterality: N/A;  GAVE DISK BRENDA 3-21 KP  . MAXILLARY ANTROSTOMY Left 02/24/2018   Procedure: ENDSOCPIC MAXILLARY ANTROSTOMY TOTAL;  Surgeon: Clyde Canterbury, MD;  Location: [place];  Service: ENT;  Laterality: Left;  . SPHENOIDECTOMY Left 02/24/2018   Procedure: SPHENOIDECTOMY;  Surgeon: Clyde Canterbury, MD;  Location: [facility];  Service: ENT;  Laterality: Left;  . TUBAL LIGATION  1969   Family History  Problem Relation Age of Onset  . Cancer Mother        colon  . Celiac disease Mother   . Lymphoma Father   . Hypertension Father    Social History   Socioeconomic History  . Marital status: Widowed    Spouse name: Not on file  .  Number of children: 2  . Years of education: Not on file  . Highest education level: High school graduate  Occupational History  . Not on file  Social Needs  . Financial resource strain: Not hard at all  . Food insecurity:    Worry: Never true    Inability: Never true  . Transportation needs:    Medical: No    Non-medical: No  Tobacco Use  . Smoking status: Former Smoker    Last attempt to quit: 1995    Years since quitting: 25.2  . Smokeless tobacco: Never Used  Substance and Sexual Activity  . Alcohol use: No  . Drug use: No  . Sexual activity: Not on file  Lifestyle  . Physical activity:    Days per  week: 0 days    Minutes per session: 0 min  . Stress: Only a little  Relationships  . Social connections:    Talks on phone: Patient refused    Gets together: Patient refused    Attends religious service: Patient refused    Active member of club or organization: Patient refused    Attends meetings of clubs or organizations: Patient refused    Relationship status: Patient refused  Other Topics Concern  . Not on file  Social History Narrative  . Not on file    Tobacco Counseling Counseling given: Not Answered   Clinical Intake:  Pre-visit preparation completed: Yes  Pain : 0-10 Pain Score: 4  Pain Type: Chronic pain Pain Location: Back(left leg) Pain Orientation: Left, Medial Pain Descriptors / Indicators: Aching Pain Onset: More than a month ago Pain Frequency: Constant     BMI - recorded: 24.98 Nutritional Status: BMI of 19-24  Normal Nutritional Risks: None, Nausea/ vomitting/ diarrhea(diarrhea) Diabetes: No  How often do you need to have someone help you when you read instructions, pamphlets, or other written materials from your doctor or pharmacy?: 1 - Never  Interpreter Needed?: No  Information entered by :: Clemetine Marker LPN   Activities of Daily Living In your present state of health, do you have any difficulty performing the following activities: 12/31/2018 11/18/2018  Hearing? N N  Comment declines hearing aids -  Vision? N N  Comment glasses when needs -  Difficulty concentrating or making decisions? N N  Walking or climbing stairs? N N  Dressing or bathing? N N  Doing errands, shopping? N N  Preparing Food and eating ? N -  Using the Toilet? N -  In the past six months, have you accidently leaked urine? Y -  Comment wears pessary -  Do you have problems with loss of bowel control? N -  Managing your Medications? N -  Managing your Finances? N -  Housekeeping or managing your Housekeeping? N -  Some recent data might be hidden      Immunizations and Health Maintenance Immunization History  Administered Date(s) Administered  . Influenza, High Dose Seasonal PF 08/31/2018  . Influenza-Unspecified 09/25/2011, 11/05/2012, 10/26/2013, 07/30/2015, 07/09/2016, 08/08/2017  . Pneumococcal Polysaccharide-23 09/06/2010   There are no preventive care reminders to display for this patient.  Patient Care Team: Lada, Satira Anis, MD as PCP - General (Family Medicine) Clyde Canterbury, MD as Referring Physician (Otolaryngology)  Indicate any recent Medical Services you may have received from other than [facility] providers in the past year (date may be approximate).     Assessment:   This is a routine wellness examination for [place].  Hearing/Vision screen Hearing Screening Comments: Pt  c/o tinnitus Vision Screening Comments: Annual vision screenings done at [facility] issues and exercise activities discussed: Current Exercise Habits: The patient does not participate in regular exercise at present, Exercise limited by: orthopedic condition(s)  Goals    . Patient Stated     Patient states she would like to feel better physically to be able to do more physical activity       Depression Screen PHQ 2/9 Scores 12/31/2018 11/18/2018 12/10/2017 09/03/2016  PHQ - 2 Score 0 0 0 0  Exception Documentation - Patient refusal - -    Fall Risk Fall Risk  12/31/2018 11/18/2018 12/10/2017 09/03/2016  Falls in the past year? 0 0 No Yes  Number falls in past yr: 0 0 - 1  Injury with Fall? 0 0 - No  Risk for fall due to : - - Impaired balance/gait -  Follow up Falls prevention discussed - - -   FALL RISK PREVENTION PERTAINING TO THE HOME:  Any stairs in or around the home? Yes  If so, do they handrails? No  need handrails outside  Home free of loose throw rugs in walkways, pet beds, electrical cords, etc? Yes  Adequate lighting in your home to reduce risk of falls? Yes   ASSISTIVE DEVICES UTILIZED TO PREVENT FALLS:  Life  alert? No  Use of a cane, walker or w/c? No  Grab bars in the bathroom? No  Shower chair or bench in shower? No  Elevated toilet seat or a handicapped toilet? No   DME ORDERS:  DME order needed?  No   TIMED UP AND GO:  Was the test performed? Yes .  Length of time to ambulate 10 feet: 7 sec.   GAIT:  Appearance of gait:  Gait slow, steady and without the use of an assistive device.   Education: Fall risk prevention has been discussed.  Intervention(s) required? No   Cognitive Function: Pt declined 6CIT        Screening Tests Health Maintenance  Topic Date Due  . TETANUS/TDAP  11/19/2019 (Originally 05/29/2018)  . PNA vac Low Risk Adult (2 of 2 - PCV13) 11/19/2019 (Originally 09/07/2011)  . INFLUENZA VACCINE  Completed  . DEXA SCAN  Completed  . Hepatitis C Screening  Completed  . MAMMOGRAM  Discontinued  . COLONOSCOPY  Discontinued    Qualifies for Shingles Vaccine? Yes  . Due for Shingrix. Education has been provided regarding the importance of this vaccine. Pt has been advised to call insurance company to determine out of pocket expense. Advised may also receive vaccine at local pharmacy or Health Dept. Verbalized acceptance and understanding. Pt declines.  Tdap: Although this vaccine is not a covered service during a Wellness Exam, does the patient still wish to receive this vaccine today?  No .  Education has been provided regarding the importance of this vaccine. Advised may receive this vaccine at local pharmacy or Health Dept. Aware to provide a copy of the vaccination record if obtained from local pharmacy or Health Dept. Verbalized acceptance and understanding.  Flu Vaccine: Up to date  Pneumococcal Vaccine: Due for Pneumococcal vaccine. Does the patient want to receive this vaccine today?  No . Education has been provided regarding the importance of this vaccine but still declined. Advised may receive this vaccine at local pharmacy or Health Dept. Aware to  provide a copy of the vaccination record if obtained from local pharmacy or Health Dept. Verbalized acceptance and understanding.  Cancer Screenings:  Colorectal  Screening: pt declines  Mammogram: declines  Bone Density: declines  Lung Cancer Screening: (Low Dose CT Chest recommended if Age 76-80 years, 30 pack-year currently smoking OR have quit w/in 15years.) does not qualify.   Additional Screening:  Hepatitis C Screening: does qualify; Completed 11/18/18  Vision Screening: Recommended annual ophthalmology exams for early detection of glaucoma and other disorders of the eye. Is the patient up to date with their annual eye exam?  Yes  Who is the provider or what is the name of the office in which the pt attends annual eye exams? Dr. Ellin Mayhew  Dental Screening: Recommended annual dental exams for proper oral hygiene  Community Resource Referral:  CRR required this visit?  No      Plan:    I have personally reviewed and addressed the Medicare Annual Wellness questionnaire and have noted the following in the patient's chart:  A. Medical and social history B. Use of alcohol, tobacco or illicit drugs  C. Current medications and supplements D. Functional ability and status E.  Nutritional status F.  Physical activity G. Advance directives H. List of other physicians I.  Hospitalizations, surgeries, and ER visits in previous 12 months J.  [place] such as hearing and vision if needed, cognitive and depression L. Referrals and appointments   In addition, I have reviewed and discussed with patient certain preventive protocols, quality metrics, and best practice recommendations. A written personalized care plan for preventive services as well as general preventive health recommendations were provided to patient.   Signed,  Clemetine Marker, LPN Nurse Health Advisor   Nurse Notes: pt c/o back and leg pain. She does not want any screenings or vaccines other than flu.

## 2018-12-31 NOTE — Patient Instructions (Addendum)
[name] , Thank you for taking time to come for your Medicare Wellness Visit. I appreciate your ongoing commitment to your health goals. Please review the following plan we discussed and let me know if I can assist you in the future.   Screening recommendations/referrals: Colonoscopy: postponed Mammogram: postponed Bone Density: postponed Recommended yearly ophthalmology/optometry visit for glaucoma screening and checkup Recommended yearly dental visit for hygiene and checkup  Vaccinations: Influenza vaccine: done 08/31/18 Pneumococcal vaccine: done 09/06/10 Tdap vaccine: due - please contact us if you get a cut or scrape Shingles vaccine: Shingrix discussed. Please contact your pharmacy for coverage information.  Advanced directives: Advance directive discussed with you today. I have provided a copy for you to complete at home and have notarized. Once this is complete please bring a copy in to our office so we can scan it into your chart.  Conditions/risks identified: Recommend increasing physical activity as tolerated.   Next appointment: Please follow up in one year for your Medicare Annual Wellness visit.    Preventive Care 50 Years and Older, Female Preventive care refers to lifestyle choices and visits with your health care provider that can promote health and wellness. What does preventive care include?  A yearly physical exam. This is also called an annual well check.  Dental exams once or twice a year.  Routine eye exams. Ask your health care provider how often you should have your eyes checked.  Personal lifestyle choices, including:  Daily care of your teeth and gums.  Regular physical activity.  Eating a healthy diet.  Avoiding tobacco and drug use.  Limiting alcohol use.  Practicing safe sex.  Taking low-dose aspirin every day.  Taking vitamin and mineral supplements as recommended by your health care provider. What happens during an annual well check?  The services and screenings done by your health care provider during your annual well check will depend on your age, overall health, lifestyle risk factors, and family history of disease. Counseling  Your health care provider may ask you questions about your:  Alcohol use.  Tobacco use.  Drug use.  Emotional well-being.  Home and relationship well-being.  Sexual activity.  Eating habits.  History of falls.  Memory and ability to understand (cognition).  Work and work Statistician.  Reproductive health. Screening  You may have the following tests or measurements:  Height, weight, and BMI.  Blood pressure.  Lipid and cholesterol levels. These may be checked every 5 years, or more frequently if you are over 31 years old.  Skin check.  Lung cancer screening. You may have this screening every year starting at age 37 if you have a 30-pack-year history of smoking and currently smoke or have quit within the past 15 years.  Fecal occult blood test (FOBT) of the stool. You may have this test every year starting at age 49.  Flexible sigmoidoscopy or colonoscopy. You may have a sigmoidoscopy every 5 years or a colonoscopy every 10 years starting at age 80.  Hepatitis C blood test.  Hepatitis B blood test.  Sexually transmitted disease (STD) testing.  Diabetes screening. This is done by checking your blood sugar (glucose) after you have not eaten for a while (fasting). You may have this done every 1-3 years.  Bone density scan. This is done to screen for osteoporosis. You may have this done starting at age 26.  Mammogram. This may be done every 1-2 years. Talk to your health care provider about how often you should have regular  mammograms. Talk with your health care provider about your test results, treatment options, and if necessary, the need for more tests. Vaccines  Your health care provider may recommend certain vaccines, such as:  Influenza vaccine. This is  recommended every year.  Tetanus, diphtheria, and acellular pertussis (Tdap, Td) vaccine. You may need a Td booster every 10 years.  Zoster vaccine. You may need this after age 62.  Pneumococcal 13-valent conjugate (PCV13) vaccine. One dose is recommended after age 65.  Pneumococcal polysaccharide (PPSV23) vaccine. One dose is recommended after age 82. Talk to your health care provider about which screenings and vaccines you need and how often you need them. This information is not intended to replace advice given to you by your health care provider. Make sure you discuss any questions you have with your health care provider. Document Released: 11/03/2015 Document Revised: 06/26/2016 Document Reviewed: 08/08/2015 Elsevier Interactive Patient Education  2017 [place] Prevention in the Home Falls can cause injuries. They can happen to people of all ages. There are many things you can do to make your home safe and to help prevent falls. What can I do on the outside of my home?  Regularly fix the edges of walkways and driveways and fix any cracks.  Remove anything that might make you trip as you walk through a door, such as a raised step or threshold.  Trim any bushes or trees on the path to your home.  Use bright outdoor lighting.  Clear any walking paths of anything that might make someone trip, such as rocks or tools.  Regularly check to see if handrails are loose or broken. Make sure that both sides of any steps have handrails.  Any raised decks and porches should have guardrails on the edges.  Have any leaves, snow, or ice cleared regularly.  Use sand or salt on walking paths during winter.  Clean up any spills in your garage right away. This includes oil or grease spills. What can I do in the bathroom?  Use night lights.  Install grab bars by the toilet and in the tub and shower. Do not use towel bars as grab bars.  Use non-skid mats or decals in the tub or  shower.  If you need to sit down in the shower, use a plastic, non-slip stool.  Keep the floor dry. Clean up any water that spills on the floor as soon as it happens.  Remove soap buildup in the tub or shower regularly.  Attach bath mats securely with double-sided non-slip rug tape.  Do not have throw rugs and other things on the floor that can make you trip. What can I do in the bedroom?  Use night lights.  Make sure that you have a light by your bed that is easy to reach.  Do not use any sheets or blankets that are too big for your bed. They should not hang down onto the floor.  Have a firm chair that has side arms. You can use this for support while you get dressed.  Do not have throw rugs and other things on the floor that can make you trip. What can I do in the kitchen?  Clean up any spills right away.  Avoid walking on wet floors.  Keep items that you use a lot in easy-to-reach places.  If you need to reach something above you, use a strong step stool that has a grab bar.  Keep electrical cords out of the way.  Do not use floor polish or wax that makes floors slippery. If you must use wax, use non-skid floor wax.  Do not have throw rugs and other things on the floor that can make you trip. What can I do with my stairs?  Do not leave any items on the stairs.  Make sure that there are handrails on both sides of the stairs and use them. Fix handrails that are broken or loose. Make sure that handrails are as long as the stairways.  Check any carpeting to make sure that it is firmly attached to the stairs. Fix any carpet that is loose or worn.  Avoid having throw rugs at the top or bottom of the stairs. If you do have throw rugs, attach them to the floor with carpet tape.  Make sure that you have a light switch at the top of the stairs and the bottom of the stairs. If you do not have them, ask someone to add them for you. What else can I do to help prevent falls?   Wear shoes that:  Do not have high heels.  Have rubber bottoms.  Are comfortable and fit you well.  Are closed at the toe. Do not wear sandals.  If you use a stepladder:  Make sure that it is fully opened. Do not climb a closed stepladder.  Make sure that both sides of the stepladder are locked into place.  Ask someone to hold it for you, if possible.  Clearly mark and make sure that you can see:  Any grab bars or handrails.  First and last steps.  Where the edge of each step is.  Use tools that help you move around (mobility aids) if they are needed. These include:  Canes.  Walkers.  Scooters.  Crutches.  Turn on the lights when you go into a dark area. Replace any light bulbs as soon as they burn out.  Set up your furniture so you have a clear path. Avoid moving your furniture around.  If any of your floors are uneven, fix them.  If there are any pets around you, be aware of where they are.  Review your medicines with your doctor. Some medicines can make you feel dizzy. This can increase your chance of falling. Ask your doctor what other things that you can do to help prevent falls. This information is not intended to replace advice given to you by your health care provider. Make sure you discuss any questions you have with your health care provider. Document Released: 08/03/2009 Document Revised: 03/14/2016 Document Reviewed: 11/11/2014 Elsevier Interactive Patient Education  2017 [employer].

## 2019-01-01 ENCOUNTER — Other Ambulatory Visit: Payer: Self-pay | Admitting: Family Medicine

## 2019-01-01 DIAGNOSIS — E039 Hypothyroidism, unspecified: Secondary | ICD-10-CM

## 2019-01-01 LAB — COMPLETE METABOLIC PANEL WITH GFR
AG Ratio: 1.5 (calc) (ref 1.0–2.5)
ALT: 10 U/L (ref 6–29)
AST: 15 U/L (ref 10–35)
Albumin: 4.2 g/dL (ref 3.6–5.1)
Alkaline phosphatase (APISO): 79 U/L (ref 37–153)
BUN: 14 mg/dL (ref 7–25)
CO2: 29 mmol/L (ref 20–32)
Calcium: 9.9 mg/dL (ref 8.6–10.4)
Chloride: 103 mmol/L (ref 98–110)
Creat: 0.81 mg/dL (ref 0.60–0.93)
GFR, Est African American: 84 mL/min/{1.73_m2} (ref >=60)
GFR, Est Non African American: 72 mL/min/{1.73_m2} (ref >=60)
GLUCOSE: 103 mg/dL — AB (ref 65–99)
Globulin: 2.8 g/dL (calc) (ref 1.9–3.7)
Potassium: 3.8 mmol/L (ref 3.5–5.3)
Sodium: 142 mmol/L (ref 135–146)
Total Bilirubin: 0.3 mg/dL (ref 0.2–1.2)
Total Protein: 7 g/dL (ref 6.1–8.1)

## 2019-01-01 LAB — CBC
HEMATOCRIT: 42.8 % (ref 35.0–45.0)
Hemoglobin: 14.6 g/dL (ref 11.7–15.5)
MCH: 28.7 pg (ref 27.0–33.0)
MCHC: 34.1 g/dL (ref 32.0–36.0)
MCV: 84.3 fL (ref 80.0–100.0)
MPV: 11 fL (ref 7.5–12.5)
Platelets: 323 10*3/uL (ref 140–400)
RBC: 5.08 10*6/uL (ref 3.80–5.10)
RDW: 12.8 % (ref 11.0–15.0)
WBC: 7.7 10*3/uL (ref 3.8–10.8)

## 2019-01-01 LAB — TSH: TSH: 1.06 mIU/L (ref 0.40–4.50)

## 2019-01-01 LAB — LIPID PANEL
Cholesterol: 214 mg/dL — ABNORMAL HIGH (ref <200)
HDL: 86 mg/dL (ref >=50)
LDL Cholesterol (Calc): 107 mg/dL (calc) — ABNORMAL HIGH
NON-HDL CHOLESTEROL (CALC): 128 mg/dL (ref <130)
Total CHOL/HDL Ratio: 2.5 (calc) (ref <5.0)
Triglycerides: 112 mg/dL (ref <150)

## 2019-01-01 LAB — T4, FREE: Free T4: 1.6 ng/dL (ref 0.8–1.8)

## 2019-01-01 LAB — T3, FREE: T3, Free: 2.7 pg/mL (ref 2.3–4.2)

## 2019-01-04 ENCOUNTER — Encounter: Payer: Self-pay | Admitting: Family Medicine

## 2019-01-08 ENCOUNTER — Encounter: Payer: Self-pay | Admitting: Family Medicine

## 2019-01-14 DIAGNOSIS — L738 Other specified follicular disorders: Secondary | ICD-10-CM | POA: Diagnosis not present

## 2019-01-14 DIAGNOSIS — D225 Melanocytic nevi of trunk: Secondary | ICD-10-CM | POA: Diagnosis not present

## 2019-01-14 DIAGNOSIS — D1720 Benign lipomatous neoplasm of skin and subcutaneous tissue of unspecified limb: Secondary | ICD-10-CM | POA: Diagnosis not present

## 2019-01-14 DIAGNOSIS — L720 Epidermal cyst: Secondary | ICD-10-CM | POA: Diagnosis not present

## 2019-02-10 ENCOUNTER — Other Ambulatory Visit: Payer: Self-pay | Admitting: Family Medicine

## 2019-02-10 DIAGNOSIS — E039 Hypothyroidism, unspecified: Secondary | ICD-10-CM

## 2019-02-10 MED ORDER — LEVOTHYROXINE SODIUM 200 MCG PO TABS: 100.0000 ug | ORAL_TABLET | Freq: Every day | ORAL | 1 refills | Status: DC

## 2019-02-10 NOTE — Telephone Encounter (Signed)
Thank you. Rx sent.

## 2019-02-10 NOTE — Telephone Encounter (Signed)
Which pharmacy?

## 2019-02-12 ENCOUNTER — Telehealth: Payer: Self-pay | Admitting: Family Medicine

## 2019-02-12 NOTE — Telephone Encounter (Signed)
Note from pharmacy Is it okay to switch to Euthrox brand It's up to the patient I know she is particular about her thyroid medicine so please call her and ask her opinion; she may want to talk to her pharmacy to see if she can get the Synthroid or if she has to get it elsewhere If she does decide to switch, then we'll need to recheck her TSH 6-8 weeks after she starts the new brand; enter orders for TSH if she chooses to switch Thank you

## 2019-02-16 ENCOUNTER — Ambulatory Visit (INDEPENDENT_AMBULATORY_CARE_PROVIDER_SITE_OTHER): Payer: Medicare Other | Admitting: Obstetrics & Gynecology

## 2019-02-16 ENCOUNTER — Other Ambulatory Visit: Payer: Self-pay

## 2019-02-16 ENCOUNTER — Encounter: Payer: Self-pay | Admitting: Obstetrics & Gynecology

## 2019-02-16 VITALS — BP 150/98 | Ht 62.0 in | Wt 136.0 lb

## 2019-02-16 DIAGNOSIS — N8111 Cystocele, midline: Secondary | ICD-10-CM

## 2019-02-16 DIAGNOSIS — N952 Postmenopausal atrophic vaginitis: Secondary | ICD-10-CM

## 2019-02-16 DIAGNOSIS — N993 Prolapse of vaginal vault after hysterectomy: Secondary | ICD-10-CM

## 2019-02-16 NOTE — Progress Notes (Signed)
  HPI:      [name] is a 73 y.o. [id number] who presents today for her pessary follow up and examination related to her pelvic floor weakening.  Pt reports tolerating the pessary well with occas vaginal bleeding and no vaginal discharge.  Symptoms of pelvic floor weakening have greatly improved. She is voiding and defecating without difficulty. She currently has a #3 Gellhorn pessary.  PMHx: She  has a past medical history of Hypertension and Thyroid disease. Also,  has a past surgical history that includes Bunionectomy (Left, 2006); Tubal ligation (1969); Image guided sinus surgery (N/A, 02/24/2018); Maxillary antrostomy (Left, 02/24/2018); Ethmoidectomy (Left, 02/24/2018); Frontal sinus exploration (Left, 02/24/2018); Sphenoidectomy (Left, 02/24/2018); and Abdominal hysterectomy (1980)., family history includes Cancer in her mother; Celiac disease in her mother; Hypertension in her father; Lymphoma in her father.,  reports that she quit smoking about 25 years ago. She has never used smokeless tobacco. She reports that she does not drink alcohol or use drugs.  She has a current medication list which includes the following prescription(s): biotin, vitamin d, ibuprofen, levothyroxine, lutein, multivitamin, potassium, turmeric, and zinc gluconate. Also, is allergic to other.  Review of Systems  All other systems reviewed and are negative.   Objective: BP (!) 150/98   Ht 5\' 2"  (1.575 m)   Wt 136 lb (61.7 kg)   BMI 24.87 kg/m  Physical Exam Constitutional:      General: She is not in acute distress.    Appearance: She is well-developed.  Genitourinary:     Pelvic exam was performed with patient supine.     Vagina normal.     No vaginal erythema or bleeding.     Genitourinary Comments: Cuff intact/ no lesions Cystocele and vag prolapse noted Area of contact bleeding at apex related to pessary use, no ulcer Absent uterus and cervix  HENT:     Head: Normocephalic and atraumatic.     Nose: Nose  normal.  Abdominal:     General: There is no distension.     Palpations: Abdomen is soft.     Tenderness: There is no abdominal tenderness.  Musculoskeletal: Normal range of motion.  Neurological:     Mental Status: She is alert and oriented to person, place, and time.     Cranial Nerves: No cranial nerve deficit.  Skin:    General: Skin is warm and dry.     Pessary Care Pessary removed and cleaned.  Vagina checked - without erosions - pessary replaced.  A/P: Pessary was cleaned and replaced today.    Plan to change size of pessary to see if reduces bleeding     Also can consider vag estrogen therapy, although she is not too keen on that idea Instructions given for care. Concerning symptoms to observe for are counseled to patient. Follow up scheduled for 3 months.  A total of 15 minutes were spent face-to-face with the patient during this encounter and over half of that time dealt with counseling and coordination of care.  Barnett Applebaum, MD, [facility], [facility] 02/16/2019  11:02 AM

## 2019-02-16 NOTE — Telephone Encounter (Signed)
Left detailed VM. CRM created.  

## 2019-02-18 ENCOUNTER — Encounter: Payer: Self-pay | Admitting: Family Medicine

## 2019-03-17 DIAGNOSIS — K116 Mucocele of salivary gland: Secondary | ICD-10-CM | POA: Diagnosis not present

## 2019-03-17 DIAGNOSIS — D140 Benign neoplasm of middle ear, nasal cavity and accessory sinuses: Secondary | ICD-10-CM | POA: Diagnosis not present

## 2019-05-19 ENCOUNTER — Encounter: Payer: Self-pay | Admitting: Obstetrics & Gynecology

## 2019-05-19 ENCOUNTER — Ambulatory Visit (INDEPENDENT_AMBULATORY_CARE_PROVIDER_SITE_OTHER): Payer: Medicare Other | Admitting: Obstetrics & Gynecology

## 2019-05-19 ENCOUNTER — Other Ambulatory Visit: Payer: Self-pay

## 2019-05-19 VITALS — Ht 62.0 in | Wt 136.0 lb

## 2019-05-19 DIAGNOSIS — N993 Prolapse of vaginal vault after hysterectomy: Secondary | ICD-10-CM | POA: Diagnosis not present

## 2019-05-19 DIAGNOSIS — N952 Postmenopausal atrophic vaginitis: Secondary | ICD-10-CM

## 2019-05-19 DIAGNOSIS — N8111 Cystocele, midline: Secondary | ICD-10-CM | POA: Diagnosis not present

## 2019-05-19 NOTE — Progress Notes (Signed)
  HPI:      [name] is a 73 y.o. [id number] who presents today for her pessary follow up and examination related to her pelvic floor weakening.  Pt reports tolerating the pessary well with no recent vaginal bleeding (just after last exchange) and no vaginal discharge.  Symptoms of pelvic floor weakening have greatly improved. She is voiding and defecating without difficulty. She currently has a Gellhorn #3 pessary.  She requests a holiday from the pessary to see if she still needs it and also to see if some left sided pelvic pressure persists or improves with its discontinuation.  PMHx: She  has a past medical history of Hypertension and Thyroid disease. Also,  has a past surgical history that includes Bunionectomy (Left, 2006); Tubal ligation (1969); Image guided sinus surgery (N/A, 02/24/2018); Maxillary antrostomy (Left, 02/24/2018); Ethmoidectomy (Left, 02/24/2018); Frontal sinus exploration (Left, 02/24/2018); Sphenoidectomy (Left, 02/24/2018); and Abdominal hysterectomy (1980)., family history includes Cancer in her mother; Celiac disease in her mother; Hypertension in her father; Lymphoma in her father.,  reports that she quit smoking about 25 years ago. She has never used smokeless tobacco. She reports that she does not drink alcohol or use drugs.  She has a current medication list which includes the following prescription(s): biotin, vitamin d, ibuprofen, levothyroxine, lutein, multivitamin, potassium, turmeric, and zinc gluconate. Also, is allergic to other.  Review of Systems  All other systems reviewed and are negative.   Objective: Ht 5\' 2"  (1.575 m)   Wt 136 lb (61.7 kg)   BMI 24.87 kg/m  Physical Exam Constitutional:      General: She is not in acute distress.    Appearance: She is well-developed.  Genitourinary:     Pelvic exam was performed with patient supine.     Vagina normal.     No vaginal erythema or bleeding.     Genitourinary Comments: Pt refuse speculum exam today   HENT:     Head: Normocephalic and atraumatic.     Nose: Nose normal.  Abdominal:     General: There is no distension.     Palpations: Abdomen is soft.     Tenderness: There is no abdominal tenderness.  Musculoskeletal: Normal range of motion.  Neurological:     Mental Status: She is alert and oriented to person, place, and time.     Cranial Nerves: No cranial nerve deficit.  Skin:    General: Skin is warm and dry.    Pessary Care Pessary removed and cleaned.  Placed in bag to take home and return w if needed to be replaced.  Exam confirms proper size, should not go any smaller.  A/P:1. Prolapse of vaginal vault after hysterectomy 2. Cystocele, midline 3. Vaginal atrophy  Pessary holiday requested.  Will leave out and follow symptoms closely.  Replace in 4 weeks.  A total of 15 minutes were spent face-to-face with the patient during this encounter and over half of that time dealt with counseling and coordination of care.  Barnett Applebaum, MD, [facility], [facility] 05/19/2019  10:45 AM

## 2019-06-14 ENCOUNTER — Telehealth: Payer: Self-pay

## 2019-06-14 NOTE — Telephone Encounter (Signed)
Let her know I would not go any smaller with that type of pessary (as I assessed for that potential at last appt) Thus, we could try a different shape type pessary, but she did not do well w those in the past So truly surgery is her next best option. Can reschedule appt based on her desire to plan and schedule surgery; or she can try without anything for a while longer

## 2019-06-14 NOTE — Telephone Encounter (Signed)
Sure. Order a Gehrung #2 pessary and reschedule for after this one arrives. (not Gellhorn, but [place])

## 2019-06-14 NOTE — Telephone Encounter (Signed)
Pt has apt 06/22/2019 w/[facility] for Pessary replacement. It was removed at last apt & planned to replace at the next apt. She states it will not be able to be replaced as she has discovered it has been the source of her leg/groin pain for some time now. Her leg is almost totally back to normal now. She would need something different size/shape until she is able to schedule surgery. Patient would like a return call to notify her if the apt needs to be canceled or if [facility] would like to discuss with her further. Cb#[phone]

## 2019-06-14 NOTE — Telephone Encounter (Signed)
Notified of [facility] response. Patient states she has only tried two types. She feels there has to be more options than two. She isn't able to do surgery right now, but also can't be without a pessary or some type of support for several months. She needs something that isn't going to cause her more problems like the last one. She is suggesting [facility] discuss w/other OB GYN's, partners or someone from [facility] to see if they have any suggestions that he may not be aware of. She will keep her appointment for next week until she hears back from [place].

## 2019-06-15 NOTE — Telephone Encounter (Signed)
Patient aware. Will keep apt for 06/22/2019 as pessary may arrive by that time. If it hasn't arrived by 06/21/2019, I will contact patient to notify & r/s apt once pessary has been delivered.

## 2019-06-21 NOTE — Telephone Encounter (Signed)
Notified patient the pessary has arrive to [facility]. It should be delivered to our office first thing in the morning. She will call me if she doesn't hear anything by 8:30 am tomorrow.

## 2019-06-21 NOTE — Telephone Encounter (Signed)
Emailed [employer] to track the order. Order shows delivered to warehouse today around noon. Awaiting confirmation that it will be delivered to [place] first thing in the a.m.

## 2019-06-22 ENCOUNTER — Encounter: Payer: Self-pay | Admitting: Obstetrics & Gynecology

## 2019-06-22 ENCOUNTER — Other Ambulatory Visit: Payer: Self-pay

## 2019-06-22 ENCOUNTER — Ambulatory Visit (INDEPENDENT_AMBULATORY_CARE_PROVIDER_SITE_OTHER): Payer: Medicare Other | Admitting: Obstetrics & Gynecology

## 2019-06-22 DIAGNOSIS — N8111 Cystocele, midline: Secondary | ICD-10-CM | POA: Diagnosis not present

## 2019-06-22 DIAGNOSIS — N993 Prolapse of vaginal vault after hysterectomy: Secondary | ICD-10-CM

## 2019-06-22 NOTE — Telephone Encounter (Signed)
Pt notified pessary has arrived at our office & available for her apt today.

## 2019-06-22 NOTE — Progress Notes (Signed)
  HPI:      [name] is a 73 y.o. [id number] who presents today for her pessary follow up and examination related to her pelvic floor weakening.  She has not done well without a pessary so comes in for Gehrung pessary #2 placement Does not desire surgery Has sx's of vaginal pressure and discomfort Noted left leg pain improvement with cessation of gellhorn type pessary    PMHx: She  has a past medical history of Hypertension and Thyroid disease. Also,  has a past surgical history that includes Bunionectomy (Left, 2006); Tubal ligation (1969); Image guided sinus surgery (N/A, 02/24/2018); Maxillary antrostomy (Left, 02/24/2018); Ethmoidectomy (Left, 02/24/2018); Frontal sinus exploration (Left, 02/24/2018); Sphenoidectomy (Left, 02/24/2018); and Abdominal hysterectomy (1980)., family history includes Cancer in her mother; Celiac disease in her mother; Hypertension in her father; Lymphoma in her father.,  reports that she quit smoking about 25 years ago. She has never used smokeless tobacco. She reports that she does not drink alcohol or use drugs.  She has a current medication list which includes the following prescription(s): biotin, vitamin d, ibuprofen, levothyroxine, lutein, multivitamin, potassium, turmeric, and zinc gluconate. Also, is allergic to other.  Review of Systems  All other systems reviewed and are negative.   Objective: Ht 5\' 2"  (1.575 m)   Wt 132 lb (59.9 kg)   BMI 24.14 kg/m  Physical Exam Constitutional:      General: She is not in acute distress.    Appearance: She is well-developed.  Genitourinary:     Pelvic exam was performed with patient supine.     Vagina normal.     No vaginal erythema or bleeding.     Genitourinary Comments: Cuff intact/ no lesions Vaginal vault prolapse and cystocele noted Absent uterus and cervix  HENT:     Head: Normocephalic and atraumatic.     Nose: Nose normal.  Abdominal:     General: There is no distension.     Palpations: Abdomen is  soft.     Tenderness: There is no abdominal tenderness.  Musculoskeletal: Normal range of motion.  Neurological:     Mental Status: She is alert and oriented to person, place, and time.     Cranial Nerves: No cranial nerve deficit.  Skin:    General: Skin is warm and dry.    A/P:   ICD-10-CM   1. Cystocele, midline  N81.11   2. Prolapse of vaginal vault after hysterectomy  N99.3   Pessary- new type-  was placed today.  Gehrung #2.    Room for increasing size, but smaller size makes tolerability better especially w cleanings/replacements Instructions given for care. Concerning symptoms to observe for are counseled to patient. Follow up scheduled for 1 months.  A total of 15 minutes were spent face-to-face with the patient during this encounter and over half of that time dealt with counseling and coordination of care.  Barnett Applebaum, MD, [facility], [facility] 06/22/2019  10:28 AM

## 2019-07-01 IMAGING — MR MR LUMBAR SPINE W/O CM
5 of 7 series · 29 of 48 positions shown · non-contrast
Comparison: None.

CLINICAL DATA: Chronic low back pain for 15 years. Left leg
weakness for 3-4 months.

EXAM:
MRI LUMBAR SPINE WITHOUT CONTRAST
TECHNIQUE: Multiplanar, multisequence MR imaging of the lumbar spine was
performed. No intravenous contrast was administered.

[Series 5: T2 · sagittal · 4.0mm · 0.81mm/px · 6 of 17 slices shown (1 of 3)]
[im 1/17]
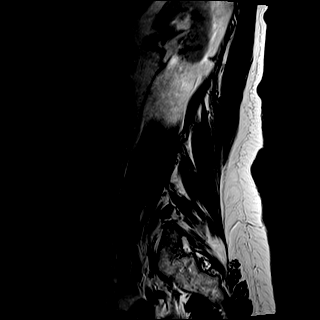
[im 4/17]
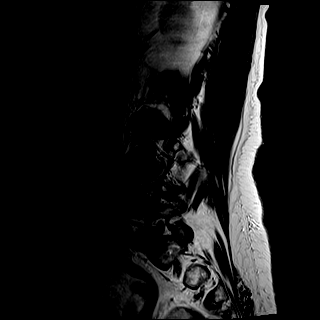
[im 7/17]
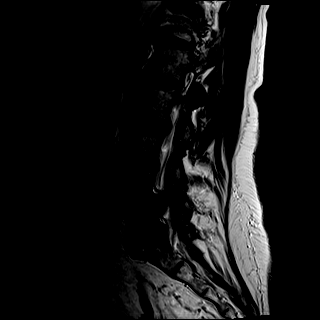
[im 10/17]
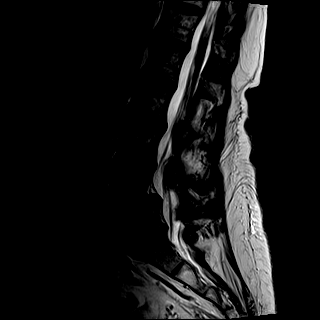
[im 13/17]
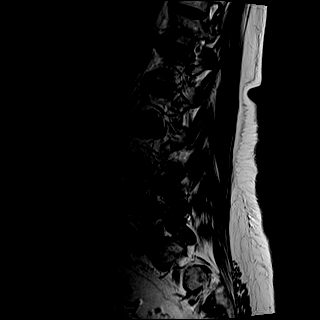
[im 17/17]
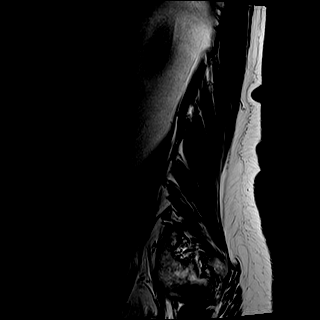

[Series 6: T1 · sagittal · 4.0mm · 0.81mm/px · 6 of 17 slices shown (1 of 2)]
[im 1/17]
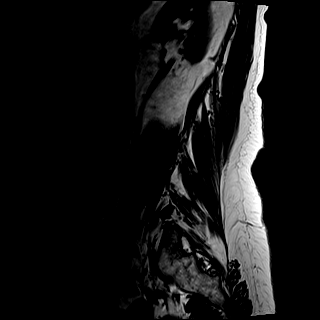
[im 4/17]
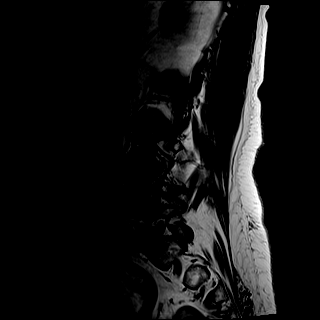
[im 7/17]
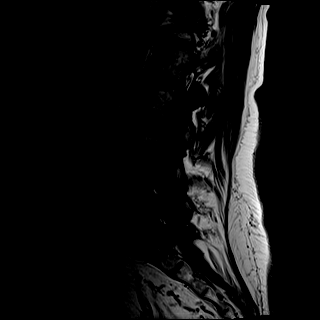
[im 10/17]
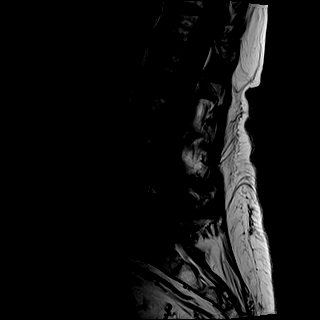
[im 13/17]
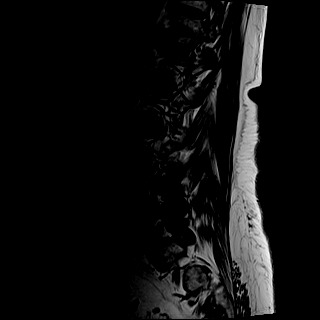
[im 17/17]
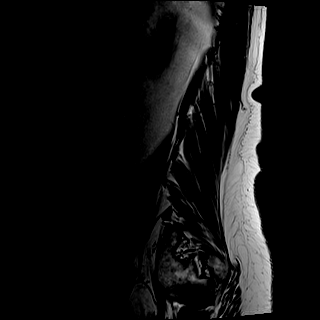

[Series 8: T2 · axial · 4.0mm · 0.52mm/px · z∈[-23,+104]mm · 8 of 27 slices shown (2 of 3)]
[im 1/27]
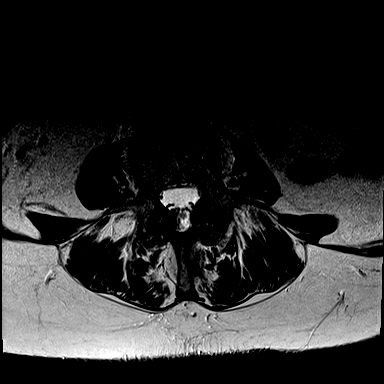
[im 3/27]
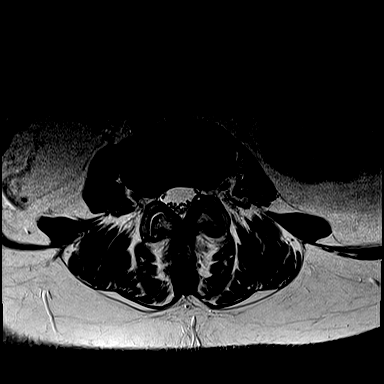
[im 9/27]
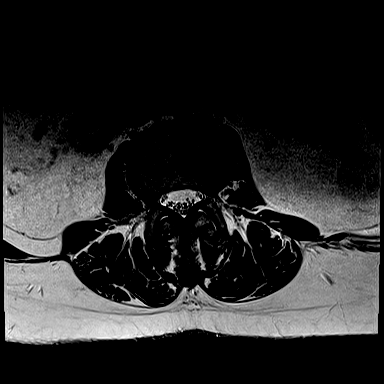
[im 12/27]
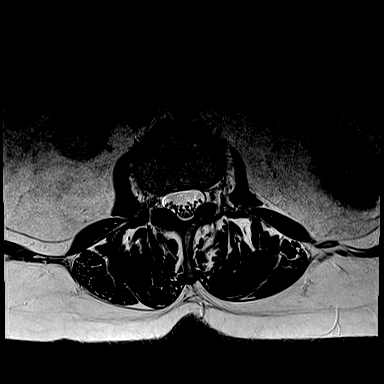
[im 15/27]
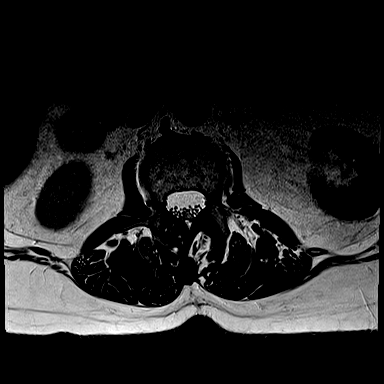
[im 18/27]
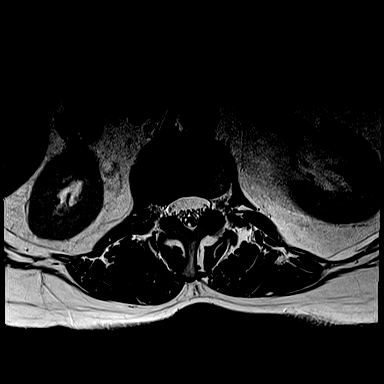
[im 24/27]
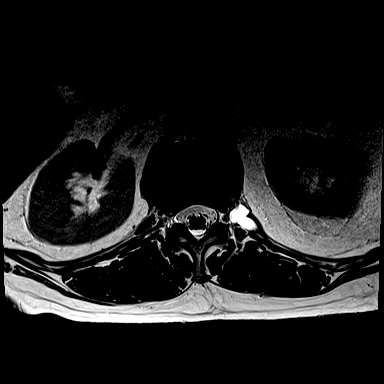
[im 27/27]
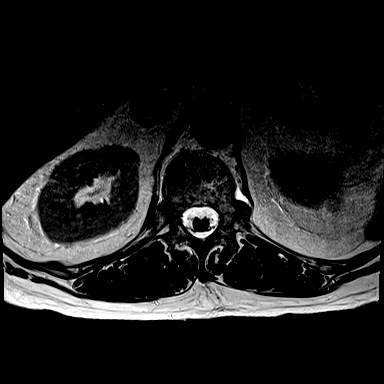

[Series 9: T2 · axial · 4.0mm · 0.52mm/px · z∈[-121,-51]mm · 5 of 15 slices shown (3 of 3)]
[im 1/15]
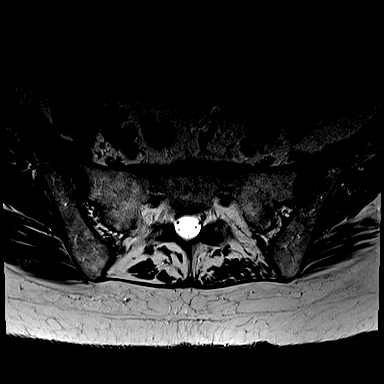
[im 4/15]
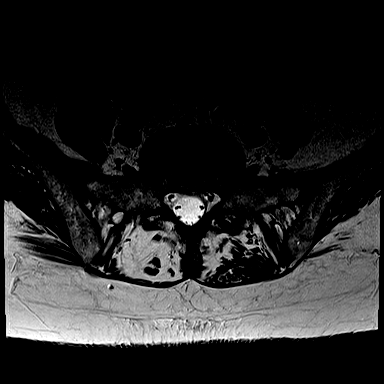
[im 8/15]
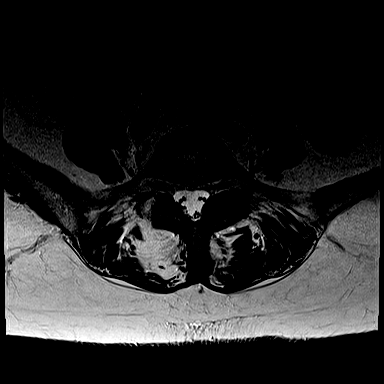
[im 11/15]
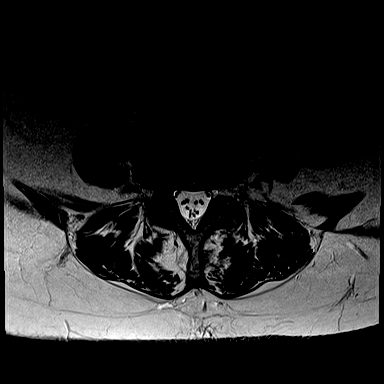
[im 15/15]
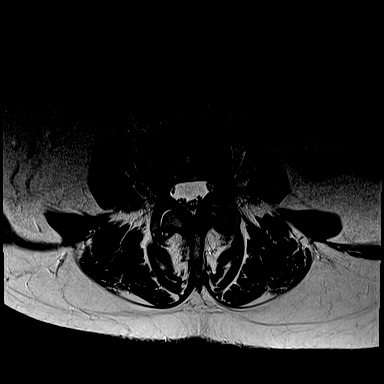

[Series 10: T1 · axial · 4.0mm · 0.31mm/px · z∈[-23,+31]mm · 4 of 27 slices shown (2 of 2)]
[im 1/27]
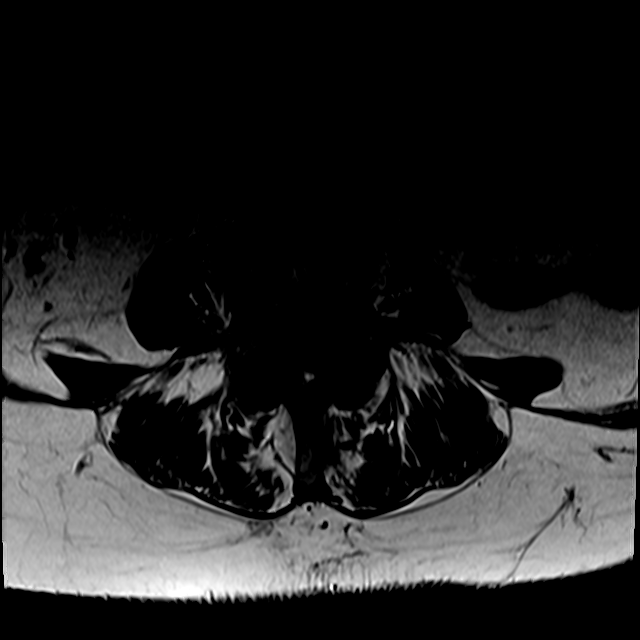
[im 3/27]
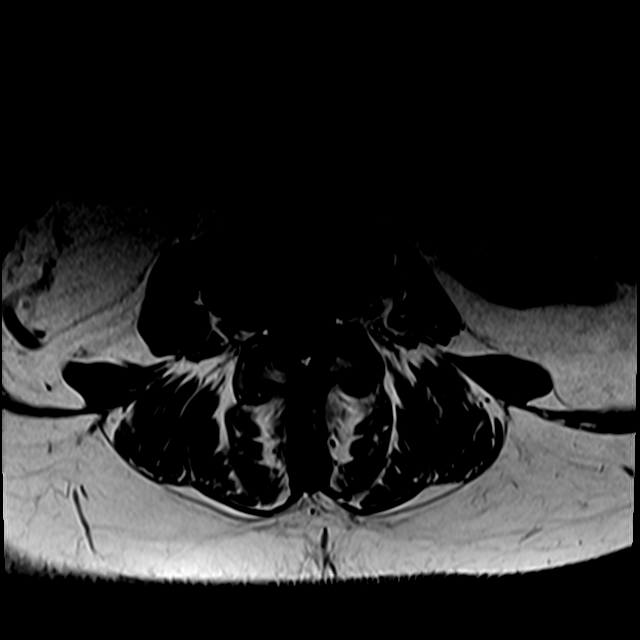
[im 9/27]
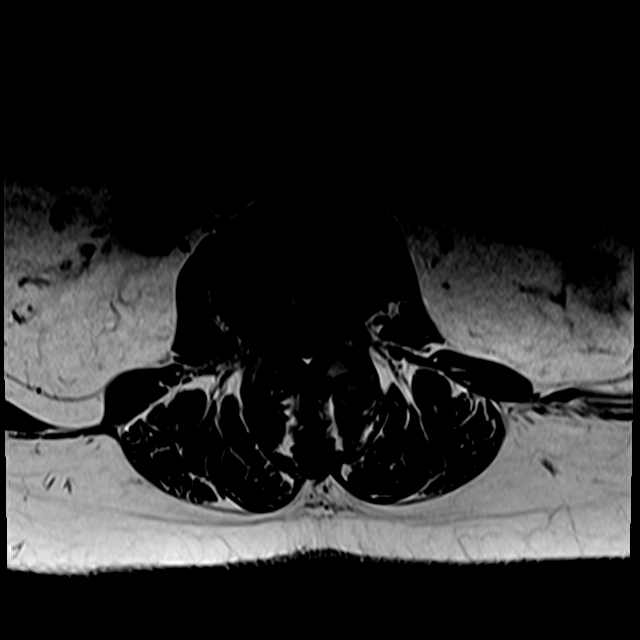
[im 12/27]
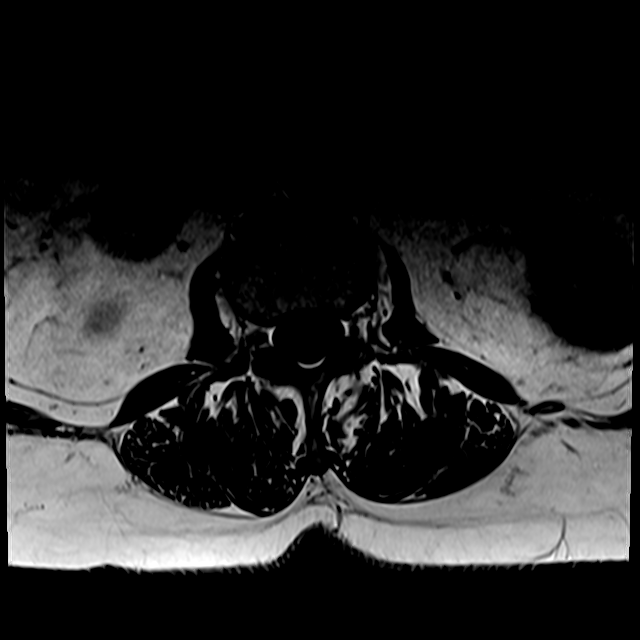

[29 of 48 positions shown; findings below may reference images not displayed]

FINDINGS: Segmentation: There are five lumbar type vertebral bodies. The last
full intervertebral disc space is labeled L5-S1.

Alignment: Bilateral pars defects at L4 with a grade 1-2
spondylolisthesis and associated severe disc disease and facet
disease at L4-5. The other lumbar vertebral bodies are normally
aligned.

Vertebrae: Normal marrow signal except for endplate reactive changes
at L4-5. No worrisome bone lesions or fracture.

Conus medullaris and cauda equina: Conus extends to the L1 level.
Conus and cauda equina appear normal.

Paraspinal and other soft tissues: No significant findings. There is
a cystic area noted on the left side at T12 which could be a
perineural cyst or other benign ganglion cyst.

Disc levels:

T12-L1: No significant findings.

L1-2: No significant findings.

L2-3: Diffuse annular bulge with mild flattening of the ventral
thecal sac and mild bilateral lateral recess encroachment. No focal
disc protrusion or foraminal stenosis. Mild facet disease.

L3-4: Mild disc disease and facet disease but no focal disc
protrusions, spinal or foraminal stenosis. Mild lateral recess
encroachment bilaterally. There is a small left-sided synovial cyst
measuring 4.5 mm with mild impression on the posterolateral aspect
of the thecal sac and may contribute to left lateral recess stenosis

L4-5: Bilateral pars defects and associated severe facet disease and
disc disease. Bulging degenerated uncovered disc but the spinal
canal is generous and there is no spinal or significant lateral
recess stenosis. There is moderate bilateral foraminal stenosis.

L5-S1: Bilateral pars defects at L5 but no spondylolisthesis. No
spinal or lateral recess stenosis. Mild bilateral foraminal
stenosis.
IMPRESSION: 1. Mild bulging annulus and small left-sided synovial cyst at L3-4
contributing to mild bilateral lateral recess stenosis, left greater
than right.
2. Bilateral pars defects at L4 with a grade 1-2 spondylolisthesis
and associated severe disc disease and facet disease at L4-5.
Widened canal without evidence of spinal or lateral recess stenosis
but there is moderate bilateral foraminal stenosis.
3. Bilateral pars defects at L5 but no spondylolisthesis and no
significant spinal or lateral recess stenosis. Mild bilateral
foraminal stenosis.

## 2019-07-20 ENCOUNTER — Ambulatory Visit: Payer: Medicare Other | Admitting: Obstetrics & Gynecology

## 2019-08-16 DIAGNOSIS — K116 Mucocele of salivary gland: Secondary | ICD-10-CM | POA: Diagnosis not present

## 2019-08-16 DIAGNOSIS — D140 Benign neoplasm of middle ear, nasal cavity and accessory sinuses: Secondary | ICD-10-CM | POA: Diagnosis not present

## 2019-08-28 DIAGNOSIS — Z23 Encounter for immunization: Secondary | ICD-10-CM | POA: Diagnosis not present

## 2019-09-07 ENCOUNTER — Telehealth: Payer: Self-pay | Admitting: Family Medicine

## 2019-09-07 DIAGNOSIS — E039 Hypothyroidism, unspecified: Secondary | ICD-10-CM

## 2019-09-07 MED ORDER — LEVOTHYROXINE SODIUM 200 MCG PO TABS: 100.0000 ug | ORAL_TABLET | Freq: Every day | ORAL | 0 refills | Status: DC

## 2019-09-07 NOTE — Telephone Encounter (Signed)
Medication Refill - Medication: levothyroxine (SYNTHROID) 200 MCG tablet  Has the patient contacted their pharmacy? Yes - pt states that the pharmacy told her to go to the office because she can't afford the Synthroid and needs to be changed to the generic.  Pt would like a 90 day supply called in for her. (Agent: If no, request that the patient contact the pharmacy for the refill.) (Agent: If yes, when and what did the pharmacy advise?)  Preferred Pharmacy (with phone number or street name):  [facility], [place] [phone] (Phone) [phone] (Fax)   Agent: Please be advised that RX refills may take up to 3 business days. We ask that you follow-up with your pharmacy.

## 2019-09-07 NOTE — Telephone Encounter (Signed)
lvm for scheduling °

## 2019-09-07 NOTE — Telephone Encounter (Signed)
Please schedule patient for follow up in the next 15 days.  

## 2019-09-08 NOTE — Telephone Encounter (Signed)
Pt states she needs generic levothyroxine because name brand brand has been too expensive.  Pt is very upset she needs appt to be seen for her medication. Pt is scheduled for phone visit tomorrow

## 2019-09-09 ENCOUNTER — Other Ambulatory Visit: Payer: Self-pay

## 2019-09-09 ENCOUNTER — Encounter: Payer: Self-pay | Admitting: Family Medicine

## 2019-09-09 ENCOUNTER — Ambulatory Visit (INDEPENDENT_AMBULATORY_CARE_PROVIDER_SITE_OTHER): Payer: Medicare Other | Admitting: Family Medicine

## 2019-09-09 VITALS — Temp 97.6°F | Ht 62.0 in | Wt 132.0 lb

## 2019-09-09 DIAGNOSIS — G8929 Other chronic pain: Secondary | ICD-10-CM

## 2019-09-09 DIAGNOSIS — E039 Hypothyroidism, unspecified: Secondary | ICD-10-CM

## 2019-09-09 DIAGNOSIS — M25511 Pain in right shoulder: Secondary | ICD-10-CM

## 2019-09-09 DIAGNOSIS — M19011 Primary osteoarthritis, right shoulder: Secondary | ICD-10-CM | POA: Diagnosis not present

## 2019-09-09 DIAGNOSIS — M4316 Spondylolisthesis, lumbar region: Secondary | ICD-10-CM

## 2019-09-09 DIAGNOSIS — N8111 Cystocele, midline: Secondary | ICD-10-CM | POA: Diagnosis not present

## 2019-09-09 DIAGNOSIS — I10 Essential (primary) hypertension: Secondary | ICD-10-CM | POA: Diagnosis not present

## 2019-09-09 DIAGNOSIS — M2011 Hallux valgus (acquired), right foot: Secondary | ICD-10-CM | POA: Diagnosis not present

## 2019-09-09 MED ORDER — LEVOTHYROXINE SODIUM 100 MCG PO TABS: 100.0000 ug | ORAL_TABLET | Freq: Every day | ORAL | 1 refills | Status: DC

## 2019-09-09 NOTE — Progress Notes (Signed)
Name: Annette Stevens   MRN: [id number]    DOB: 1946-10-15   Date:09/09/2019       Progress Note  Subjective  Chief Complaint  Chief Complaint  Patient presents with  . Follow-up  . Medication Refill  . Hypothyroidism    I connected with  Annette Stevens on 09/09/19 at 11:20 AM EST by telephone and verified that I am speaking with the correct person using two identifiers.  I discussed the limitations, risks, security and privacy concerns of performing an evaluation and management service by telephone and the availability of in person appointments. Staff also discussed with the patient that there may be a patient responsible charge related to this service. Patient Location: Home Provider Location: Office  Additional Individuals present: None  HPI  HTN: Not checking BP's at home.  Denies chest pain, shortness of breath, BLE edema, vision changes, headaches.  She takes turmeric as she has had reactions to "every single BP medication".  She avoids salt and monitors sodium intake.   Hypothyroidism: History: hypothyroidism Current Medication Regimen: 122mcg synthroid - brand name only, but this has been too expensive, so we will switch to generic and recheck labs in 6-8 weeks. Current Symptoms: denies fatigue, weight changes, heat/cold intolerance, bowel/skin changes or CVS symptoms Most recent results are below; we will be repeating labs today. Lab Results  Component Value Date   TSH 1.06 12/31/2018   HLD: She adamantly refuses statin therapy - sister did get myaglias from statin therapy.  Her HDL is quite good at 87 back in March 2020.  Will not recheck this visit.  Hallux valgus of right foot: saw podiatrist and patient is not keen on having surgery done; unchanged since last visit.  OA of the knee, hips, R Shoulder pain; chronic back pain: turmeric is helping; activity level affects this. She states has seen Ortho recommended avoided scope and they want to do a whole shoulder  replacement ([facility]).  Limited ROM in shoulder.  Jointflex supplement does seem to help; she also takes ibuprofen PRN but it does not help significantly.  Cystocoele: managed by Dr. Kenton Kingfisher, gyn   Patient Active Problem List   Diagnosis Date Noted  . Lung nodules 12/17/2018  . Spondylolysis 11/18/2018  . Sciatica of left side 11/18/2018  . Chronic right shoulder pain 11/18/2018  . Hallux valgus of right foot 11/18/2018  . Spondylolisthesis at L4-L5 level 11/18/2018  . Spondylolisthesis 12/10/2017  . Osteoarthritis of knee 12/10/2017  . Degenerative joint disease of shoulder region 12/10/2017  . Grieving 12/10/2017  . Lipoma of left lower extremity 12/10/2017  . Sebaceous cyst 12/10/2017  . Cystocele, midline 04/15/2017  . 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 Essential hypertension, benign 09/13/2016  . Hypothyroidism 09/13/2016  . Vaginal atrophy 09/29/2012    Past Surgical History:  Procedure Laterality Date  . ABDOMINAL HYSTERECTOMY  1980   partial still has ovaries  . BUNIONECTOMY Left 2006  . ETHMOIDECTOMY Left 02/24/2018   Procedure: ETHMOIDECTOMY;  Surgeon: Clyde Canterbury, MD;  Location: [facility];  Service: ENT;  Laterality: Left;  . FRONTAL SINUS EXPLORATION Left 02/24/2018   Procedure: FRONTAL SINUS EXPLORATION;  Surgeon: Clyde Canterbury, MD;  Location: [facility];  Service: ENT;  Laterality: Left;  . IMAGE GUIDED SINUS SURGERY N/A 02/24/2018   Procedure: IMAGE GUIDED SINUS SURGERY;  Surgeon: Clyde Canterbury, MD;  Location: [facility];  Service: ENT;  Laterality: N/A;  GAVE DISK BRENDA 3-21 KP  . MAXILLARY  ANTROSTOMY Left 02/24/2018   Procedure: ENDSOCPIC MAXILLARY ANTROSTOMY TOTAL;  Surgeon: Clyde Canterbury, MD;  Location: [facility];  Service: ENT;  Laterality: Left;  . SPHENOIDECTOMY Left 02/24/2018   Procedure: SPHENOIDECTOMY;  Surgeon: Clyde Canterbury, MD;  Location: [facility];  Service: ENT;  Laterality: Left;   . TUBAL LIGATION  1969    Family History  Problem Relation Age of Onset  . Cancer Mother        colon  . Celiac disease Mother   . Lymphoma Father   . Hypertension Father    Social History   Socioeconomic History  . Marital status: Widowed    Spouse name: Not on file  . Number of children: 2  . Years of education: Not on file  . Highest education level: High school graduate  Occupational History  . Not on file  Social Needs  . Financial resource strain: Not hard at all  . Food insecurity    Worry: Never true    Inability: Never true  . Transportation needs    Medical: No    Non-medical: No  Tobacco Use  . Smoking status: Former Smoker    Quit date: 1995    Years since quitting: 25.9  . Smokeless tobacco: Never Used  Substance and Sexual Activity  . Alcohol use: No  . Drug use: No  . Sexual activity: Not on file  Lifestyle  . Physical activity    Days per week: 0 days    Minutes per session: 0 min  . Stress: Only a little  Relationships  . Social Herbalist on phone: Patient refused    Gets together: Patient refused    Attends religious service: Patient refused    Active member of club or organization: Patient refused    Attends meetings of clubs or organizations: Patient refused    Relationship status: Patient refused  . Intimate partner violence    Fear of current or ex partner: No    Emotionally abused: No    Physically abused: No    Forced sexual activity: No  Other Topics Concern  . Not on file  Social History Narrative  . Not on file     Current Outpatient Medications:  .  Biotin 10000 MCG TABS, Take 1 tablet by mouth daily. JUST on four days of the week, Disp: 30 tablet, Rfl:  .  Cholecalciferol (VITAMIN D) 50 MCG (2000 UT) CAPS, Take 1 capsule (2,000 Units total) by mouth daily., Disp: , Rfl:  .  ibuprofen (ADVIL,MOTRIN) 200 MG tablet, Take 200 mg by mouth every 6 (six) hours as needed., Disp: , Rfl:  .  Lutein 10 MG TABS,  HerbaVision Lutein with bilberry, one by mouth daily, Disp: , Rfl:  .  Potassium 99 MG TABS, Take 1 tablet by mouth daily., Disp: , Rfl:  .  Turmeric 500 MG CAPS, Take 1 tablet by mouth 2 (two) times daily., Disp: , Rfl:  .  zinc gluconate 50 MG tablet, Take 50 mg by mouth daily., Disp: , Rfl:  .  levothyroxine (SYNTHROID) 100 MCG tablet, Take 1 tablet (100 mcg total) by mouth daily., Disp: 90 tablet, Rfl: 1 .  Multiple Vitamin (MULTIVITAMIN) capsule, Take 1 capsule by mouth daily., Disp: , Rfl:   Allergies  Allergen Reactions  . Other Other (See Comments)    Blood pressure medications    I personally reviewed active problem list, medication list, allergies, health maintenance, notes from last encounter, lab results  with the patient/caregiver today.   ROS  Constitutional: Negative for fever or weight change.  Respiratory: Negative for cough and shortness of breath.   Cardiovascular: Negative for chest pain or palpitations.  Gastrointestinal: Negative for abdominal pain, no bowel changes.  Musculoskeletal: Negative for gait problem or joint swelling.  Skin: Negative for rash.  Neurological: Negative for dizziness or headache.  No other specific complaints in a complete review of systems (except as listed in HPI above).  Objective  Virtual encounter, vitals not obtained.  Body mass index is 24.14 kg/m.  Physical Exam  Pulmonary/Chest: Effort normal. No respiratory distress. Speaking in complete sentences Neurological: Pt is alert and oriented to person, place, and time. Speech is normal. Psychiatric: Patient has a normal mood and affect. behavior is normal. Judgment and thought content normal.  No results found for this or any previous visit (from the past 72 hour(s)).  Fall Risk: Fall Risk  09/09/2019 12/31/2018 11/18/2018 12/10/2017 09/03/2016  Falls in the past year? 0 0 0 No Yes  Number falls in past yr: 0 0 0 - 1  Injury with Fall? 0 0 0 - No  Risk for fall due to : - -  - Impaired balance/gait -  Follow up - Falls prevention discussed - - -   Assessment & Plan  1. Essential hypertension, benign - Unknown BP - needs to come in for BP check - plan to do with TSH recheck in 6 weeks. - BMP w/ GFR - [facility]  2. Hypothyroidism, unspecified type - levothyroxine (SYNTHROID) 100 MCG tablet; Take 1 tablet (100 mcg total) by mouth daily.  Dispense: 90 tablet; Refill: 1 - TSH + Panel  3. Hallux valgus of right foot - No longer following up with podiatry  4. Primary osteoarthritis of right shoulder 5. Spondylolisthesis at L4-L5 level 6. Chronic right shoulder pain - Does not want to see Ortho at this time  7. Cystocele, midline - Seeing GYN  I discussed the assessment and treatment plan with the patient. The patient was provided an opportunity to ask questions and all were answered. The patient agreed with the plan and demonstrated an understanding of the instructions.   The patient was advised to call back or seek an in-person evaluation if the symptoms worsen or if the condition fails to improve as anticipated.  I provided 27 minutes of non-face-to-face time during this encounter.  Hubbard Hartshorn, FNP

## 2019-09-15 ENCOUNTER — Encounter: Payer: Self-pay | Admitting: Obstetrics & Gynecology

## 2019-09-15 ENCOUNTER — Ambulatory Visit (INDEPENDENT_AMBULATORY_CARE_PROVIDER_SITE_OTHER): Payer: Medicare Other | Admitting: Obstetrics & Gynecology

## 2019-09-15 ENCOUNTER — Other Ambulatory Visit: Payer: Self-pay

## 2019-09-15 VITALS — Temp 95.8°F | Ht 62.0 in | Wt 133.0 lb

## 2019-09-15 DIAGNOSIS — N993 Prolapse of vaginal vault after hysterectomy: Secondary | ICD-10-CM

## 2019-09-15 DIAGNOSIS — N8111 Cystocele, midline: Secondary | ICD-10-CM

## 2019-09-15 NOTE — Progress Notes (Signed)
  HPI:      Ms. Chantrice Bohnen is a 73 y.o. [id number] who presents today for her pessary follow up and examination related to her pelvic floor weakening.  Pt reports tolerating the pessary well with scant vaginal bleeding and  no vaginal discharge.  Symptoms of pelvic floor weakening have greatly improved w this pessary and less side effects. She is voiding and defecating without difficulty. She currently has a Gehrung #2 pessary.  PMHx: She  has a past medical history of Hypertension and Thyroid disease. Also,  has a past surgical history that includes Bunionectomy (Left, 2006); Tubal ligation (1969); Image guided sinus surgery (N/A, 02/24/2018); Maxillary antrostomy (Left, 02/24/2018); Ethmoidectomy (Left, 02/24/2018); Frontal sinus exploration (Left, 02/24/2018); Sphenoidectomy (Left, 02/24/2018); and Abdominal hysterectomy (1980)., family history includes Cancer in her mother; Celiac disease in her mother; Hypertension in her father; Lymphoma in her father.,  reports that she quit smoking about 25 years ago. She has never used smokeless tobacco. She reports that she does not drink alcohol or use drugs.  She has a current medication list which includes the following prescription(s): biotin, vitamin d, ibuprofen, levothyroxine, lutein, multivitamin, potassium, turmeric, and zinc gluconate. Also, is allergic to other.  Review of Systems  All other systems reviewed and are negative.   Objective: Temp (!) 95.8 F (35.4 C)   Ht 5\' 2"  (1.575 m)   Wt 133 lb (60.3 kg)   BMI 24.33 kg/m  Physical Exam Constitutional:      General: She is not in acute distress.    Appearance: She is well-developed.  Genitourinary:     Pelvic exam was performed with patient supine.     Vagina normal.     No vaginal erythema or bleeding.     Genitourinary Comments: Cuff intact/ no lesions Vaginal vault prolapse and cystocele noted Absent uterus and cervix  HENT:     Head: Normocephalic and atraumatic.     Nose: Nose normal.   Abdominal:     General: There is no distension.     Palpations: Abdomen is soft.     Tenderness: There is no abdominal tenderness.  Musculoskeletal: Normal range of motion.  Neurological:     Mental Status: She is alert and oriented to person, place, and time.     Cranial Nerves: No cranial nerve deficit.  Skin:    General: Skin is warm and dry.  Psychiatric:        Attention and Perception: Attention normal.        Mood and Affect: Mood normal.        Speech: Speech normal.        Behavior: Behavior normal.        Cognition and Memory: Cognition normal.        Judgment: Judgment normal.    Pessary Care Pessary removed and cleaned.  Vagina checked - without erosions - pessary replaced.  A/P:   ICD-10-CM   1. Cystocele, midline  N81.11   2. Prolapse of vaginal vault after hysterectomy  N99.3    Pessary was cleaned and replaced today. Instructions given for care. Concerning symptoms to observe for are counseled to patient. Follow up scheduled for 3 months.  A total of 15 minutes were spent face-to-face with the patient during this encounter and over half of that time dealt with counseling and coordination of care.  Barnett Applebaum, MD, [facility], [facility] 09/15/2019  10:19 AM

## 2019-12-08 ENCOUNTER — Ambulatory Visit: Payer: Self-pay | Admitting: Family Medicine

## 2019-12-08 NOTE — Telephone Encounter (Signed)
Patient called with c/o diarrhea on and off x 2 weeks.  Two weeks ago this past Monday.  Patient denies fever, blood in stool, nausea or vomiting. Patient does c/o slight lower abdominal discomfort.  No discomfort today.  Patient has not had any recent antibiotics.  Patient reported having drank expired fruit flavored water and was wondering if this could have caused her diarrhea.  Diarrhea severity - MILD.  Adult Diarrhea Protocol, care advice and dispo followed. Virtual visit with Dr. Ancil Boozer on Friday, 12/10/2019.    Reason for Disposition . [1] MILD diarrhea (e.g., 1-3 or more stools than normal in past 24 hours) without known cause AND [2] present >  7 days  Answer Assessment - Initial Assessment Questions 1. DIARRHEA SEVERITY: "How bad is the diarrhea?" "How many extra stools have you had in the past 24 hours than normal?"    - NO DIARRHEA (SCALE 0)   - MILD (SCALE 1-3): Few loose or mushy BMs; increase of 1-3 stools over normal daily number of stools; mild increase in ostomy output.   -  MODERATE (SCALE 4-7): Increase of 4-6 stools daily over normal; moderate increase in ostomy output. * SEVERE (SCALE 8-10; OR 'WORST POSSIBLE'): Increase of 7 or more stools daily over normal; moderate increase in ostomy output; incontinence.     MILD 2. ONSET: "When did the diarrhea begin?"      2 weeks ago 3. BM CONSISTENCY: "How loose or watery is the diarrhea?"      No formed stools.  Watery and few pellets.  Have taken Pepto Bismol.  Then diarrhea gets better.   4. VOMITING: "Are you also vomiting?" If so, ask: "How many times in the past 24 hours?"      No vomiting nor nausea 5. ABDOMINAL PAIN: "Are you having any abdominal pain?" If yes: "What does it feel like?" (e.g., crampy, dull, intermittent, constant)      Lower abdominal  - slight discomfort.  Not really pain.   6. ABDOMINAL PAIN SEVERITY: If present, ask: "How bad is the pain?"  (e.g., Scale 1-10; mild, moderate, or severe)   - MILD (1-3):  doesn't interfere with normal activities, abdomen soft and not tender to touch    - MODERATE (4-7): interferes with normal activities or awakens from sleep, tender to touch    - SEVERE (8-10): excruciating pain, doubled over, unable to do any normal activities       DENIES PAIN.   7. ORAL INTAKE: If vomiting, "Have you been able to drink liquids?" "How much fluids have you had in the past 24 hours?"     Not drinking awful lot.  At least 3 - 8 ounces glasses per day.   8. HYDRATION: "Any signs of dehydration?" (e.g., dry mouth [not just dry lips], too weak to stand, dizziness, new weight loss) "When did you last urinate?"     Not eating a lot.  Eating yogurt every morning.  Patient's urine is not dark.   9. EXPOSURE: "Have you traveled to a foreign country recently?" "Have you been exposed to anyone with diarrhea?" "Could you have eaten any food that was spoiled?"     No - Expired bottle of fruit water 2 weeks ago.   10. ANTIBIOTIC USE: "Are you taking antibiotics now or have you taken antibiotics in the past 2 months?"       No  11. OTHER SYMPTOMS: "Do you have any other symptoms?" (e.g., fever, blood in stool)  highest temp 98.8  12. PREGNANCY: "Is there any chance you are pregnant?" "When was your last menstrual period?"       NA  Protocols used: DIARRHEA-A-AH

## 2019-12-10 ENCOUNTER — Encounter: Payer: Self-pay | Admitting: Family Medicine

## 2019-12-10 ENCOUNTER — Other Ambulatory Visit: Payer: Self-pay

## 2019-12-10 ENCOUNTER — Ambulatory Visit (INDEPENDENT_AMBULATORY_CARE_PROVIDER_SITE_OTHER): Payer: Medicare Other | Admitting: Family Medicine

## 2019-12-10 VITALS — Temp 97.5°F | Ht 62.0 in | Wt 130.0 lb

## 2019-12-10 DIAGNOSIS — R197 Diarrhea, unspecified: Secondary | ICD-10-CM

## 2019-12-12 NOTE — Progress Notes (Signed)
Patient cancelled visit

## 2019-12-13 ENCOUNTER — Encounter: Payer: Self-pay | Admitting: Family Medicine

## 2019-12-13 ENCOUNTER — Ambulatory Visit (INDEPENDENT_AMBULATORY_CARE_PROVIDER_SITE_OTHER): Payer: Medicare Other | Admitting: Obstetrics & Gynecology

## 2019-12-13 ENCOUNTER — Encounter: Payer: Self-pay | Admitting: Obstetrics & Gynecology

## 2019-12-13 ENCOUNTER — Other Ambulatory Visit: Payer: Self-pay

## 2019-12-13 VITALS — Ht 60.0 in | Wt 133.0 lb

## 2019-12-13 DIAGNOSIS — N993 Prolapse of vaginal vault after hysterectomy: Secondary | ICD-10-CM | POA: Diagnosis not present

## 2019-12-13 DIAGNOSIS — N952 Postmenopausal atrophic vaginitis: Secondary | ICD-10-CM | POA: Diagnosis not present

## 2019-12-13 DIAGNOSIS — N8111 Cystocele, midline: Secondary | ICD-10-CM

## 2019-12-13 NOTE — Progress Notes (Signed)
  HPI:      [name] is a 74 y.o. [id number] who presents today for her pessary follow up and examination related to her pelvic floor weakening.  Pt reports tolerating the pessary well with no vaginal bleeding (except scant amount today) and no vaginal discharge.  She reports episodic diarrhea, feels it is due to pessary.  Symptoms of pelvic floor weakening have greatly improved. She is voiding and defecating without difficulty. She currently has a Gehrung #2 pessary.  PMHx: She  has a past medical history of Hypertension and Thyroid disease. Also,  has a past surgical history that includes Bunionectomy (Left, 2006); Tubal ligation (1969); Image guided sinus surgery (N/A, 02/24/2018); Maxillary antrostomy (Left, 02/24/2018); Ethmoidectomy (Left, 02/24/2018); Frontal sinus exploration (Left, 02/24/2018); Sphenoidectomy (Left, 02/24/2018); and Abdominal hysterectomy (1980)., family history includes Cancer in her mother; Celiac disease in her mother; Hypertension in her father; Lymphoma in her father.,  reports that she quit smoking about 26 years ago. She has never used smokeless tobacco. She reports that she does not drink alcohol or use drugs.  She has a current medication list which includes the following prescription(s): biotin, vitamin d, ibuprofen, levothyroxine, lutein, multivitamin, potassium, turmeric, and zinc gluconate. Also, is allergic to other.  Review of Systems  All other systems reviewed and are negative.   Objective: Ht 5' (1.524 m)   Wt 133 lb (60.3 kg)   BMI 25.97 kg/m  Physical Exam Constitutional:      General: She is not in acute distress.    Appearance: She is well-developed.  Genitourinary:     Pelvic exam was performed with patient supine.     Vagina normal.     No vaginal erythema or bleeding.     Genitourinary Comments: Cuff intact/ no lesions Pelvic floor weakening noted Absent uterus and cervix  HENT:     Head: Normocephalic and atraumatic.     Nose: Nose normal.    Abdominal:     General: There is no distension.     Palpations: Abdomen is soft.     Tenderness: There is no abdominal tenderness.  Musculoskeletal:        General: Normal range of motion.  Neurological:     Mental Status: She is alert and oriented to person, place, and time.     Cranial Nerves: No cranial nerve deficit.  Skin:    General: Skin is warm and dry.  Psychiatric:        Attention and Perception: Attention normal.        Mood and Affect: Mood normal.        Speech: Speech normal.        Behavior: Behavior normal.        Cognition and Memory: Cognition normal.        Judgment: Judgment normal.     Pessary Care Pessary removed and cleaned.  Vagina checked - without erosions - pessary replaced.  A/P:   ICD-10-CM   1. Cystocele, midline  N81.11   2. Prolapse of vaginal vault after hysterectomy  N99.3   3. Vaginal atrophy  N95.2    Pessary was cleaned and replaced today. Instructions given for care. Concerning symptoms to observe for are counseled to patient. Follow up scheduled for 3 months.  A total of 20 minutes were spent face-to-face with the patient as well as preparation, review, communication, and documentation during this encounter.   Barnett Applebaum, MD, [facility], [facility] 12/13/2019  11:07 AM

## 2019-12-15 ENCOUNTER — Telehealth: Payer: Self-pay

## 2019-12-15 NOTE — Telephone Encounter (Signed)
Pt calling to let JP/PH know that within 24 hours after pessary taken out, cleaned and reinserted the diarrhea was 98% stopped.  Just wanted to let you know.

## 2019-12-27 DIAGNOSIS — E039 Hypothyroidism, unspecified: Secondary | ICD-10-CM | POA: Diagnosis not present

## 2019-12-27 DIAGNOSIS — I10 Essential (primary) hypertension: Secondary | ICD-10-CM | POA: Diagnosis not present

## 2019-12-27 LAB — BASIC METABOLIC PANEL WITH GFR
BUN: 9 mg/dL (ref 7–25)
CO2: 31 mmol/L (ref 20–32)
Calcium: 9 mg/dL (ref 8.6–10.4)
Chloride: 100 mmol/L (ref 98–110)
Creat: 0.63 mg/dL (ref 0.60–0.93)
GFR, Est African American: 102 mL/min/{1.73_m2} (ref >=60)
GFR, Est Non African American: 88 mL/min/{1.73_m2} (ref >=60)
Glucose, Bld: 105 mg/dL — ABNORMAL HIGH (ref 65–99)
Potassium: 3.5 mmol/L (ref 3.5–5.3)
Sodium: 142 mmol/L (ref 135–146)

## 2019-12-27 LAB — THYROID PANEL WITH TSH
Free Thyroxine Index: 2.9 (ref 1.4–3.8)
T3 Uptake: 27 % (ref 22–35)
T4, Total: 10.9 ug/dL (ref 5.1–11.9)
TSH: 2.92 mIU/L (ref 0.40–4.50)

## 2020-03-06 ENCOUNTER — Other Ambulatory Visit: Payer: Self-pay | Admitting: Family Medicine

## 2020-03-06 DIAGNOSIS — E039 Hypothyroidism, unspecified: Secondary | ICD-10-CM

## 2020-03-06 MED ORDER — LEVOTHYROXINE SODIUM 100 MCG PO TABS: 100.0000 ug | ORAL_TABLET | Freq: Every day | ORAL | 0 refills | Status: DC

## 2020-03-06 NOTE — Telephone Encounter (Signed)
Pt has appt 06/10 with Katha Cabal. Pt needs levothyroxine. [facility] [place]

## 2020-03-06 NOTE — Telephone Encounter (Signed)
Requested Prescriptions  Pending Prescriptions Disp Refills  . levothyroxine (SYNTHROID) 100 MCG tablet 90 tablet 1    Sig: Take 1 tablet (100 mcg total) by mouth daily.     Endocrinology:  Hypothyroid Agents Failed - 03/06/2020  9:26 AM      Failed - TSH needs to be rechecked within 3 months after an abnormal result. Refill until TSH is due.      Failed - Valid encounter within last 12 months    Recent Outpatient Visits          2 months ago Diarrhea, unspecified type   [facility] Steele Sizer, MD   5 months ago Essential hypertension, benign   [place], [place], FNP   1 year ago Essential hypertension, benign   [place], Satira Anis, MD   1 year ago Spondylolisthesis at L4-L5 level   [place], Satira Anis, MD   2 years ago Nasal and sinus discharge   [place], [place], FNP      Future Appointments            In 3 weeks Delsa Grana, PA-C [place] - TSH in normal range and within 360 days    TSH  Date Value Ref Range Status  12/27/2019 2.92 0.40 - 4.50 mIU/L Final

## 2020-03-08 ENCOUNTER — Other Ambulatory Visit: Payer: Self-pay

## 2020-03-08 ENCOUNTER — Telehealth: Payer: Self-pay | Admitting: Family Medicine

## 2020-03-08 DIAGNOSIS — E039 Hypothyroidism, unspecified: Secondary | ICD-10-CM

## 2020-03-08 MED ORDER — LEVOTHYROXINE SODIUM 100 MCG PO TABS: 100.0000 ug | ORAL_TABLET | Freq: Every day | ORAL | 0 refills | Status: DC

## 2020-03-08 NOTE — Chronic Care Management (AMB) (Signed)
  Chronic Care Management   Note  03/08/2020 Name: Mccartney Brucks MRN: [id number] DOB: 01/21/1946  Deitra Craine is a 74 y.o. year old female who is a primary care patient of Lada, Satira Anis, MD. I reached out to [name] by phone today in response to a referral sent by Ms. [employer]ule Dry health plan.     Ms. Goodell was given information about Chronic Care Management services today including:  1. CCM service includes personalized support from designated clinical staff supervised by her physician, including individualized plan of care and coordination with other care providers 2. 24/7 contact phone numbers for assistance for urgent and routine care needs. 3. Service will only be billed when office clinical staff spend 20 minutes or more in a month to coordinate care. 4. Only one practitioner may furnish and bill the service in a calendar month. 5. The patient may stop CCM services at any time (effective at the end of the month) by phone call to the office staff. 6. The patient will be responsible for cost sharing (co-pay) of up to 20% of the service fee (after annual deductible is met).  Patient did not agree to enrollment in care management services and does not wish to consider at this time.  Follow up plan: The patient has been provided with contact information for the care management team and has been advised to call with any health related questions or concerns.   Noreene Larsson, [place], [place], [place] [address] Direct Dial: [phone] [email or web address]: [email or web address]

## 2020-03-10 ENCOUNTER — Other Ambulatory Visit: Payer: Self-pay | Admitting: Emergency Medicine

## 2020-03-10 DIAGNOSIS — E039 Hypothyroidism, unspecified: Secondary | ICD-10-CM

## 2020-03-10 MED ORDER — LEVOTHYROXINE SODIUM 100 MCG PO TABS: 100.0000 ug | ORAL_TABLET | Freq: Every day | ORAL | 0 refills | Status: DC

## 2020-03-13 ENCOUNTER — Ambulatory Visit: Payer: Medicare Other | Admitting: Obstetrics & Gynecology

## 2020-03-17 ENCOUNTER — Encounter: Payer: Self-pay | Admitting: Obstetrics & Gynecology

## 2020-03-17 ENCOUNTER — Other Ambulatory Visit: Payer: Self-pay

## 2020-03-17 ENCOUNTER — Ambulatory Visit (INDEPENDENT_AMBULATORY_CARE_PROVIDER_SITE_OTHER): Payer: Medicare Other | Admitting: Obstetrics & Gynecology

## 2020-03-17 VITALS — Ht 62.0 in | Wt 132.0 lb

## 2020-03-17 DIAGNOSIS — N8111 Cystocele, midline: Secondary | ICD-10-CM | POA: Diagnosis not present

## 2020-03-17 DIAGNOSIS — N952 Postmenopausal atrophic vaginitis: Secondary | ICD-10-CM | POA: Diagnosis not present

## 2020-03-17 DIAGNOSIS — N993 Prolapse of vaginal vault after hysterectomy: Secondary | ICD-10-CM

## 2020-03-17 NOTE — Progress Notes (Signed)
HPI:      [name] is a 74 y.o. [id number] who presents today for her pessary follow up and examination related to her pelvic floor weakening.  Pt reports tolerating the pessary well with  no vaginal bleeding and some vaginal discharge at the end of the 3 month cycle.  Symptoms of pelvic floor weakening have greatly improved. She is voiding and defecating without difficulty. She currently has a Gehrung #2 pessary.  PMHx: She  has a past medical history of Hypertension and Thyroid disease. Also,  has a past surgical history that includes Bunionectomy (Left, 2006); Tubal ligation (1969); Image guided sinus surgery (N/A, 02/24/2018); Maxillary antrostomy (Left, 02/24/2018); Ethmoidectomy (Left, 02/24/2018); Frontal sinus exploration (Left, 02/24/2018); Sphenoidectomy (Left, 02/24/2018); and Abdominal hysterectomy (1980)., family history includes Cancer in her mother; Celiac disease in her mother; Hypertension in her father; Lymphoma in her father.,  reports that she quit smoking about 26 years ago. She has never used smokeless tobacco. She reports that she does not drink alcohol or use drugs.  She has a current medication list which includes the following prescription(s): biotin, vitamin d, ibuprofen, levothyroxine, lutein, multivitamin, potassium, turmeric, and zinc gluconate. Also, is allergic to other.  Review of Systems  Constitutional: Negative for chills, fever and malaise/fatigue.  HENT: Negative for congestion, sinus pain and sore throat.   Eyes: Negative for blurred vision and pain.  Respiratory: Negative for cough and wheezing.   Cardiovascular: Negative for chest pain and leg swelling.  Gastrointestinal: Negative for abdominal pain, constipation, diarrhea, heartburn, nausea and vomiting.  Genitourinary: Negative for dysuria, frequency, hematuria and urgency.  Musculoskeletal: Negative for back pain, joint pain, myalgias and neck pain.  Skin: Negative for itching and rash.  Neurological:  Negative for dizziness, tremors and weakness.  Endo/Heme/Allergies: Does not bruise/bleed easily.  Psychiatric/Behavioral: Negative for depression. The patient is not nervous/anxious and does not have insomnia.   All other systems reviewed and are negative.   Objective: Ht 5\' 2"  (1.575 m)   Wt 132 lb (59.9 kg)   BMI 24.14 kg/m  Physical Exam Constitutional:      General: She is not in acute distress.    Appearance: She is well-developed.  Genitourinary:     Pelvic exam was performed with patient supine.     Vagina normal.     No vaginal erythema or bleeding.     Genitourinary Comments: Cuff intact/ small vaginal abrasive surface on right Pelvic floor weakening present Absent uterus and cervix  HENT:     Head: Normocephalic and atraumatic.     Nose: Nose normal.  Abdominal:     General: There is no distension.     Palpations: Abdomen is soft.     Tenderness: There is no abdominal tenderness.  Musculoskeletal:        General: Normal range of motion.  Neurological:     Mental Status: She is alert and oriented to person, place, and time.     Cranial Nerves: No cranial nerve deficit.  Skin:    General: Skin is warm and dry.  Psychiatric:        Attention and Perception: Attention normal.        Mood and Affect: Mood normal.        Speech: Speech normal.        Behavior: Behavior normal.        Cognition and Memory: Cognition normal.        Judgment: Judgment normal.     Pessary Care  Pessary removed and cleaned.  Vagina checked - without erosions - pessary replaced.  A/P:   ICD-10-CM   1. Prolapse of vaginal vault after hysterectomy  N99.3   2. Cystocele, midline  N81.11   3. Vaginal atrophy  N95.2    Pessary was cleaned and replaced today. Instructions given for care. Concerning symptoms to observe for are counseled to patient. Follow up scheduled for 3 months.  A total of 20 minutes were spent face-to-face with the patient as well as preparation, review,  communication, and documentation during this encounter.   Barnett Applebaum, MD, [facility], [facility] 03/17/2020  11:56 AM

## 2020-03-30 ENCOUNTER — Encounter: Payer: Self-pay | Admitting: Family Medicine

## 2020-03-30 ENCOUNTER — Other Ambulatory Visit: Payer: Self-pay

## 2020-03-30 ENCOUNTER — Ambulatory Visit (INDEPENDENT_AMBULATORY_CARE_PROVIDER_SITE_OTHER): Payer: Medicare Other | Admitting: Family Medicine

## 2020-03-30 VITALS — BP 162/90 | HR 98 | Temp 98.3°F | Resp 14 | Ht 62.0 in | Wt 133.5 lb

## 2020-03-30 DIAGNOSIS — E039 Hypothyroidism, unspecified: Secondary | ICD-10-CM

## 2020-03-30 MED ORDER — THYROID 113.75 MG PO TABS: 1.0000 | ORAL_TABLET | Freq: Every day | ORAL | 1 refills | Status: DC

## 2020-03-30 NOTE — Patient Instructions (Signed)
I will need to look up the meds and dosing  Please let us know what the labs are that you feel you want and I will order the recheck labs of what I feel is medically appropriate.    Monitor your BP at home and we would want to make sure when relaxed at home that your blood pressure is <140/90.

## 2020-03-30 NOTE — Progress Notes (Signed)
Name: Anayelli Lai   MRN: [id number]    DOB: 04-Oct-1946   Date:03/30/2020       Progress Note  Chief Complaint  Patient presents with  . Follow-up  . Hypothyroidism     Subjective:   Annette Stevens is a 74 y.o. female, presents to clinic for routine follow up on the conditions listed above.  Hypothyroidism: She is wanting to switch over to a nature thyroid  - 113.75 mg  Endocrinologist "so called in the area" dropped her dose and she lost hair is tired, hands and feet are cold. She has been "doing this for so long that the last doctor wanted to hire her" she asks how old I am over and over again, and refuses to answer any questions to obtain history today.  Lab Results  Component Value Date   TSH 2.92 12/27/2019   T4TOTAL 10.9 12/27/2019     Patient Active Problem List   Diagnosis Date Noted  . Lung nodules 12/17/2018  . Spondylolysis 11/18/2018  . Sciatica of left side 11/18/2018  . Chronic right shoulder pain 11/18/2018  . Hallux valgus of right foot 11/18/2018  . Spondylolisthesis at L4-L5 level 11/18/2018  . Spondylolisthesis 12/10/2017  . Osteoarthritis of knee 12/10/2017  . Degenerative joint disease of shoulder region 12/10/2017  . Grieving 12/10/2017  . Lipoma of left lower extremity 12/10/2017  . Sebaceous cyst 12/10/2017  . Cystocele, midline 04/15/2017  . 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 Essential hypertension, benign 09/13/2016  . Hypothyroidism 09/13/2016  . Vaginal atrophy 09/29/2012    Past Surgical History:  Procedure Laterality Date  . ABDOMINAL HYSTERECTOMY  1980   partial still has ovaries  . BUNIONECTOMY Left 2006  . ETHMOIDECTOMY Left 02/24/2018   Procedure: ETHMOIDECTOMY;  Surgeon: Clyde Canterbury, MD;  Location: [facility];  Service: ENT;  Laterality: Left;  . FRONTAL SINUS EXPLORATION Left 02/24/2018   Procedure: FRONTAL SINUS EXPLORATION;  Surgeon: Clyde Canterbury, MD;  Location: [facility];  Service:  ENT;  Laterality: Left;  . IMAGE GUIDED SINUS SURGERY N/A 02/24/2018   Procedure: IMAGE GUIDED SINUS SURGERY;  Surgeon: Clyde Canterbury, MD;  Location: [facility];  Service: ENT;  Laterality: N/A;  GAVE DISK BRENDA 3-21 KP  . MAXILLARY ANTROSTOMY Left 02/24/2018   Procedure: ENDSOCPIC MAXILLARY ANTROSTOMY TOTAL;  Surgeon: Clyde Canterbury, MD;  Location: [facility];  Service: ENT;  Laterality: Left;  . SPHENOIDECTOMY Left 02/24/2018   Procedure: SPHENOIDECTOMY;  Surgeon: Clyde Canterbury, MD;  Location: [facility];  Service: ENT;  Laterality: Left;  . TUBAL LIGATION  1969    Family History  Problem Relation Age of Onset  . Dementia Mother   . Cancer Mother        colon  . Lymphoma Father   . Hypertension Father     Social History   Tobacco Use  . Smoking status: Former Smoker    Quit date: 1995    Years since quitting: 26.4  . Smokeless tobacco: Never Used  Vaping Use  . Vaping Use: Never used  Substance Use Topics  . Alcohol use: No  . Drug use: No      Current Outpatient Medications:  .  Biotin 10000 MCG TABS, Take 1 tablet by mouth daily. JUST on four days of the week, Disp: 30 tablet, Rfl:  .  Cholecalciferol (VITAMIN D) 50 MCG (2000 UT) CAPS, Take 1 capsule (2,000 Units total) by mouth daily., Disp: , Rfl:  .  ibuprofen (ADVIL,MOTRIN) 200 MG tablet, Take 200 mg by mouth every 6 (six) hours as needed., Disp: , Rfl:  .  levothyroxine (SYNTHROID) 100 MCG tablet, Take 1 tablet (100 mcg total) by mouth daily., Disp: 30 tablet, Rfl: 0 .  Lutein 10 MG TABS, HerbaVision Lutein with bilberry, one by mouth daily, Disp: , Rfl:  .  Potassium 99 MG TABS, Take 1 tablet by mouth daily., Disp: , Rfl:  .  Turmeric 500 MG CAPS, Take 1 tablet by mouth 2 (two) times daily., Disp: , Rfl:   Allergies  Allergen Reactions  . Other Other (See Comments)    Blood pressure medications    Chart Review Today: I personally reviewed active problem list, medication list, allergies,  family history, social history, health maintenance, notes from last encounter, lab results, imaging with the patient/caregiver today.   Review of Systems  10 Systems reviewed and are negative for acute change except as noted in the HPI.  Objective:    Vitals:   03/30/20 1104  BP: (!) 162/90  Pulse: 98  Resp: 14  Temp: 98.3 F (36.8 C)  SpO2: 96%  Weight: 133 lb 8 oz (60.6 kg)  Height: 5\' 2"  (1.575 m)    Body mass index is 24.42 kg/m.  Physical Exam Vitals and nursing note reviewed.  Constitutional:      General: She is not in acute distress.    Appearance: Normal appearance. She is not ill-appearing, toxic-appearing or diaphoretic.  HENT:     Head: Normocephalic and atraumatic.  Cardiovascular:     Rate and Rhythm: Normal rate and regular rhythm.  Musculoskeletal:        General: No swelling.     Right lower leg: No edema.     Left lower leg: No edema.  Skin:    General: Skin is warm and dry.     Coloration: Skin is not jaundiced or pale.  Neurological:     Mental Status: She is alert.  Psychiatric:        Mood and Affect: Affect is angry and inappropriate.        Behavior: Behavior is uncooperative and aggressive.        PHQ2/9: Depression screen [facility] 2/9 03/30/2020 12/31/2018 11/18/2018 12/10/2017 09/03/2016  Decreased Interest 0 0 - 0 0  Down, Depressed, Hopeless 3 0 0 0 0  PHQ - 2 Score 3 0 0 0 0  Altered sleeping 0 - - - -  Tired, decreased energy 0 - - - -  Change in appetite 0 - - - -  Feeling bad or failure about yourself  0 - - - -  Trouble concentrating 0 - - - -  Moving slowly or fidgety/restless 0 - - - -  Suicidal thoughts 0 - - - -  PHQ-9 Score 3 - - - -  Difficult doing work/chores Not difficult at all - - - -    phq 9 is neg, reviewed  Fall Risk: Fall Risk  03/30/2020 09/09/2019 12/31/2018 11/18/2018 12/10/2017  Falls in the past year? 1 0 0 0 No  Number falls in past yr: 1 0 0 0 -  Injury with Fall? 0 0 0 0 -  Risk for fall due to : - -  - - Impaired balance/gait  Follow up - - Falls prevention discussed - -    Functional Status Survey: Is the patient deaf or have difficulty hearing?: No Does the patient have difficulty seeing, even when wearing glasses/contacts?: No Does the  patient have difficulty concentrating, remembering, or making decisions?: No Does the patient have difficulty walking or climbing stairs?: No Does the patient have difficulty dressing or bathing?: No Does the patient have difficulty doing errands alone such as visiting a doctor's office or shopping?: No   Assessment & Plan:   1. Hypothyroidism, unspecified type Pt is very uncooperative today, new to me, she is adamant that she wants a specific kind of thyroid medication, I told her I would not prescribe anything unless I had looked up and verified medications and it is appropriate for prescribing. I did look up the requested medications it looks like it may be discontinued, looks like a combo medication and the comparable dosing is less than what she is taking now.  I attempted to explain this to her.  She said labs don't tell me anything and she knows her body.  She is very upset that her medication dose was decreased or in the past that it was decreased to rapidly going from 125 mcg to 100 -she stated she needed smaller dose adjustments in the past and endocrinologist in the area do not know what they are doing and all doctors do not with her doing, but she knows because she has done her research.    I tried to explain that med likely not available and I would be happy to send in slight dose increase we could monitor her lab work feel that there is some flexibility with dose adjustments and taking the patient's symptoms into account when assessing lab work etc.  Patient demanded the prescription be printed so was given to her today.  Likely too low of a dose or unavailable.      No follow-ups on file.   Delsa Grana, PA-C 03/30/20 11:32 AM

## 2020-03-31 ENCOUNTER — Telehealth: Payer: Self-pay

## 2020-03-31 NOTE — Telephone Encounter (Signed)
Pt states that med is discontinued so she will do the levothyroxine 168mcg sent to [facility]

## 2020-04-03 MED ORDER — LEVOTHYROXINE SODIUM 112 MCG PO TABS: 112.0000 ug | ORAL_TABLET | Freq: Every day | ORAL | 0 refills | Status: DC

## 2020-04-03 MED ORDER — LEVOTHYROXINE SODIUM 125 MCG PO TABS: 125.0000 ug | ORAL_TABLET | Freq: Every day | ORAL | 3 refills | Status: DC

## 2020-04-04 NOTE — Telephone Encounter (Signed)
Pt.notified

## 2020-04-20 DIAGNOSIS — Z23 Encounter for immunization: Secondary | ICD-10-CM | POA: Diagnosis not present

## 2020-05-02 ENCOUNTER — Encounter: Payer: Self-pay | Admitting: Family Medicine

## 2020-05-11 DIAGNOSIS — Z23 Encounter for immunization: Secondary | ICD-10-CM | POA: Diagnosis not present

## 2020-05-23 ENCOUNTER — Telehealth: Payer: Self-pay | Admitting: Family Medicine

## 2020-05-23 NOTE — Telephone Encounter (Signed)
Copied from [place] [id number]. Topic: Medicare AWV >> May 23, 2020  9:21 AM Cher Nakai R wrote: Reason for CRM:   Left message for patient to call back and schedule the Medicare Annual Wellness Visit (AWV) in office or virtual  Last AWV 12/31/2018  Please schedule at anytime with [place].  40 minute appointment  Any questions, please contact me at [phone]

## 2020-05-30 ENCOUNTER — Telehealth: Payer: Self-pay

## 2020-05-30 NOTE — Telephone Encounter (Signed)
Patient has apt 06/19/2020 for pessary f/u. She reports her pessary isn't holding her bladder. She's inquiring if we can go ahead and order the next size up of the same type pessary she has so that it will be able to be placed at her visit. Cb#[id number].

## 2020-05-30 NOTE — Telephone Encounter (Signed)
She currently has a Development worker, community #2. Would you like me to order her a Gehrung #3?

## 2020-06-02 NOTE — Telephone Encounter (Signed)
Gehrung#3 in stock. Labeled for patient in cabinet at [employer] [employer].

## 2020-06-19 ENCOUNTER — Ambulatory Visit (INDEPENDENT_AMBULATORY_CARE_PROVIDER_SITE_OTHER): Payer: Medicare Other | Admitting: Obstetrics & Gynecology

## 2020-06-19 ENCOUNTER — Other Ambulatory Visit: Payer: Self-pay

## 2020-06-19 ENCOUNTER — Encounter: Payer: Self-pay | Admitting: Obstetrics & Gynecology

## 2020-06-19 VITALS — Ht 62.0 in | Wt 132.0 lb

## 2020-06-19 DIAGNOSIS — N952 Postmenopausal atrophic vaginitis: Secondary | ICD-10-CM | POA: Diagnosis not present

## 2020-06-19 DIAGNOSIS — N993 Prolapse of vaginal vault after hysterectomy: Secondary | ICD-10-CM

## 2020-06-19 DIAGNOSIS — N8111 Cystocele, midline: Secondary | ICD-10-CM

## 2020-06-19 NOTE — Progress Notes (Signed)
HPI:      [name] is a 74 y.o. [id number] who presents today for her pessary follow up and examination related to her pelvic floor weakening.  Pt reports tolerating the pessary well with no vaginal bleeding and no vaginal discharge.  Symptoms of pelvic floor weakening have greatly improved although she does feel some bladder prolapse and difficulty w void at times.. She is voiding and defecating without difficulty. She currently has a Gehrung #2 pessary.  PMHx: She  has a past medical history of Hypertension and Thyroid disease. Also,  has a past surgical history that includes Bunionectomy (Left, 2006); Tubal ligation (1969); Image guided sinus surgery (N/A, 02/24/2018); Maxillary antrostomy (Left, 02/24/2018); Ethmoidectomy (Left, 02/24/2018); Frontal sinus exploration (Left, 02/24/2018); Sphenoidectomy (Left, 02/24/2018); and Abdominal hysterectomy (1980)., family history includes Cancer in her mother; Dementia in her mother; Hypertension in her father; Lymphoma in her father.,  reports that she quit smoking about 26 years ago. She has never used smokeless tobacco. She reports that she does not drink alcohol and does not use drugs.  She has a current medication list which includes the following prescription(s): biotin, vitamin d, ibuprofen, levothyroxine, lutein, potassium, turmeric, and levothyroxine. Also, is allergic to other.  Review of Systems  All other systems reviewed and are negative.   Objective: Ht 5\' 2"  (1.575 m)   Wt 132 lb (59.9 kg)   BMI 24.14 kg/m  Physical Exam Constitutional:      General: She is not in acute distress.    Appearance: She is well-developed.  Genitourinary:     Pelvic exam was performed with patient supine.     Vagina normal.     No vaginal erythema or bleeding.     Genitourinary Comments: Cuff intact/ small vaginal abrasive surface Pelvic floor weakening present Absent uterus and cervix   HENT:     Head: Normocephalic and atraumatic.     Nose: Nose  normal.  Abdominal:     General: There is no distension.     Palpations: Abdomen is soft.     Tenderness: There is no abdominal tenderness.  Musculoskeletal:        General: Normal range of motion.  Neurological:     Mental Status: She is alert and oriented to person, place, and time.     Cranial Nerves: No cranial nerve deficit.  Skin:    General: Skin is warm and dry.  Psychiatric:        Attention and Perception: Attention normal.        Mood and Affect: Mood normal.        Speech: Speech normal.        Behavior: Behavior normal.        Cognition and Memory: Cognition normal.        Judgment: Judgment normal.     Pessary Care Pessary removed and cleaned.  Vagina checked - without erosions - pessary replaced.  A/P:   ICD-10-CM   1. Prolapse of vaginal vault after hysterectomy  N99.3   2. Cystocele, midline  N81.11   3. Vaginal atrophy  N95.2    Pessary was cleaned and replaced with larger Gehrung #3 today. Instructions given for care. Concerning symptoms to observe for are counseled to patient. Follow up scheduled for 3 months.  A total of 20 minutes were spent face-to-face with the patient as well as preparation, review, communication, and documentation during this encounter.   Barnett Applebaum, MD, [facility], [facility] 06/19/2020  11:06 AM

## 2020-06-21 ENCOUNTER — Telehealth: Payer: Self-pay | Admitting: Family Medicine

## 2020-06-21 NOTE — Telephone Encounter (Signed)
Copied from [place] [id number]. Topic: Medicare AWV >> Jun 21, 2020  2:17 PM Cher Nakai R wrote: Reason for CRM:  Left message for patient to call back and schedule the Medicare Annual Wellness Visit (AWV) in office or virtual  Last AWV 12/31/2018  Please schedule at anytime with [place].  40 minute appointment  Any questions, please contact me at [phone]

## 2020-08-09 ENCOUNTER — Telehealth: Payer: Self-pay | Admitting: Family Medicine

## 2020-08-09 NOTE — Telephone Encounter (Signed)
Copied from [place] [id number]. Topic: Medicare AWV >> Aug 09, 2020  2:12 PM Cher Nakai R wrote: Reason for CRM:  Left message for patient to call back and schedule the Medicare Annual Wellness Visit (AWV) in office or virtual  Last AWV 12/31/2018  Please schedule at anytime with [place].  40 minute appointment  Any questions, please contact me at [phone]

## 2020-09-19 ENCOUNTER — Encounter: Payer: Self-pay | Admitting: Obstetrics & Gynecology

## 2020-09-19 ENCOUNTER — Other Ambulatory Visit: Payer: Self-pay

## 2020-09-19 ENCOUNTER — Ambulatory Visit (INDEPENDENT_AMBULATORY_CARE_PROVIDER_SITE_OTHER): Payer: Medicare Other | Admitting: Obstetrics & Gynecology

## 2020-09-19 VITALS — Ht 62.0 in | Wt 133.0 lb

## 2020-09-19 DIAGNOSIS — N952 Postmenopausal atrophic vaginitis: Secondary | ICD-10-CM

## 2020-09-19 DIAGNOSIS — N8111 Cystocele, midline: Secondary | ICD-10-CM | POA: Diagnosis not present

## 2020-09-19 DIAGNOSIS — N993 Prolapse of vaginal vault after hysterectomy: Secondary | ICD-10-CM | POA: Diagnosis not present

## 2020-09-19 NOTE — Progress Notes (Signed)
  HPI:      [name] is a 74 y.o. [id number] who presents today for her pessary follow up and examination related to her pelvic floor weakening.  Pt reports tolerating the pessary well with no vaginal bleeding and no vaginal discharge.  Symptoms of pelvic floor weakening have greatly improved. She is voiding and defecating without difficulty. She currently has a Gehrung #3 pessary (changed from #2 prior visit).  Still having some sx's of feeling prolapse around pessary.  No expulsion  PMHx: She  has a past medical history of Hypertension and Thyroid disease. Also,  has a past surgical history that includes Bunionectomy (Left, 2006); Tubal ligation (1969); Image guided sinus surgery (N/A, 02/24/2018); Maxillary antrostomy (Left, 02/24/2018); Ethmoidectomy (Left, 02/24/2018); Frontal sinus exploration (Left, 02/24/2018); Sphenoidectomy (Left, 02/24/2018); and Abdominal hysterectomy (1980)., family history includes Cancer in her mother; Dementia in her mother; Hypertension in her father; Lymphoma in her father.,  reports that she quit smoking about 26 years ago. She has never used smokeless tobacco. She reports that she does not drink alcohol and does not use drugs.  She has a current medication list which includes the following prescription(s): biotin, vitamin d, ibuprofen, levothyroxine, levothyroxine, lutein, potassium, and turmeric. Also, is allergic to other.  Review of Systems  All other systems reviewed and are negative.   Objective: Ht 5\' 2"  (1.575 m)   Wt 133 lb (60.3 kg)   BMI 24.33 kg/m  Physical Exam Constitutional:      General: She is not in acute distress.    Appearance: She is well-developed.  Genitourinary:     Pelvic exam was performed with patient supine.     Vagina normal.     No vaginal erythema or bleeding.     Genitourinary Comments: Cuff intact/ no lesions Vaginal vault prolapse mild - moderate Absent uterus and cervix  HENT:     Head: Normocephalic and atraumatic.      Nose: Nose normal.  Abdominal:     General: There is no distension.     Palpations: Abdomen is soft.     Tenderness: There is no abdominal tenderness.  Musculoskeletal:        General: Normal range of motion.  Neurological:     Mental Status: She is alert and oriented to person, place, and time.     Cranial Nerves: No cranial nerve deficit.  Skin:    General: Skin is warm and dry.  Psychiatric:        Attention and Perception: Attention normal.        Mood and Affect: Mood normal.        Speech: Speech normal.        Behavior: Behavior normal.        Cognition and Memory: Cognition normal.        Judgment: Judgment normal.     Pessary Care Pessary removed and cleaned.  Vagina checked - without erosions - pessary replaced.  A/P:1. Prolapse of vaginal vault after hysterectomy 2. Cystocele, midline 3. Vaginal atrophy Pessary was cleaned and replaced w Size #4 to better aid sx's today. Instructions given for care. Concerning symptoms to observe for are counseled to patient. Follow up scheduled for 3 months.  A total of 20 minutes were spent face-to-face with the patient as well as preparation, review, communication, and documentation during this encounter.   Barnett Applebaum, MD, [facility], [facility] 09/19/2020  10:45 AM

## 2020-09-20 ENCOUNTER — Ambulatory Visit (INDEPENDENT_AMBULATORY_CARE_PROVIDER_SITE_OTHER): Payer: Medicare Other | Admitting: Obstetrics & Gynecology

## 2020-09-20 ENCOUNTER — Encounter: Payer: Self-pay | Admitting: Obstetrics & Gynecology

## 2020-09-20 VITALS — Ht 63.0 in | Wt 133.0 lb

## 2020-09-20 DIAGNOSIS — N8111 Cystocele, midline: Secondary | ICD-10-CM | POA: Diagnosis not present

## 2020-09-20 NOTE — Progress Notes (Signed)
  HPI:      [name] is a 74 y.o. [id number] who presents today for pessary expulsion after placement of Gehrung #3 pessary yesterday, an increase in size from the prior pessary.  No strenuous activities or straining.  PMHx: She  has a past medical history of Hypertension and Thyroid disease. Also,  has a past surgical history that includes Bunionectomy (Left, 2006); Tubal ligation (1969); Image guided sinus surgery (N/A, 02/24/2018); Maxillary antrostomy (Left, 02/24/2018); Ethmoidectomy (Left, 02/24/2018); Frontal sinus exploration (Left, 02/24/2018); Sphenoidectomy (Left, 02/24/2018); and Abdominal hysterectomy (1980)., family history includes Cancer in her mother; Dementia in her mother; Hypertension in her father; Lymphoma in her father.,  reports that she quit smoking about 26 years ago. She has never used smokeless tobacco. She reports that she does not drink alcohol and does not use drugs.   She has a current medication list which includes the following prescription(s): biotin, vitamin d, ibuprofen, levothyroxine, levothyroxine, lutein, potassium, and turmeric. Also, is allergic to other.  Review of Systems  All other systems reviewed and are negative.   Objective: Ht 5\' 3"  (1.6 m)   Wt 133 lb (60.3 kg)   BMI 23.56 kg/m  Physical Exam Genitourinary:     Genitourinary Comments: Normal vag tissues Cystocele Gr 4 without pessary    Vagina checked - without erosions - pessary replaced.  A/P: 1. Cystocele, midline Pessary was cleaned and replaced today.    Will try Gehrung #3 again as size appropriate fitting on exam today Discussed option for Anterior Colporrahphy if continued expulsions of pessaries occurs  Instructions given for care. Concerning symptoms to observe for are counseled to patient. Follow up scheduled for 3 months.  A total of 13 minutes were spent face-to-face with the patient as well as preparation, review, communication, and documentation during this encounter.    Barnett Applebaum, MD, [facility], [facility] 09/20/2020  1:44 PM

## 2020-10-04 ENCOUNTER — Telehealth: Payer: Self-pay | Admitting: Family Medicine

## 2020-10-04 NOTE — Telephone Encounter (Signed)
Copied from [place] [id number]. Topic: Medicare AWV >> Oct 04, 2020 10:53 AM Cher Nakai R wrote: Reason for CRM:  Left message for patient to call back and schedule Medicare Annual Wellness Visit (AWV) in office.   If not able to come in office, please offer to do virtually.   Last AWV 12/31/2018  Please schedule at anytime with [place].  40 minute appointment  Any questions, please contact me at [phone]

## 2020-10-05 DIAGNOSIS — Z23 Encounter for immunization: Secondary | ICD-10-CM | POA: Diagnosis not present

## 2020-12-18 ENCOUNTER — Ambulatory Visit (INDEPENDENT_AMBULATORY_CARE_PROVIDER_SITE_OTHER): Payer: Medicare Other | Admitting: Obstetrics & Gynecology

## 2020-12-18 ENCOUNTER — Other Ambulatory Visit: Payer: Self-pay

## 2020-12-18 ENCOUNTER — Encounter: Payer: Self-pay | Admitting: Obstetrics & Gynecology

## 2020-12-18 VITALS — Ht 63.0 in | Wt 133.0 lb

## 2020-12-18 DIAGNOSIS — N993 Prolapse of vaginal vault after hysterectomy: Secondary | ICD-10-CM | POA: Diagnosis not present

## 2020-12-18 DIAGNOSIS — N8111 Cystocele, midline: Secondary | ICD-10-CM

## 2020-12-18 DIAGNOSIS — N952 Postmenopausal atrophic vaginitis: Secondary | ICD-10-CM | POA: Diagnosis not present

## 2020-12-18 NOTE — Progress Notes (Signed)
HPI:      [name] is a 75 y.o. [id number] who presents today for her pessary follow up and examination related to her pelvic floor weakening.  Pt reports tolerating the pessary well with no vaginal bleeding and no vaginal discharge.  Symptoms of pelvic floor weakening have greatly improved. She is voiding and defecating without difficulty. She currently has a Gehrung #3 pessary.  No problems w expulsion  PMHx: She  has a past medical history of Hypertension and Thyroid disease. Also,  has a past surgical history that includes Bunionectomy (Left, 2006); Tubal ligation (1969); Image guided sinus surgery (N/A, 02/24/2018); Maxillary antrostomy (Left, 02/24/2018); Ethmoidectomy (Left, 02/24/2018); Frontal sinus exploration (Left, 02/24/2018); Sphenoidectomy (Left, 02/24/2018); and Abdominal hysterectomy (1980)., family history includes Cancer in her mother; Dementia in her mother; Hypertension in her father; Lymphoma in her father.,  reports that she quit smoking about 27 years ago. She has never used smokeless tobacco. She reports that she does not drink alcohol and does not use drugs.  She has a current medication list which includes the following prescription(s): biotin, vitamin d, ibuprofen, levothyroxine, lutein, potassium, turmeric, and levothyroxine. Also, is allergic to other.  Review of Systems  All other systems reviewed and are negative.   Objective: Ht 5\' 3"  (1.6 m)   Wt 133 lb (60.3 kg)   BMI 23.56 kg/m  Physical Exam Constitutional:      General: She is not in acute distress.    Appearance: She is well-developed.  Genitourinary:     Bladder, vagina and urethral meatus normal.     Genitourinary Comments: Cuff intact/ no lesions  Absent uterus and cervix     No vaginal erythema or bleeding.     Anterior vaginal prolapse present.    Moderate vaginal atrophy present.    Cervix is absent.     Uterus is absent.     Bladder exam comments: Cystocele gr 3.     Pelvic exam was performed  with patient in the lithotomy position.  HENT:     Head: Normocephalic and atraumatic.     Nose: Nose normal.  Abdominal:     General: There is no distension.     Palpations: Abdomen is soft.     Tenderness: There is no abdominal tenderness.  Musculoskeletal:        General: Normal range of motion.  Neurological:     Mental Status: She is alert and oriented to person, place, and time.     Cranial Nerves: No cranial nerve deficit.  Skin:    General: Skin is warm and dry.  Psychiatric:        Attention and Perception: Attention normal.        Mood and Affect: Mood normal.        Speech: Speech normal.        Behavior: Behavior normal.        Cognition and Memory: Cognition normal.        Judgment: Judgment normal.     Pessary Care Pessary removed and cleaned.  Vagina checked - without erosions - pessary replaced.  A/P:   ICD-10-CM   1. Cystocele, midline  N81.11   2. Prolapse of vaginal vault after hysterectomy  N99.3   3. Vaginal atrophy  N95.2    Pessary was cleaned and replaced today. Instructions given for care. Concerning symptoms to observe for are counseled to patient. Follow up scheduled for 3 months.  A total of 20 minutes were spent face-to-face with  the patient as well as preparation, review, communication, and documentation during this encounter.   Barnett Applebaum, MD, [facility], [facility] 12/18/2020  11:19 AM

## 2021-03-15 ENCOUNTER — Other Ambulatory Visit: Payer: Self-pay

## 2021-03-15 ENCOUNTER — Ambulatory Visit (INDEPENDENT_AMBULATORY_CARE_PROVIDER_SITE_OTHER): Payer: Medicare Other | Admitting: Obstetrics & Gynecology

## 2021-03-15 ENCOUNTER — Encounter: Payer: Self-pay | Admitting: Obstetrics & Gynecology

## 2021-03-15 VITALS — Ht 62.0 in | Wt 133.0 lb

## 2021-03-15 DIAGNOSIS — N993 Prolapse of vaginal vault after hysterectomy: Secondary | ICD-10-CM

## 2021-03-15 DIAGNOSIS — N952 Postmenopausal atrophic vaginitis: Secondary | ICD-10-CM | POA: Diagnosis not present

## 2021-03-15 DIAGNOSIS — N8111 Cystocele, midline: Secondary | ICD-10-CM | POA: Diagnosis not present

## 2021-03-15 NOTE — Progress Notes (Signed)
HPI:      [name] is a 75 y.o. [id number] who presents today for her pessary follow up and examination related to her pelvic floor weakening.  Pt reports tolerating the pessary well with occas light vaginal bleeding and no vaginal discharge.  Symptoms of pelvic floor weakening have greatly improved. She is voiding and defecating without difficulty. She currently has a Gehrung #3 pessary.  PMHx: She  has a past medical history of Hypertension and Thyroid disease. Also,  has a past surgical history that includes Bunionectomy (Left, 2006); Tubal ligation (1969); Image guided sinus surgery (N/A, 02/24/2018); Maxillary antrostomy (Left, 02/24/2018); Ethmoidectomy (Left, 02/24/2018); Frontal sinus exploration (Left, 02/24/2018); Sphenoidectomy (Left, 02/24/2018); and Abdominal hysterectomy (1980)., family history includes Cancer in her mother; Dementia in her mother; Hypertension in her father; Lymphoma in her father.,  reports that she quit smoking about 27 years ago. She has never used smokeless tobacco. She reports that she does not drink alcohol and does not use drugs.  She has a current medication list which includes the following prescription(s): vitamin d, ibuprofen, levothyroxine, potassium, turmeric, biotin, levothyroxine, and lutein. Also, is allergic to other.  Review of Systems  All other systems reviewed and are negative.   Objective: Ht 5\' 2"  (1.575 m)   Wt 133 lb (60.3 kg)   BMI 24.33 kg/m  Physical Exam Constitutional:      General: She is not in acute distress.    Appearance: She is well-developed.  Genitourinary:     Genitourinary Comments: Cuff intact/ no lesions  Absent uterus and cervix     Vaginal bleeding present.     No vaginal erythema.     Anterior, posterior and apical vaginal prolapse present.    Moderate vaginal atrophy present.    Vaginal exam comments: Some areas of bleeding noted but no erosions or ulcerations.     Cervix is absent.     Uterus is absent.      Pelvic exam was performed with patient in the lithotomy position.  HENT:     Head: Normocephalic and atraumatic.     Nose: Nose normal.  Abdominal:     General: There is no distension.     Palpations: Abdomen is soft.     Tenderness: There is no abdominal tenderness.  Musculoskeletal:        General: Normal range of motion.  Neurological:     Mental Status: She is alert and oriented to person, place, and time.     Cranial Nerves: No cranial nerve deficit.  Skin:    General: Skin is warm and dry.  Psychiatric:        Attention and Perception: Attention normal.        Mood and Affect: Mood normal.        Speech: Speech normal.        Behavior: Behavior normal.        Cognition and Memory: Cognition normal.        Judgment: Judgment normal.     Pessary Care Pessary removed and cleaned.  Vagina checked - without erosions - pessary replaced.  A/P: 1. Prolapse of vaginal vault after hysterectomy 2. Cystocele, midline 3. Vaginal atrophy Pessary was cleaned and replaced today. Instructions given for care. Concerning symptoms to observe for are counseled to patient. Follow up scheduled for 3 months. Pessary holiday considered if bleeding is more frequent.   Consideration for urogyn referral if surgery is desired  A total of 20 minutes were spent face-to-face  with the patient as well as preparation, review, communication, and documentation during this encounter.   Barnett Applebaum, MD, [facility], [facility] 03/15/2021  11:27 AM

## 2021-04-08 ENCOUNTER — Other Ambulatory Visit: Payer: Self-pay | Admitting: Family Medicine

## 2021-04-09 ENCOUNTER — Other Ambulatory Visit: Payer: Self-pay

## 2021-04-09 NOTE — Telephone Encounter (Signed)
Patient does not take anymore

## 2021-04-11 ENCOUNTER — Other Ambulatory Visit: Payer: Self-pay | Admitting: Family Medicine

## 2021-04-11 NOTE — Telephone Encounter (Signed)
   Notes to clinic:  patient has 2 pills left and states she has been on this medication for 46 years and would like request expedited. Patient schduled appointment on 04/17/2021. Patient also states she can not just come off medication or she will experience healh problems   Requested Prescriptions  Pending Prescriptions Disp Refills   EUTHYROX 125 MCG tablet [Pharmacy Med Name: Euthyrox 125 MCG Oral Tablet] 90 tablet 0    Sig: Take 1 tablet by mouth once daily      Endocrinology:  Hypothyroid Agents Failed - 04/11/2021  9:19 AM      Failed - TSH needs to be rechecked within 3 months after an abnormal result. Refill until TSH is due.      Failed - TSH in normal range and within 360 days    TSH  Date Value Ref Range Status  12/27/2019 2.92 0.40 - 4.50 mIU/L Final          Failed - Valid encounter within last 12 months    Recent Outpatient Visits           1 year ago Hypothyroidism, unspecified type   [facility] Delsa Grana, PA-C   1 year ago Diarrhea, unspecified type   [facility] Steele Sizer, MD   1 year ago Essential hypertension, benign   [place], FNP   2 years ago Essential hypertension, benign   [place], Satira Anis, MD   2 years ago Spondylolisthesis at L4-L5 level   [place], Satira Anis, MD       Future Appointments             In 6 days [place], Jake [facility], [facility], [facility]

## 2021-04-11 NOTE — Telephone Encounter (Signed)
Medication Refill - Medication: levothyroxine (SYNTHROID) 125 MCG tablet  patient has 2 pills left and states she has been on this medication for 46 years and would like request expedited. Patient schduled appointment on 04/17/2021. Patient also states she can not just come off medication or she will experience healh problems   Has the patient contacted their pharmacy? Yes.    (Agent: If yes, when and what did the pharmacy advise?) Pharmacy sent in 2 refills request  Preferred Pharmacy (with phone number or street name):   [place], [place] - [place] Phone:  [phone]  Fax:  [phone]      Agent: Please be advised that RX refills may take up to 3 business days. We ask that you follow-up with your pharmacy.

## 2021-04-11 NOTE — Telephone Encounter (Signed)
Pt has an appt on Tuesday 04/17/21

## 2021-04-17 ENCOUNTER — Ambulatory Visit
Admission: RE | Admit: 2021-04-17 | Discharge: 2021-04-17 | Disposition: A | Discharge status: Home / Self Care | Payer: Medicare Other | Attending: Family Medicine | Admitting: Family Medicine

## 2021-04-17 ENCOUNTER — Other Ambulatory Visit: Payer: Self-pay

## 2021-04-17 ENCOUNTER — Ambulatory Visit (INDEPENDENT_AMBULATORY_CARE_PROVIDER_SITE_OTHER): Payer: Medicare Other | Admitting: Family Medicine

## 2021-04-17 ENCOUNTER — Ambulatory Visit
Admission: RE | Admit: 2021-04-17 | Discharge: 2021-04-17 | Disposition: A | Discharge status: Home / Self Care | Payer: Medicare Other | Source: Ambulatory Visit | Attending: Family Medicine | Admitting: Family Medicine

## 2021-04-17 ENCOUNTER — Encounter: Payer: Self-pay | Admitting: Family Medicine

## 2021-04-17 VITALS — BP 160/98 | HR 96 | Temp 98.5°F | Resp 16 | Ht 62.0 in | Wt 130.8 lb

## 2021-04-17 DIAGNOSIS — M25461 Effusion, right knee: Secondary | ICD-10-CM | POA: Diagnosis not present

## 2021-04-17 DIAGNOSIS — M25562 Pain in left knee: Secondary | ICD-10-CM | POA: Diagnosis not present

## 2021-04-17 DIAGNOSIS — M79604 Pain in right leg: Secondary | ICD-10-CM

## 2021-04-17 DIAGNOSIS — M79605 Pain in left leg: Secondary | ICD-10-CM | POA: Diagnosis not present

## 2021-04-17 DIAGNOSIS — M25561 Pain in right knee: Secondary | ICD-10-CM

## 2021-04-17 DIAGNOSIS — E039 Hypothyroidism, unspecified: Secondary | ICD-10-CM

## 2021-04-17 DIAGNOSIS — G8929 Other chronic pain: Secondary | ICD-10-CM

## 2021-04-17 DIAGNOSIS — M1712 Unilateral primary osteoarthritis, left knee: Secondary | ICD-10-CM | POA: Diagnosis not present

## 2021-04-17 DIAGNOSIS — R5383 Other fatigue: Secondary | ICD-10-CM | POA: Diagnosis not present

## 2021-04-17 DIAGNOSIS — I10 Essential (primary) hypertension: Secondary | ICD-10-CM | POA: Diagnosis not present

## 2021-04-17 DIAGNOSIS — M1732 Unilateral post-traumatic osteoarthritis, left knee: Secondary | ICD-10-CM

## 2021-04-17 DIAGNOSIS — M6281 Muscle weakness (generalized): Secondary | ICD-10-CM | POA: Diagnosis not present

## 2021-04-17 NOTE — Assessment & Plan Note (Signed)
Recheck TSH today. Possibly contributing to constellation of symptoms.

## 2021-04-17 NOTE — Assessment & Plan Note (Addendum)
Contributing to pain. Obtain standing XR today. Continue NSAIDs, OTC rub creams. Can consider steroid injections if refractory to conservative management.

## 2021-04-17 NOTE — Progress Notes (Signed)
    SUBJECTIVE:   CHIEF COMPLAINT / HPI:   Hypothyroidism - Medications: Euthyrox 196mcg - Current symptoms:   lost hair, cold hands and feet , difficulty sleeping, brittle nails - Symptoms have progressed to a point and plateaued  FATIGUE, Generalized weakness Duration:   years Context when symptoms started:   thyroid medication adjustment Symptoms improve with rest: no  Insomnia: yes hard to fall asleep and stay asleep Snoring: no Observed apnea by bed partner: sleeps alone History of sleep study: no Dysnea on exertion:  no Orthopnea/PND: no Chest pain: no Chronic cough: no Lower extremity edema:  sometimes small amount Arthralgias:yes Myalgias: yes Weakness:  generalized  Rash: no  Knee Pain - about 2 years - the more she moves the more she hurts - also with L hip pain, calf and thigh pain - comes and goes - some swelling of knees - tried muscle rub, ice, ibuprofen. sometimes works - no trauma Weakness with weight bearing or walking: no Sensation of giving way: yes Locking: no Popping: yes Bruising: no Swelling: yes Redness: no Paresthesias/decreased sensation: no Fevers: no   OBJECTIVE:   BP (!) 160/98   Pulse 96   Temp 98.5 F (36.9 C)   Resp 16   Ht 5\' 2"  (1.575 m)   Wt 130 lb 12.8 oz (59.3 kg)   SpO2 99%   BMI 23.92 kg/m   Gen: in NAD Card: RRR Lungs: CTAB MSK: Knee: no joint laxity. Slight swelling noted to medial R knee. No TTP at medial/lateral joint lines or with patellar compression. Negative thessalys. Unable to tolerate FADIR/FABER of R leg, negative on L. Significant TTP to L thigh muscle belly.  ASSESSMENT/PLAN:   Essential hypertension, benign Likely elevated 2/2 pain. Recommend home measurements, will recheck on f/u.  Hypothyroidism Recheck TSH today. Possibly contributing to constellation of symptoms.  Osteoarthritis of knee Contributing to pain. Obtain standing XR today. Continue NSAIDs, OTC rub creams. Can consider steroid  injections if refractory to conservative management.  Muscle weakness Generalized but mostly noted in thigh with associated pain and fatigue. Possible 2/2 from favoring arthritic knee. Will obtain autoimmune labs and CK to rule out autoimmune process or wasting. Could benefit from PT for strengthening once knee pain controlled.    F/u in 2 mths.  Myles Gip, DO

## 2021-04-17 NOTE — Patient Instructions (Signed)
It was great to see you!  Our plans for today:  - Get the xrays done at the Outpatient imaging center. You do not need an appointment for this. - We are checking some labs today, we will release these results to your MyChart. - come back in about 2 months for follow up.  Take care and seek immediate care sooner if you develop any concerns.   Dr. Ky Barban

## 2021-04-17 NOTE — Assessment & Plan Note (Signed)
Generalized but mostly noted in thigh with associated pain and fatigue. Possible 2/2 from favoring arthritic knee. Will obtain autoimmune labs and CK to rule out autoimmune process or wasting. Could benefit from PT for strengthening once knee pain controlled.

## 2021-04-17 NOTE — Assessment & Plan Note (Signed)
Likely elevated 2/2 pain. Recommend home measurements, will recheck on f/u.

## 2021-04-20 LAB — BASIC METABOLIC PANEL
BUN: 13 mg/dL (ref 7–25)
CO2: 27 mmol/L (ref 20–32)
Calcium: 9.2 mg/dL (ref 8.6–10.4)
Chloride: 104 mmol/L (ref 98–110)
Creat: 0.69 mg/dL (ref 0.60–0.93)
Glucose, Bld: 115 mg/dL (ref 65–139)
Potassium: 4.3 mmol/L (ref 3.5–5.3)
Sodium: 143 mmol/L (ref 135–146)

## 2021-04-20 LAB — ANTI-NUCLEAR AB-TITER (ANA TITER)
ANA TITER: 1:320 {titer} — ABNORMAL HIGH
ANA Titer 1: 1:320 {titer} — ABNORMAL HIGH

## 2021-04-20 LAB — RHEUMATOID FACTOR: Rheumatoid fact SerPl-aCnc: <14 IU/mL (ref <14)

## 2021-04-20 LAB — TSH: TSH: 0.81 mIU/L (ref 0.40–4.50)

## 2021-04-20 LAB — ANA: Anti Nuclear Antibody (ANA): POSITIVE — AB

## 2021-04-20 LAB — CK: Total CK: 44 U/L (ref 29–143)

## 2021-04-24 ENCOUNTER — Other Ambulatory Visit: Payer: Self-pay | Admitting: Family Medicine

## 2021-04-24 DIAGNOSIS — M5432 Sciatica, left side: Secondary | ICD-10-CM

## 2021-04-24 DIAGNOSIS — M1732 Unilateral post-traumatic osteoarthritis, left knee: Secondary | ICD-10-CM

## 2021-04-24 MED ORDER — LEVOTHYROXINE SODIUM 125 MCG PO TABS: 125.0000 ug | ORAL_TABLET | Freq: Every day | ORAL | 3 refills | Status: DC

## 2021-05-11 DIAGNOSIS — R2689 Other abnormalities of gait and mobility: Secondary | ICD-10-CM | POA: Diagnosis not present

## 2021-05-11 DIAGNOSIS — R262 Difficulty in walking, not elsewhere classified: Secondary | ICD-10-CM | POA: Diagnosis not present

## 2021-05-11 DIAGNOSIS — M25561 Pain in right knee: Secondary | ICD-10-CM | POA: Diagnosis not present

## 2021-05-11 DIAGNOSIS — M25562 Pain in left knee: Secondary | ICD-10-CM | POA: Diagnosis not present

## 2021-05-23 DIAGNOSIS — M25561 Pain in right knee: Secondary | ICD-10-CM | POA: Diagnosis not present

## 2021-05-23 DIAGNOSIS — R2689 Other abnormalities of gait and mobility: Secondary | ICD-10-CM | POA: Diagnosis not present

## 2021-05-23 DIAGNOSIS — M25562 Pain in left knee: Secondary | ICD-10-CM | POA: Diagnosis not present

## 2021-05-23 DIAGNOSIS — R262 Difficulty in walking, not elsewhere classified: Secondary | ICD-10-CM | POA: Diagnosis not present

## 2021-06-05 ENCOUNTER — Telehealth: Payer: Self-pay | Admitting: Family Medicine

## 2021-06-05 NOTE — Telephone Encounter (Signed)
Opened in Error.

## 2021-06-05 NOTE — Telephone Encounter (Signed)
Copied from [place] [id number]. Topic: Medicare AWV >> Jun 05, 2021 11:57 AM Cher Nakai R wrote: Reason for CRM:  Left message for patient to call back and schedule Medicare Annual Wellness Visit (AWV) in office.   If unable to come into the office for AWV,  please offer to do virtually or by telephone.  Last AWV: 12/31/2018  Please schedule at anytime with [facility].  40 minute appointment  Any questions, please contact me at [phone]

## 2021-06-15 ENCOUNTER — Ambulatory Visit: Payer: Medicare Other | Admitting: Obstetrics & Gynecology

## 2021-06-18 ENCOUNTER — Other Ambulatory Visit: Payer: Self-pay

## 2021-06-18 ENCOUNTER — Encounter: Payer: Self-pay | Admitting: Obstetrics & Gynecology

## 2021-06-18 ENCOUNTER — Ambulatory Visit (INDEPENDENT_AMBULATORY_CARE_PROVIDER_SITE_OTHER): Payer: Medicare Other | Admitting: Obstetrics & Gynecology

## 2021-06-18 VITALS — Ht 62.0 in | Wt 125.0 lb

## 2021-06-18 DIAGNOSIS — N8111 Cystocele, midline: Secondary | ICD-10-CM | POA: Diagnosis not present

## 2021-06-18 DIAGNOSIS — N952 Postmenopausal atrophic vaginitis: Secondary | ICD-10-CM

## 2021-06-18 DIAGNOSIS — N993 Prolapse of vaginal vault after hysterectomy: Secondary | ICD-10-CM

## 2021-06-18 NOTE — Progress Notes (Signed)
HPI:      Annette Stevens is a 75 y.o. [id number] who presents today for her pessary follow up and examination related to her pelvic floor weakening.  Pt reports tolerating the pessary well with no vaginal bleeding and no vaginal discharge.  Symptoms of pelvic floor weakening have greatly improved. She is voiding and defecating without difficulty. She currently has a Gehrung#3 pessary.  She has done better w pessary now that she is doing PT for leg problems (helping both).  PMHx: She  has a past medical history of Hypertension and Thyroid disease. Also,  has a past surgical history that includes Bunionectomy (Left, 2006); Tubal ligation (1969); Image guided sinus surgery (N/A, 02/24/2018); Maxillary antrostomy (Left, 02/24/2018); Ethmoidectomy (Left, 02/24/2018); Frontal sinus exploration (Left, 02/24/2018); Sphenoidectomy (Left, 02/24/2018); and Abdominal hysterectomy (1980)., family history includes Cancer in her mother; Dementia in her mother; Hypertension in her father; Lymphoma in her father.,  reports that she quit smoking about 27 years ago. Her smoking use included cigarettes. She has never used smokeless tobacco. She reports that she does not drink alcohol and does not use drugs.  She has a current medication list which includes the following prescription(s): biotin, vitamin d, ibuprofen, levothyroxine, lutein, potassium, and turmeric. Also, is allergic to other.  Review of Systems  All other systems reviewed and are negative.  Objective: Ht '5\' 2"'$  (1.575 m)   Wt 125 lb (56.7 kg)   BMI 22.86 kg/m  Physical Exam Constitutional:      General: She is not in acute distress.    Appearance: She is well-developed.  Genitourinary:     Bladder normal.     Genitourinary Comments: Cuff intact/ no lesions  Absent uterus and cervix     No vaginal erythema or bleeding.     Anterior vaginal prolapse present.    Moderate vaginal atrophy present.    Cervix is absent.  HENT:     Head: Normocephalic and  atraumatic.     Nose: Nose normal.  Abdominal:     General: There is no distension.     Palpations: Abdomen is soft.     Tenderness: There is no abdominal tenderness.  Musculoskeletal:        General: Normal range of motion.  Neurological:     Mental Status: She is alert and oriented to person, place, and time.     Cranial Nerves: No cranial nerve deficit.  Skin:    General: Skin is warm and dry.  Psychiatric:        Attention and Perception: Attention normal.        Mood and Affect: Mood normal.        Speech: Speech normal.        Behavior: Behavior normal.        Cognition and Memory: Cognition normal.        Judgment: Judgment normal.    Pessary Care Pessary removed and cleaned.  Vagina checked - without erosions - pessary replaced.  A/P:   ICD-10-CM   1. Prolapse of vaginal vault after hysterectomy  N99.3     2. Cystocele, midline  N81.11     3. Vaginal atrophy  N95.2      Pessary was cleaned and replaced today. Instructions given for care. Concerning symptoms to observe for are counseled to patient. Follow up scheduled for 3 months.  A total of 20 minutes were spent face-to-face with the patient as well as preparation, review, communication, and documentation during this encounter.  Barnett Applebaum, MD, [facility], [facility] 06/18/2021  10:38 AM

## 2021-06-19 ENCOUNTER — Encounter: Payer: Self-pay | Admitting: Family Medicine

## 2021-06-19 ENCOUNTER — Ambulatory Visit (INDEPENDENT_AMBULATORY_CARE_PROVIDER_SITE_OTHER): Payer: Medicare Other | Admitting: Family Medicine

## 2021-06-19 ENCOUNTER — Ambulatory Visit: Payer: Medicare Other | Admitting: Family Medicine

## 2021-06-19 ENCOUNTER — Telehealth: Payer: Self-pay

## 2021-06-19 VITALS — BP 172/102 | HR 119 | Temp 98.3°F | Ht 62.0 in | Wt 125.1 lb

## 2021-06-19 DIAGNOSIS — M6281 Muscle weakness (generalized): Secondary | ICD-10-CM

## 2021-06-19 DIAGNOSIS — M1732 Unilateral post-traumatic osteoarthritis, left knee: Secondary | ICD-10-CM

## 2021-06-19 DIAGNOSIS — I10 Essential (primary) hypertension: Secondary | ICD-10-CM | POA: Diagnosis not present

## 2021-06-19 NOTE — Assessment & Plan Note (Signed)
Pain manageable with OTC pain relief. With h/o arthritis, fatigue, weakness and positive ANA, offered rheum referral however patient declines.

## 2021-06-19 NOTE — Assessment & Plan Note (Addendum)
Elevated on initial check, refused recheck. Knee pain likely contributing as notes normal measurements when not in pain. Previously intolerant to antihypertensives, does not want prescription antihypertensive. Recommend obtaining home BP cuff to monitor and f/u in a few months for recheck and labs. Counseled on DASH diet.

## 2021-06-19 NOTE — Telephone Encounter (Signed)
Pt calling for JP; had some bleeding after visit yesterday; hasn't really stopped; is quite a bit; is there anything she needs to do?  Is it okay for her to have her therapy session in the morning?  Wants a call back from [place].  [phone]

## 2021-06-19 NOTE — Telephone Encounter (Signed)
Pt states the bleeding has almost stopped.

## 2021-06-19 NOTE — Patient Instructions (Addendum)
It was great to see you!  Our plans for today:  - Get a blood pressure cuff and monitor your blood pressure at home, especially when you're not in pain.  - Let us know if you change your mind about a rheumatology referral.  - Keep working with physical therapy.  - Come back in a few months for your blood pressure.   Take care and seek immediate care sooner if you develop any concerns.   Dr. Ky Barban  [email or web address]">   DASH Eating Plan DASH stands for Dietary Approaches to Stop Hypertension. The DASH eating plan is a healthy eating plan that has been shown to: Reduce high blood pressure (hypertension). Reduce your risk for type 2 diabetes, heart disease, and stroke. Help with weight loss. What are tips for following this plan? Reading food labels Check food labels for the amount of salt (sodium) per serving. Choose foods with less than 5 percent of the Daily Value of sodium. Generally, foods with less than 300 milligrams (mg) of sodium per serving fit into this eating plan. To find whole grains, look for the word "whole" as the first word in the ingredient list. Shopping Buy products labeled as "low-sodium" or "no salt added." Buy fresh foods. Avoid canned foods and pre-made or frozen meals. Cooking Avoid adding salt when cooking. Use salt-free seasonings or herbs instead of table salt or sea salt. Check with your health care provider or pharmacist before using salt substitutes. Do not fry foods. Cook foods using healthy methods such as baking, boiling, grilling, roasting, and broiling instead. Cook with heart-healthy oils, such as olive, canola, avocado, soybean, or sunflower oil. Meal planning  Eat a balanced diet that includes: 4 or more servings of fruits and 4 or more servings of vegetables each day. Try to fill one-half of your plate with fruits and vegetables. 6-8 servings of whole grains each day. Less than 6 oz (170 g) of  lean meat, poultry, or fish each day. A 3-oz (85-g) serving of meat is about the same size as a deck of cards. One egg equals 1 oz (28 g). 2-3 servings of low-fat dairy each day. One serving is 1 cup (237 mL). 1 serving of nuts, seeds, or beans 5 times each week. 2-3 servings of heart-healthy fats. Healthy fats called omega-3 fatty acids are found in foods such as walnuts, flaxseeds, fortified milks, and eggs. These fats are also found in cold-water fish, such as sardines, salmon, and mackerel. Limit how much you eat of: Canned or prepackaged foods. Food that is high in trans fat, such as some fried foods. Food that is high in saturated fat, such as fatty meat. Desserts and other sweets, sugary drinks, and other foods with added sugar. Full-fat dairy products. Do not salt foods before eating. Do not eat more than 4 egg yolks a week. Try to eat at least 2 vegetarian meals a week. Eat more home-cooked food and less restaurant, buffet, and fast food.  Lifestyle When eating at a restaurant, ask that your food be prepared with less salt or no salt, if possible. If you drink alcohol: Limit how much you use to: 0-1 drink a day for women who are not pregnant. 0-2 drinks a day for men. Be aware of how much alcohol is in your drink. In the [place] one drink equals one 12 oz bottle of beer (355 mL), one 5 oz glass of wine (148 mL), or one 1 oz glass of hard liquor (44 mL). General information Avoid  eating more than 2,300 mg of salt a day. If you have hypertension, you may need to reduce your sodium intake to 1,500 mg a day. Work with your health care provider to maintain a healthy body weight or to lose weight. Ask what an ideal weight is for you. Get at least 30 minutes of exercise that causes your heart to beat faster (aerobic exercise) most days of the week. Activities may include walking, swimming, or biking. Work with your health care provider or dietitian to adjust your eating plan to your  individual calorie needs. What foods should I eat? Fruits All fresh, dried, or frozen fruit. Canned fruit in natural juice (without addedsugar). Vegetables Fresh or frozen vegetables (raw, steamed, roasted, or grilled). Low-sodium or reduced-sodium tomato and vegetable juice. Low-sodium or reduced-sodium tomatosauce and tomato paste. Low-sodium or reduced-sodium canned vegetables. Grains Whole-grain or whole-wheat bread. Whole-grain or whole-wheat pasta. Brown rice. [place]. Bulgur. Whole-grain and low-sodium cereals. Pita bread.Low-fat, low-sodium crackers. Whole-wheat flour tortillas. Meats and other proteins Skinless chicken or [place]. Ground chicken or [place]. Pork with fat trimmed off. Fish and seafood. Egg whites. Dried beans, peas, or lentils. Unsalted nuts, nut butters, and seeds. Unsalted canned beans. Lean cuts of beef with fat trimmed off. Low-sodium, lean precooked or cured meat, such as sausages or meatloaves. Dairy Low-fat (1%) or fat-free (skim) milk. Reduced-fat, low-fat, or fat-free cheeses. Nonfat, low-sodium ricotta or cottage cheese. Low-fat or nonfatyogurt. Low-fat, low-sodium cheese. Fats and oils Soft margarine without trans fats. Vegetable oil. Reduced-fat, low-fat, or light mayonnaise and salad dressings (reduced-sodium). Canola, safflower, olive, avocado, soybean, andsunflower oils. Avocado. Seasonings and condiments Herbs. Spices. Seasoning mixes without salt. Other foods Unsalted popcorn and pretzels. Fat-free sweets. The items listed above may not be a complete list of foods and beverages you can eat. Contact a dietitian for more information. What foods should I avoid? Fruits Canned fruit in a light or heavy syrup. Fried fruit. Fruit in cream or buttersauce. Vegetables Creamed or fried vegetables. Vegetables in a cheese sauce. Regular canned vegetables (not low-sodium or reduced-sodium). Regular canned tomato sauce and paste (not low-sodium or  reduced-sodium). Regular tomato and vegetable juice(not low-sodium or reduced-sodium). Angie Fava. Olives. Grains Baked goods made with fat, such as croissants, muffins, or some breads. Drypasta or rice meal packs. Meats and other proteins Fatty cuts of meat. Ribs. Fried meat. Berniece Salines. Bologna, salami, and other precooked or cured meats, such as sausages or meat loaves. Fat from the back of a pig (fatback). Bratwurst. Salted nuts and seeds. Canned beans with added salt. Canned orsmoked fish. Whole eggs or egg yolks. Chicken or [place] with skin. Dairy Whole or 2% milk, cream, and half-and-half. Whole or full-fat cream cheese. Whole-fat or sweetened yogurt. Full-fat cheese. Nondairy creamers. Whippedtoppings. Processed cheese and cheese spreads. Fats and oils Butter. Stick margarine. Lard. Shortening. Ghee. Bacon fat. Tropical oils, suchas coconut, palm kernel, or palm oil. Seasonings and condiments Onion salt, garlic salt, seasoned salt, table salt, and sea salt. Worcestershire sauce. Tartar sauce. Barbecue sauce. Teriyaki sauce. Soy sauce, including reduced-sodium. Steak sauce. Canned and packaged gravies. Fish sauce. Oyster sauce. Cocktail sauce. Store-bought horseradish. Ketchup. Mustard. Meat flavorings and tenderizers. Bouillon cubes. Hot sauces. Pre-made or packaged marinades. Pre-made or packaged taco seasonings. Relishes. Regular saladdressings. Other foods Salted popcorn and pretzels. The items listed above may not be a complete list of foods and beverages you should avoid. Contact a dietitian for more information. Where to find more information National Heart, Lung, and Blood Institute: https://wilson-eaton.com/  American Heart Association: www.heart.org Academy of Nutrition and Dietetics: www.eatright.[place]: www.kidney.org Summary The DASH eating plan is a healthy eating plan that has been shown to reduce high blood pressure (hypertension). It may also reduce your risk for  type 2 diabetes, heart disease, and stroke. When on the DASH eating plan, aim to eat more fresh fruits and vegetables, whole grains, lean proteins, low-fat dairy, and heart-healthy fats. With the DASH eating plan, you should limit salt (sodium) intake to 2,300 mg a day. If you have hypertension, you may need to reduce your sodium intake to 1,500 mg a day. Work with your health care provider or dietitian to adjust your eating plan to your individual calorie needs. This information is not intended to replace advice given to you by your health care provider. Make sure you discuss any questions you have with your healthcare provider. Document Revised: 09/10/2019 Document Reviewed: 09/10/2019 Elsevier Patient Education  2022 [employer].

## 2021-06-19 NOTE — Progress Notes (Signed)
    SUBJECTIVE:   CHIEF COMPLAINT / HPI:   KNEE PAIN - chronic - previously seen 6/28 for same. Standing knee XR b/l with tricompartmental arthritis. - still with some pain - seeing PT with some improvement. Weakness improved. - taking advil with relief with muscle rub creams.  Fatigue, generalized weakness - TSH, CBC wnl at last visit. ANA positive at last visit. CK, RF negative. Referred to PT at last visit with improvement in thigh weakness.   OBJECTIVE:   BP (!) 172/102   Pulse (!) 119   Temp 98.3 F (36.8 C)   Ht '5\' 2"'$  (1.575 m)   Wt 125 lb 1.6 oz (56.7 kg)   SpO2 98%   BMI 22.88 kg/m   Gen: well appearing, in NAD Lungs: Comfortable WOB on RA Ext: WWP, no edema   ASSESSMENT/PLAN:   Essential hypertension, benign Elevated on initial check, refused recheck. Knee pain likely contributing as notes normal measurements when not in pain. Previously intolerant to antihypertensives, does not want prescription antihypertensive. Recommend obtaining home BP cuff to monitor and f/u in a few months for recheck and labs. Counseled on DASH diet.  Muscle weakness Improved with PT, continue.  Osteoarthritis of knee Pain manageable with OTC pain relief. With h/o arthritis, fatigue, weakness and positive ANA, offered rheum referral however patient declines.      Myles Gip, DO

## 2021-06-19 NOTE — Assessment & Plan Note (Signed)
Improved with PT, continue.

## 2021-06-22 DIAGNOSIS — M25562 Pain in left knee: Secondary | ICD-10-CM | POA: Diagnosis not present

## 2021-06-22 DIAGNOSIS — R262 Difficulty in walking, not elsewhere classified: Secondary | ICD-10-CM | POA: Diagnosis not present

## 2021-06-22 DIAGNOSIS — M25561 Pain in right knee: Secondary | ICD-10-CM | POA: Diagnosis not present

## 2021-06-22 DIAGNOSIS — R2689 Other abnormalities of gait and mobility: Secondary | ICD-10-CM | POA: Diagnosis not present

## 2021-07-02 ENCOUNTER — Telehealth: Payer: Self-pay | Admitting: Family Medicine

## 2021-07-02 NOTE — Telephone Encounter (Signed)
Copied from [place] [id number]. Topic: Medicare AWV >> Jul 02, 2021 11:09 AM Cher Nakai R wrote: Reason for CRM:  Left message for patient to call back and schedule Medicare Annual Wellness Visit (AWV) in office.   If unable to come into the office for AWV,  please offer to do virtually or by telephone.  Last AWV: 12/31/2018  Please schedule at anytime with [place].  40 minute appointment  Any questions, please contact me at [phone]

## 2021-07-16 ENCOUNTER — Telehealth: Payer: Self-pay | Admitting: Family Medicine

## 2021-07-16 NOTE — Telephone Encounter (Signed)
Copied from [place] [id number]. Topic: Medicare AWV >> Jul 16, 2021 11:01 AM Cher Nakai R wrote: Reason for CRM:  Left message for patient to call back and schedule Medicare Annual Wellness Visit (AWV) in office.   If unable to come into the office for AWV,  please offer to do virtually or by telephone.  Last AWV: 12/31/2018  Please schedule at anytime with [place].  40 minute appointment  Any questions, please contact me at [phone]

## 2021-07-23 ENCOUNTER — Telehealth: Payer: Self-pay | Admitting: Family Medicine

## 2021-07-23 DIAGNOSIS — R2689 Other abnormalities of gait and mobility: Secondary | ICD-10-CM | POA: Diagnosis not present

## 2021-07-23 DIAGNOSIS — R262 Difficulty in walking, not elsewhere classified: Secondary | ICD-10-CM | POA: Diagnosis not present

## 2021-07-23 DIAGNOSIS — M25562 Pain in left knee: Secondary | ICD-10-CM | POA: Diagnosis not present

## 2021-07-23 DIAGNOSIS — M25561 Pain in right knee: Secondary | ICD-10-CM | POA: Diagnosis not present

## 2021-07-23 NOTE — Telephone Encounter (Signed)
Pt will be switching her levothyroxine (switching to an [place])   Mickel Baas, called from [facility] in [place] to notify office that they are having switch the manufacturers because the current brand is out of stock. Needs call back for approval from clinic.  Best contact: [phone]

## 2021-07-23 NOTE — Telephone Encounter (Signed)
Gave approval to Mickel Baas (pharmacist) for switching the brand.

## 2021-08-20 ENCOUNTER — Ambulatory Visit (INDEPENDENT_AMBULATORY_CARE_PROVIDER_SITE_OTHER): Payer: Medicare Other | Admitting: Family Medicine

## 2021-08-20 ENCOUNTER — Other Ambulatory Visit: Payer: Self-pay

## 2021-08-20 ENCOUNTER — Encounter: Payer: Self-pay | Admitting: Family Medicine

## 2021-08-20 VITALS — BP 152/100 | HR 98 | Temp 98.1°F | Resp 16 | Ht 62.0 in | Wt 125.2 lb

## 2021-08-20 DIAGNOSIS — I10 Essential (primary) hypertension: Secondary | ICD-10-CM

## 2021-08-20 DIAGNOSIS — M4316 Spondylolisthesis, lumbar region: Secondary | ICD-10-CM

## 2021-08-20 MED ORDER — BLOOD PRESSURE CUFF MISC: 1.0000 | Freq: Every day | 0 refills | Status: DC

## 2021-08-20 MED ORDER — CYCLOBENZAPRINE HCL 5 MG PO TABS: 5.0000 mg | ORAL_TABLET | Freq: Every day | ORAL | 0 refills | Status: DC | PRN

## 2021-08-20 NOTE — Assessment & Plan Note (Addendum)
Remains elevated, patient attributes to chronic pain but has not checked BP at home, doesn't have monitor. Rx sent for BP cuff.  Again recommended pharmacologic intervention and discussed risks of chronically elevated BP including cardiac and renal damage. Again declines treatment. Will obtain labs. Recommend DASH diet. F/u in one month with BP measurements. Continue PT exercises and work to control chronic pain.

## 2021-08-20 NOTE — Patient Instructions (Addendum)
It was great to see you!  Our plans for today:  - You can increase your advil up to 800mg  every 6 hours as needed.  - I sent a muscle relaxer to help with your pain. You would only take this as needed.   We are checking some labs today, we will release these results to your MyChart.  Take care and seek immediate care sooner if you develop any concerns.   Dr. Ky Barban  DASH Eating Plan DASH stands for Dietary Approaches to Stop Hypertension. The DASH eating plan is a healthy eating plan that has been shown to: Reduce high blood pressure (hypertension). Reduce your risk for type 2 diabetes, heart disease, and stroke. Help with weight loss. What are tips for following this plan? Reading food labels Check food labels for the amount of salt (sodium) per serving. Choose foods with less than 5 percent of the Daily Value of sodium. Generally, foods with less than 300 milligrams (mg) of sodium per serving fit into this eating plan. To find whole grains, look for the word "whole" as the first word in the ingredient list. Shopping Buy products labeled as "low-sodium" or "no salt added." Buy fresh foods. Avoid canned foods and pre-made or frozen meals. Cooking Avoid adding salt when cooking. Use salt-free seasonings or herbs instead of table salt or sea salt. Check with your health care provider or pharmacist before using salt substitutes. Do not fry foods. Cook foods using healthy methods such as baking, boiling, grilling, roasting, and broiling instead. Cook with heart-healthy oils, such as olive, canola, avocado, soybean, or sunflower oil. Meal planning  Eat a balanced diet that includes: 4 or more servings of fruits and 4 or more servings of vegetables each day. Try to fill one-half of your plate with fruits and vegetables. 6-8 servings of whole grains each day. Less than 6 oz (170 g) of lean meat, poultry, or fish each day. A 3-oz (85-g) serving of meat is about the same size as a deck of  cards. One egg equals 1 oz (28 g). 2-3 servings of low-fat dairy each day. One serving is 1 cup (237 mL). 1 serving of nuts, seeds, or beans 5 times each week. 2-3 servings of heart-healthy fats. Healthy fats called omega-3 fatty acids are found in foods such as walnuts, flaxseeds, fortified milks, and eggs. These fats are also found in cold-water fish, such as sardines, salmon, and mackerel. Limit how much you eat of: Canned or prepackaged foods. Food that is high in trans fat, such as some fried foods. Food that is high in saturated fat, such as fatty meat. Desserts and other sweets, sugary drinks, and other foods with added sugar. Full-fat dairy products. Do not salt foods before eating. Do not eat more than 4 egg yolks a week. Try to eat at least 2 vegetarian meals a week. Eat more home-cooked food and less restaurant, buffet, and fast food. Lifestyle When eating at a restaurant, ask that your food be prepared with less salt or no salt, if possible. If you drink alcohol: Limit how much you use to: 0-1 drink a day for women who are not pregnant. 0-2 drinks a day for men. Be aware of how much alcohol is in your drink. In the [place] one drink equals one 12 oz bottle of beer (355 mL), one 5 oz glass of wine (148 mL), or one 1 oz glass of hard liquor (44 mL). General information Avoid eating more than 2,300 mg of salt a  day. If you have hypertension, you may need to reduce your sodium intake to 1,500 mg a day. Work with your health care provider to maintain a healthy body weight or to lose weight. Ask what an ideal weight is for you. Get at least 30 minutes of exercise that causes your heart to beat faster (aerobic exercise) most days of the week. Activities may include walking, swimming, or biking. Work with your health care provider or dietitian to adjust your eating plan to your individual calorie needs. What foods should I eat? Fruits All fresh, dried, or frozen fruit. Canned fruit in  natural juice (without added sugar). Vegetables Fresh or frozen vegetables (raw, steamed, roasted, or grilled). Low-sodium or reduced-sodium tomato and vegetable juice. Low-sodium or reduced-sodium tomato sauce and tomato paste. Low-sodium or reduced-sodium canned vegetables. Grains Whole-grain or whole-wheat bread. Whole-grain or whole-wheat pasta. Brown rice. [place]. Bulgur. Whole-grain and low-sodium cereals. Pita bread. Low-fat, low-sodium crackers. Whole-wheat flour tortillas. Meats and other proteins Skinless chicken or [place]. Ground chicken or [place]. Pork with fat trimmed off. Fish and seafood. Egg whites. Dried beans, peas, or lentils. Unsalted nuts, nut butters, and seeds. Unsalted canned beans. Lean cuts of beef with fat trimmed off. Low-sodium, lean precooked or cured meat, such as sausages or meat loaves. Dairy Low-fat (1%) or fat-free (skim) milk. Reduced-fat, low-fat, or fat-free cheeses. Nonfat, low-sodium ricotta or cottage cheese. Low-fat or nonfat yogurt. Low-fat, low-sodium cheese. Fats and oils Soft margarine without trans fats. Vegetable oil. Reduced-fat, low-fat, or light mayonnaise and salad dressings (reduced-sodium). Canola, safflower, olive, avocado, soybean, and sunflower oils. Avocado. Seasonings and condiments Herbs. Spices. Seasoning mixes without salt. Other foods Unsalted popcorn and pretzels. Fat-free sweets. The items listed above may not be a complete list of foods and beverages you can eat. Contact a dietitian for more information. What foods should I avoid? Fruits Canned fruit in a light or heavy syrup. Fried fruit. Fruit in cream or butter sauce. Vegetables Creamed or fried vegetables. Vegetables in a cheese sauce. Regular canned vegetables (not low-sodium or reduced-sodium). Regular canned tomato sauce and paste (not low-sodium or reduced-sodium). Regular tomato and vegetable juice (not low-sodium or reduced-sodium). Angie Fava.  Olives. Grains Baked goods made with fat, such as croissants, muffins, or some breads. Dry pasta or rice meal packs. Meats and other proteins Fatty cuts of meat. Ribs. Fried meat. Berniece Salines. Bologna, salami, and other precooked or cured meats, such as sausages or meat loaves. Fat from the back of a pig (fatback). Bratwurst. Salted nuts and seeds. Canned beans with added salt. Canned or smoked fish. Whole eggs or egg yolks. Chicken or [place] with skin. Dairy Whole or 2% milk, cream, and half-and-half. Whole or full-fat cream cheese. Whole-fat or sweetened yogurt. Full-fat cheese. Nondairy creamers. Whipped toppings. Processed cheese and cheese spreads. Fats and oils Butter. Stick margarine. Lard. Shortening. Ghee. Bacon fat. Tropical oils, such as coconut, palm kernel, or palm oil. Seasonings and condiments Onion salt, garlic salt, seasoned salt, table salt, and sea salt. Worcestershire sauce. Tartar sauce. Barbecue sauce. Teriyaki sauce. Soy sauce, including reduced-sodium. Steak sauce. Canned and packaged gravies. Fish sauce. Oyster sauce. Cocktail sauce. Store-bought horseradish. Ketchup. Mustard. Meat flavorings and tenderizers. Bouillon cubes. Hot sauces. Pre-made or packaged marinades. Pre-made or packaged taco seasonings. Relishes. Regular salad dressings. Other foods Salted popcorn and pretzels. The items listed above may not be a complete list of foods and beverages you should avoid. Contact a dietitian for more information. Where to find more information National Heart,  Lung, and Blood Institute: https://wilson-eaton.com/ American Heart Association: www.heart.org Academy of Nutrition and Dietetics: www.eatright.[place]: www.kidney.org Summary The DASH eating plan is a healthy eating plan that has been shown to reduce high blood pressure (hypertension). It may also reduce your risk for type 2 diabetes, heart disease, and stroke. When on the DASH eating plan, aim to eat more  fresh fruits and vegetables, whole grains, lean proteins, low-fat dairy, and heart-healthy fats. With the DASH eating plan, you should limit salt (sodium) intake to 2,300 mg a day. If you have hypertension, you may need to reduce your sodium intake to 1,500 mg a day. Work with your health care provider or dietitian to adjust your eating plan to your individual calorie needs. This information is not intended to replace advice given to you by your health care provider. Make sure you discuss any questions you have with your health care provider. Document Revised: 09/10/2019 Document Reviewed: 09/10/2019 Elsevier Patient Education  2022 [employer].

## 2021-08-20 NOTE — Progress Notes (Signed)
   SUBJECTIVE:   CHIEF COMPLAINT / HPI:   Hypertension: - Medications: none. Previously declined prescription antihypertensives due to previous intolerance. Doesn't know names of medications. Had dizziness, muscle pain, headaches when taking.  Hasn't been on since 2020.  - counseled on DASH diet - taking potassium supplements. - Compliance: n/a - Checking BP at home: no - Denies any LE edema or symptoms of hypotension - Diet: low sodium diet - Exercise: going to PT for knee arthritis, pain, weakness.   BACK PAIN Duration:  chronic Mechanism of injury: lifting Location: L>R and low back Quality: sore Frequency: constant Radiation: none Aggravating factors: bending, standing Alleviating factors: rest, heat, NSAIDs, and muscle relaxer Status: stable Treatments attempted: rest, ice, heat, APAP, ibuprofen, aleve, and physical therapy  Relief with NSAIDs?: mild Nighttime pain:  yes Paresthesias / decreased sensation:  no Bowel / bladder incontinence:  no Fevers:  no Dysuria / urinary frequency:  no Taking advil 200mg  q6h prn.   OBJECTIVE:   BP (!) 152/100   Pulse 98   Temp 98.1 F (36.7 C)   Resp 16   Ht 5\' 2"  (1.575 m)   Wt 125 lb 3.2 oz (56.8 kg)   SpO2 99%   BMI 22.90 kg/m   Gen: well appearing, in NAD Card: Reg rate Lungs: Comfortable WOB on RA Ext: WWP, no edema   ASSESSMENT/PLAN:   Essential hypertension, benign Remains elevated, patient attributes to chronic pain but has not checked BP at home, doesn't have monitor. Rx sent for BP cuff.  Again recommended pharmacologic intervention and discussed risks of chronically elevated BP including cardiac and renal damage. Again declines treatment. Will obtain labs. Recommend DASH diet. F/u in one month with BP measurements. Continue PT exercises and work to control chronic pain.  Spondylolisthesis at L4-L5 level With chronic LBP. May increase dose of prn NSAID, muscle relaxer provided for spasm. Continue PT  exercises.      Myles Gip, DO

## 2021-08-20 NOTE — Assessment & Plan Note (Signed)
With chronic LBP. May increase dose of prn NSAID, muscle relaxer provided for spasm. Continue PT exercises.

## 2021-08-21 ENCOUNTER — Ambulatory Visit: Payer: Medicare Other | Admitting: Family Medicine

## 2021-08-21 ENCOUNTER — Encounter: Payer: Self-pay | Admitting: Family Medicine

## 2021-08-21 LAB — BASIC METABOLIC PANEL
BUN: 11 mg/dL (ref 7–25)
CO2: 30 mmol/L (ref 20–32)
Calcium: 9.5 mg/dL (ref 8.6–10.4)
Chloride: 103 mmol/L (ref 98–110)
Creat: 0.69 mg/dL (ref 0.60–1.00)
Glucose, Bld: 109 mg/dL — ABNORMAL HIGH (ref 65–99)
Potassium: 3.8 mmol/L (ref 3.5–5.3)
Sodium: 141 mmol/L (ref 135–146)

## 2021-09-17 ENCOUNTER — Ambulatory Visit: Payer: Medicare Other | Admitting: Family Medicine

## 2021-09-17 ENCOUNTER — Encounter: Payer: Self-pay | Admitting: Family Medicine

## 2021-09-18 ENCOUNTER — Ambulatory Visit (INDEPENDENT_AMBULATORY_CARE_PROVIDER_SITE_OTHER): Payer: Medicare Other | Admitting: Obstetrics & Gynecology

## 2021-09-18 ENCOUNTER — Other Ambulatory Visit: Payer: Self-pay

## 2021-09-18 ENCOUNTER — Encounter: Payer: Self-pay | Admitting: Obstetrics & Gynecology

## 2021-09-18 VITALS — Wt 124.0 lb

## 2021-09-18 DIAGNOSIS — N993 Prolapse of vaginal vault after hysterectomy: Secondary | ICD-10-CM | POA: Diagnosis not present

## 2021-09-18 DIAGNOSIS — N952 Postmenopausal atrophic vaginitis: Secondary | ICD-10-CM

## 2021-09-18 DIAGNOSIS — N8111 Cystocele, midline: Secondary | ICD-10-CM | POA: Diagnosis not present

## 2021-09-18 NOTE — Progress Notes (Signed)
HPI:      [name] is a 75 y.o. [id number] who presents today for her pessary follow up and examination related to her pelvic floor weakening.  Pt reports tolerating the pessary well with no vaginal bleeding and no vaginal discharge.  Symptoms of pelvic floor weakening have greatly improved. She is voiding and defecating without difficulty. She currently has a Gehrung #3 pessary.  Prior expulsion #2.  Feels this one slips or drops down partially at times.  PMHx: She  has a past medical history of Hypertension and Thyroid disease. Also,  has a past surgical history that includes Bunionectomy (Left, 2006); Tubal ligation (1969); Image guided sinus surgery (N/A, 02/24/2018); Maxillary antrostomy (Left, 02/24/2018); Ethmoidectomy (Left, 02/24/2018); Frontal sinus exploration (Left, 02/24/2018); Sphenoidectomy (Left, 02/24/2018); and Abdominal hysterectomy (1980)., family history includes Cancer in her mother; Dementia in her mother; Hypertension in her father; Lymphoma in her father.,  reports that she quit smoking about 27 years ago. Her smoking use included cigarettes. She has never used smokeless tobacco. She reports that she does not drink alcohol and does not use drugs.  She has a current medication list which includes the following prescription(s): biotin, blood pressure cuff, vitamin d, cyclobenzaprine, ibuprofen, levothyroxine, lutein, potassium, and turmeric. Also, is allergic to other.  Review of Systems  All other systems reviewed and are negative.  Objective: Wt 124 lb (56.2 kg)   BMI 22.68 kg/m  Physical Exam Constitutional:      General: She is not in acute distress.    Appearance: She is well-developed.  Genitourinary:     Bladder and urethral meatus normal.     Vaginal cuff intact.    No vaginal erythema or bleeding.     Anterior and posterior vaginal prolapse present.    Moderate vaginal atrophy present.     Right Adnexa: not tender and no mass present.    Left Adnexa: not tender  and no mass present.    Cervix is absent.     Uterus is absent.     Pelvic exam was performed with patient in the lithotomy position.  HENT:     Head: Normocephalic and atraumatic.     Nose: Nose normal.  Abdominal:     General: There is no distension.     Palpations: Abdomen is soft.     Tenderness: There is no abdominal tenderness.  Musculoskeletal:        General: Normal range of motion.  Neurological:     Mental Status: She is alert and oriented to person, place, and time.     Cranial Nerves: No cranial nerve deficit.  Skin:    General: Skin is warm and dry.  Psychiatric:        Attention and Perception: Attention normal.        Mood and Affect: Mood normal.        Speech: Speech normal.        Behavior: Behavior normal.        Cognition and Memory: Cognition normal.        Judgment: Judgment normal.    Pessary Care Pessary removed and cleaned.  Vagina checked - without erosions - pessary replaced.  A/P:1. Prolapse of vaginal vault after hysterectomy 2. Cystocele, midline 3. Vaginal atrophy Pessary was cleaned and replaced today.    Plan to upsize to #4 next visit Instructions given for care. Concerning symptoms to observe for are counseled to patient. Follow up scheduled for 3 months.  A total of 20  minutes were spent face-to-face with the patient as well as preparation, review, communication, and documentation during this encounter.   Barnett Applebaum, MD, [facility], [facility] 09/18/2021  10:23 AM

## 2021-09-18 NOTE — Patient Instructions (Signed)
Earle Gell, MD, Neurosurgery, [facility]

## 2021-09-19 ENCOUNTER — Ambulatory Visit: Payer: Medicare Other | Admitting: Family Medicine

## 2021-09-20 ENCOUNTER — Encounter: Payer: Self-pay | Admitting: Family Medicine

## 2021-09-24 ENCOUNTER — Encounter: Payer: Self-pay | Admitting: Internal Medicine

## 2021-09-24 ENCOUNTER — Ambulatory Visit (INDEPENDENT_AMBULATORY_CARE_PROVIDER_SITE_OTHER): Payer: Medicare Other | Admitting: Internal Medicine

## 2021-09-24 VITALS — BP 162/98 | HR 102 | Temp 97.8°F | Resp 16 | Ht 62.0 in | Wt 124.2 lb

## 2021-09-24 DIAGNOSIS — Z23 Encounter for immunization: Secondary | ICD-10-CM | POA: Diagnosis not present

## 2021-09-24 DIAGNOSIS — R197 Diarrhea, unspecified: Secondary | ICD-10-CM

## 2021-09-24 DIAGNOSIS — R030 Elevated blood-pressure reading, without diagnosis of hypertension: Secondary | ICD-10-CM

## 2021-09-24 DIAGNOSIS — M2550 Pain in unspecified joint: Secondary | ICD-10-CM

## 2021-09-24 DIAGNOSIS — R6889 Other general symptoms and signs: Secondary | ICD-10-CM

## 2021-09-24 DIAGNOSIS — R5383 Other fatigue: Secondary | ICD-10-CM | POA: Diagnosis not present

## 2021-09-24 NOTE — Patient Instructions (Signed)
It was great seeing you today!  Plan discussed at today's visit: -Blood work ordered today, results will be uploaded to [facility].   Follow up in: as needed.   Take care and let us know if you have any questions or concerns prior to your next visit.  Dr. Rosana Berger

## 2021-09-24 NOTE — Progress Notes (Signed)
Acute Office Visit  Subjective:    Patient ID: Annette Stevens, female    DOB: [birth year], 75 y.o.   MRN: [id number]  No chief complaint on file.   HPI Patient is in today for concern about possible Lyme's disease. Has on and off diarrhea, starting July but much more urgent and frequent lately. No changes in diet but appetite decreased, weight stable. No abdominal pain, nausea/vomiting, no urinary symptoms. Concerned because she may have had a tick bite over the summer, did not find a tick but is worried with increased watery bowel movements. Also has increased fatigue, cold intolerance and multiple joint pains.   Abdominal Complaint: watery,loose stools  -Duration: 2 weeks -Frequency: constant -Nature: bloating -No abdominal pain -Alleviating factors: Ibuprofen -Aggravating factors: Eating (no particular foods), goes straight through with watery BM -Treatments attempted:  Just taking Ibuprofen, which helps -Constipation: no -Diarrhea: yes,  -Episodes of diarrhea/day: 5-9 times a day for last 2 weeks -Mucous in the stool: no -Heartburn: no -Bloating: yes -Passing Gas: yes -Nausea: no -Vomiting: no -Melena or hematochezia: no -Fever: no -Weight loss: no -Change in Appetite: yes   Past Medical History:  Diagnosis Date   Hypertension    Thyroid disease     Past Surgical History:  Procedure Laterality Date   ABDOMINAL HYSTERECTOMY  1980   partial still has ovaries   BUNIONECTOMY Left 2006   ETHMOIDECTOMY Left 02/24/2018   Procedure: ETHMOIDECTOMY;  Surgeon: Clyde Canterbury, MD;  Location: [place];  Service: ENT;  Laterality: Left;   FRONTAL SINUS EXPLORATION Left 02/24/2018   Procedure: FRONTAL SINUS EXPLORATION;  Surgeon: Clyde Canterbury, MD;  Location: [facility];  Service: ENT;  Laterality: Left;   IMAGE GUIDED SINUS SURGERY N/A 02/24/2018   Procedure: IMAGE GUIDED SINUS SURGERY;  Surgeon: Clyde Canterbury, MD;  Location: [place];  Service: ENT;   Laterality: N/A;  GAVE DISK BRENDA 3-21 KP   MAXILLARY ANTROSTOMY Left 02/24/2018   Procedure: ENDSOCPIC MAXILLARY ANTROSTOMY TOTAL;  Surgeon: Clyde Canterbury, MD;  Location: [place];  Service: ENT;  Laterality: Left;   SPHENOIDECTOMY Left 02/24/2018   Procedure: Coralee Pesa;  Surgeon: Clyde Canterbury, MD;  Location: [place];  Service: ENT;  Laterality: Left;   TUBAL LIGATION  1969    Family History  Problem Relation Age of Onset   Dementia Mother    Cancer Mother        colon   Lymphoma Father    Hypertension Father     Social History   Socioeconomic History   Marital status: Widowed    Spouse name: Not on file   Number of children: 2   Years of education: Not on file   Highest education level: High school graduate  Occupational History   Not on file  Tobacco Use   Smoking status: Former    Types: Cigarettes    Quit date: 1995    Years since quitting: 27.9   Smokeless tobacco: Never  Vaping Use   Vaping Use: Never used  Substance and Sexual Activity   Alcohol use: No   Drug use: No   Sexual activity: Not on file  Other Topics Concern   Not on file  Social History Narrative   Not on file   Social Determinants of Health   Financial Resource Strain: Not on file  Food Insecurity: Not on file  Transportation Needs: Not on file  Physical Activity: Not on file  Stress: Not on file  Social Connections:  Not on file  Intimate Partner Violence: Not on file    Outpatient Medications Prior to Visit  Medication Sig Dispense Refill   Biotin 10000 MCG TABS Take 1 tablet by mouth daily. JUST on four days of the week 30 tablet    Blood Pressure Monitoring (BLOOD PRESSURE CUFF) MISC 1 each by Does not apply route daily. 1 each 0   Cholecalciferol (VITAMIN D) 50 MCG (2000 UT) CAPS Take 1 capsule (2,000 Units total) by mouth daily.     cyclobenzaprine (FLEXERIL) 5 MG tablet Take 1 tablet (5 mg total) by mouth daily as needed for muscle spasms. 30 tablet 0    ibuprofen (ADVIL,MOTRIN) 200 MG tablet Take 200 mg by mouth every 6 (six) hours as needed.     levothyroxine (EUTHYROX) 125 MCG tablet Take 1 tablet (125 mcg total) by mouth daily. 90 tablet 3   Lutein 10 MG TABS HerbaVision Lutein with bilberry, one by mouth daily     Potassium 99 MG TABS Take 1 tablet by mouth daily.     Turmeric 500 MG CAPS Take 1 tablet by mouth 2 (two) times daily.     No facility-administered medications prior to visit.    Allergies  Allergen Reactions   Other Other (See Comments)    Blood pressure medications    Review of Systems  Constitutional:  Positive for fatigue. Negative for appetite change, chills and fever.  Cardiovascular:  Negative for chest pain and palpitations.  Gastrointestinal:  Positive for diarrhea. Negative for abdominal pain, blood in stool, constipation, nausea and vomiting.  Genitourinary:  Negative for dysuria and hematuria.  Musculoskeletal:  Positive for arthralgias.  Skin: Negative.       Objective:    Physical Exam Constitutional:      Appearance: Normal appearance.  HENT:     Head: Normocephalic and atraumatic.  Eyes:     Conjunctiva/sclera: Conjunctivae normal.  Cardiovascular:     Rate and Rhythm: Normal rate and regular rhythm.  Pulmonary:     Effort: Pulmonary effort is normal.     Breath sounds: Normal breath sounds.  Abdominal:     General: Bowel sounds are normal. There is no distension.     Palpations: Abdomen is soft.     Tenderness: There is abdominal tenderness. There is no right CVA tenderness, left CVA tenderness, guarding or rebound.     Comments: Mild periumbilical pain to palpation  Musculoskeletal:     Right lower leg: No edema.     Left lower leg: No edema.  Skin:    General: Skin is warm and dry.  Neurological:     General: No focal deficit present.     Mental Status: She is alert. Mental status is at baseline.  Psychiatric:        Mood and Affect: Mood normal.        Behavior: Behavior normal.     BP (!) 162/98   Pulse (!) 102   Temp 97.8 F (36.6 C)   Resp 16   Ht 5\' 2"  (1.575 m)   Wt 124 lb 3.2 oz (56.3 kg)   SpO2 99%   BMI 22.72 kg/m  Wt Readings from Last 3 Encounters:  09/24/21 124 lb 3.2 oz (56.3 kg)  09/18/21 124 lb (56.2 kg)  08/20/21 125 lb 3.2 oz (56.8 kg)    Health Maintenance Due  Topic Date Due   Zoster Vaccines- Shingrix (1 of 2) Never done   Pneumonia Vaccine 30+ Years old (2 -  PCV) 09/07/2011   COVID-19 Vaccine (3 - Pfizer risk series) 06/08/2020    There are no preventive care reminders to display for this patient.   Lab Results  Component Value Date   TSH 0.81 04/17/2021   Lab Results  Component Value Date   WBC 7.7 12/31/2018   HGB 14.6 12/31/2018   HCT 42.8 12/31/2018   MCV 84.3 12/31/2018   PLT 323 12/31/2018   Lab Results  Component Value Date   NA 141 08/20/2021   K 3.8 08/20/2021   CO2 30 08/20/2021   GLUCOSE 109 (H) 08/20/2021   BUN 11 08/20/2021   CREATININE 0.69 08/20/2021   BILITOT 0.3 12/31/2018   ALKPHOS 86 09/06/2012   AST 15 12/31/2018   ALT 10 12/31/2018   PROT 7.0 12/31/2018   ALBUMIN 4.1 09/06/2012   CALCIUM 9.5 08/20/2021   ANIONGAP 7 09/06/2012   Lab Results  Component Value Date   CHOL 214 (H) 12/31/2018   Lab Results  Component Value Date   HDL 86 12/31/2018   Lab Results  Component Value Date   LDLCALC 107 (H) 12/31/2018   Lab Results  Component Value Date   TRIG 112 12/31/2018   Lab Results  Component Value Date   CHOLHDL 2.5 12/31/2018   No results found for: HGBA1C     Assessment & Plan:   1. Diarrhea, unspecified type/Other fatigue/Cold intolerance/Arthralgia, unspecified joint: Discussed obtaining CBC, BMP to rule out dehydration/infectious causes of diarrhea, as well as stool studies, rechecking TSH but patient does not want to do this. We will rule out Lyme's disease as this is her primary concern. Follow up as needed or if symptoms worsen or fail to improve.   - Lyme Disease  Abs IgG, IgM, IFA, CSF  me Disease Abs IgG, IgM, IFA, CSF  2. Need for influenza vaccination: Flu vaccine administered today.   - Flu Vaccine QUAD High Dose(Fluad)  3. Elevated blood pressure reading: Blood pressure elevated to 162/98, patient believes this is due to pain.    Teodora Medici, DO

## 2021-09-26 ENCOUNTER — Telehealth: Payer: Self-pay | Admitting: Internal Medicine

## 2021-09-26 ENCOUNTER — Telehealth: Payer: Self-pay

## 2021-09-26 LAB — B. BURGDORFI ANTIBODIES BY WB
B burgdorferi IgG Abs (IB): NEGATIVE
B burgdorferi IgM Abs (IB): NEGATIVE
Lyme Disease 18 kD IgG: NONREACTIVE
Lyme Disease 23 kD IgG: NONREACTIVE
Lyme Disease 23 kD IgM: NONREACTIVE
Lyme Disease 28 kD IgG: NONREACTIVE
Lyme Disease 30 kD IgG: NONREACTIVE
Lyme Disease 39 kD IgG: REACTIVE — AB
Lyme Disease 39 kD IgM: REACTIVE — AB
Lyme Disease 41 kD IgG: NONREACTIVE
Lyme Disease 41 kD IgM: NONREACTIVE
Lyme Disease 45 kD IgG: NONREACTIVE
Lyme Disease 58 kD IgG: REACTIVE — AB
Lyme Disease 66 kD IgG: NONREACTIVE
Lyme Disease 93 kD IgG: REACTIVE — AB

## 2021-09-26 LAB — TEST AUTHORIZATION: TEST CODE:: 8593

## 2021-09-26 NOTE — Telephone Encounter (Signed)
Copied from [place] [id number]. Topic: General - Other >> Sep 26, 2021  1:55 PM Annette Stevens, [place] C wrote: Reason for CRM: pt called in for her lab results. Please assist further,

## 2021-09-28 ENCOUNTER — Telehealth: Payer: Self-pay

## 2021-09-28 NOTE — Telephone Encounter (Signed)
I contacted patient and left voicemail for patient to call back to confirm prescheduled appointment.

## 2021-09-28 NOTE — Telephone Encounter (Signed)
-----   Message from [place], LPN sent at 68/12/7288 12:10 PM EST ----- Regarding: FW: Pessary Pessary arrived. Patient does not have apt for 3 mo f/u. Please contact patient to schedule 09/18/21 visit note: Pessary was cleaned and replaced today.    Plan to upsize to #4 next visit Instructions given for care. Concerning symptoms to observe for are counseled to patient. Follow up scheduled for 3 months ----- Message ----- From: Otila Kluver, LPN Sent: 21/10/1550   4:11 PM EST To: Otila Kluver, LPN Subject: RE: Pessary                                    Ordered 09/19/21- Per Burt Knack, order was rejected d/t pricing discrepancy listed on request (prices changed). I updated the request and resubmitted to [facility] agent requesting order be sent for reprocessing/approval with new pricing. ----- Message ----- From: Otila Kluver, LPN Sent: 05/23/2335   2:34 PM EST To: Otila Kluver, LPN Subject: Pessary                                        Please order a gehrung #4 pessary for this patient per [facility] (accidentally deleted message)09/18/21

## 2021-10-02 ENCOUNTER — Other Ambulatory Visit: Payer: Self-pay | Admitting: Obstetrics & Gynecology

## 2021-10-02 ENCOUNTER — Telehealth: Payer: Self-pay

## 2021-10-02 DIAGNOSIS — M4310 Spondylolisthesis, site unspecified: Secondary | ICD-10-CM

## 2021-10-02 NOTE — Telephone Encounter (Signed)
Pt calling; has tried calling ortho a week ago and hasn't heard back. Please ck on this.  [phone]

## 2021-10-02 NOTE — Telephone Encounter (Signed)
Referral placed.

## 2021-10-02 NOTE — Telephone Encounter (Signed)
Patient is aware of location date time.

## 2021-10-02 NOTE — Telephone Encounter (Signed)
I do not know, if she could provide name of whom she is seeking, and for what reasons.

## 2021-10-02 NOTE — Telephone Encounter (Signed)
Pt states it's Dr. Earle Gell; she has spondylolthesis.

## 2021-10-11 DIAGNOSIS — R29898 Other symptoms and signs involving the musculoskeletal system: Secondary | ICD-10-CM | POA: Diagnosis not present

## 2021-10-11 DIAGNOSIS — M4316 Spondylolisthesis, lumbar region: Secondary | ICD-10-CM | POA: Diagnosis not present

## 2021-10-12 ENCOUNTER — Other Ambulatory Visit: Payer: Self-pay | Admitting: Neurosurgery

## 2021-10-12 DIAGNOSIS — M4316 Spondylolisthesis, lumbar region: Secondary | ICD-10-CM

## 2021-10-12 DIAGNOSIS — R29898 Other symptoms and signs involving the musculoskeletal system: Secondary | ICD-10-CM

## 2021-10-23 ENCOUNTER — Telehealth: Payer: Self-pay | Admitting: Emergency Medicine

## 2021-10-23 ENCOUNTER — Telehealth: Payer: Self-pay | Admitting: Internal Medicine

## 2021-10-23 ENCOUNTER — Other Ambulatory Visit: Payer: Self-pay

## 2021-10-23 DIAGNOSIS — E039 Hypothyroidism, unspecified: Secondary | ICD-10-CM

## 2021-10-23 DIAGNOSIS — K529 Noninfective gastroenteritis and colitis, unspecified: Secondary | ICD-10-CM

## 2021-10-23 NOTE — Addendum Note (Signed)
Addended by: Teodora Medici on: 10/23/2021 11:15 AM   Modules accepted: Orders

## 2021-10-23 NOTE — Telephone Encounter (Signed)
Pt was informed abot labs being ordered, advised can come in any day from 8-12 or 2-4pm.

## 2021-10-23 NOTE — Telephone Encounter (Signed)
Pt is calling to follow up with Dr Rosana Berger on 09/24/21 dx Diarrhea Pt is wanting to know can Dr. Rosana Berger order her blood testing for Celiac Disease. Pt reports that she still has the diarrhea. Pts sister, daughter, mother had celiac disease.  Please advise CB- [phone]

## 2021-10-23 NOTE — Telephone Encounter (Signed)
Patient came by office for labs and stated she did not know she  was to have additional labs.  I spoke to patient and explained to her the labs that were requested for her to have. Patient refused TSH and BMP. She will do stool samples and calpoprotein lab. She stated she only do TSH once a year and had in June. I explained to her her dosage was changed and patient still refused.

## 2021-10-26 ENCOUNTER — Other Ambulatory Visit: Payer: Self-pay | Admitting: Internal Medicine

## 2021-10-26 DIAGNOSIS — R195 Other fecal abnormalities: Secondary | ICD-10-CM

## 2021-10-26 DIAGNOSIS — E039 Hypothyroidism, unspecified: Secondary | ICD-10-CM | POA: Diagnosis not present

## 2021-10-26 DIAGNOSIS — K529 Noninfective gastroenteritis and colitis, unspecified: Secondary | ICD-10-CM | POA: Diagnosis not present

## 2021-10-26 LAB — CELIAC DISEASE PANEL
(tTG) Ab, IgA: <1 U/mL
(tTG) Ab, IgG: <1 U/mL
Gliadin IgA: <1 U/mL
Gliadin IgG: <1 U/mL
Immunoglobulin A: 327 mg/dL — ABNORMAL HIGH (ref 70–320)

## 2021-11-01 ENCOUNTER — Other Ambulatory Visit: Payer: Self-pay

## 2021-11-01 ENCOUNTER — Ambulatory Visit
Admission: RE | Admit: 2021-11-01 | Discharge: 2021-11-01 | Disposition: A | Discharge status: Home / Self Care | Payer: Medicare Other | Source: Ambulatory Visit | Attending: Neurosurgery | Admitting: Neurosurgery

## 2021-11-01 DIAGNOSIS — R29898 Other symptoms and signs involving the musculoskeletal system: Secondary | ICD-10-CM | POA: Diagnosis not present

## 2021-11-01 DIAGNOSIS — M4316 Spondylolisthesis, lumbar region: Secondary | ICD-10-CM

## 2021-11-01 DIAGNOSIS — M4807 Spinal stenosis, lumbosacral region: Secondary | ICD-10-CM | POA: Diagnosis not present

## 2021-11-01 DIAGNOSIS — M5126 Other intervertebral disc displacement, lumbar region: Secondary | ICD-10-CM | POA: Diagnosis not present

## 2021-11-01 LAB — TEST AUTHORIZATION: TEST CODE:: 32114

## 2021-11-01 LAB — OVA AND PARASITE EXAMINATION
CONCENTRATE RESULT:: NONE SEEN
MICRO NUMBER:: 12842571
SPECIMEN QUALITY:: ADEQUATE
TRICHROME RESULT:: NONE SEEN

## 2021-11-01 LAB — CALPROTECTIN: Calprotectin: 1430 mcg/g — ABNORMAL HIGH

## 2021-11-02 NOTE — Addendum Note (Signed)
Addended by: Teodora Medici on: 11/02/2021 01:02 PM   Modules accepted: Orders

## 2021-11-06 ENCOUNTER — Telehealth: Payer: Self-pay

## 2021-11-06 DIAGNOSIS — M5416 Radiculopathy, lumbar region: Secondary | ICD-10-CM | POA: Diagnosis not present

## 2021-11-06 DIAGNOSIS — M4316 Spondylolisthesis, lumbar region: Secondary | ICD-10-CM | POA: Diagnosis not present

## 2021-11-06 NOTE — Telephone Encounter (Signed)
PATIENT SCHEDULED FOR 01/09/2022

## 2021-11-26 ENCOUNTER — Telehealth: Payer: Self-pay | Admitting: Internal Medicine

## 2021-11-26 NOTE — Telephone Encounter (Signed)
Copied from [place] [id number]. Topic: Medicare AWV >> Nov 26, 2021 10:15 AM Cher Nakai R wrote: Reason for CRM:  Left message for patient to call back and schedule Medicare Annual Wellness Visit (AWV) in office.   If unable to come into the office for AWV,  please offer to do virtually or by telephone.  Last AWV:  12/31/2018  Please schedule at anytime with [place].  40 minute appointment  Any questions, please contact me at [phone]

## 2021-12-17 ENCOUNTER — Ambulatory Visit: Payer: Medicare Other | Admitting: Obstetrics & Gynecology

## 2021-12-18 ENCOUNTER — Ambulatory Visit (INDEPENDENT_AMBULATORY_CARE_PROVIDER_SITE_OTHER): Payer: Medicare Other | Admitting: Obstetrics & Gynecology

## 2021-12-18 ENCOUNTER — Encounter: Payer: Self-pay | Admitting: Obstetrics & Gynecology

## 2021-12-18 ENCOUNTER — Other Ambulatory Visit: Payer: Self-pay

## 2021-12-18 VITALS — Wt 126.0 lb

## 2021-12-18 DIAGNOSIS — N8111 Cystocele, midline: Secondary | ICD-10-CM

## 2021-12-18 DIAGNOSIS — N952 Postmenopausal atrophic vaginitis: Secondary | ICD-10-CM

## 2021-12-18 DIAGNOSIS — N993 Prolapse of vaginal vault after hysterectomy: Secondary | ICD-10-CM | POA: Diagnosis not present

## 2021-12-18 NOTE — Progress Notes (Signed)
HPI:      [name] is a 76 y.o. [id number] who presents today for her pessary follow up and examination related to her pelvic floor weakening.  Pt reports tolerating the pessary well with no vaginal bleeding and no vaginal discharge.  Symptoms of pelvic floor weakening have greatly improved, although she still feels this pessary is a little loose and moving around some at times. She is voiding and defecating without difficulty. She currently has a gehrung #3 pessary.  PMHx: She  has a past medical history of Hypertension and Thyroid disease. Also,  has a past surgical history that includes Bunionectomy (Left, 2006); Tubal ligation (1969); Image guided sinus surgery (N/A, 02/24/2018); Maxillary antrostomy (Left, 02/24/2018); Ethmoidectomy (Left, 02/24/2018); Frontal sinus exploration (Left, 02/24/2018); Sphenoidectomy (Left, 02/24/2018); and Abdominal hysterectomy (1980)., family history includes Cancer in her mother; Dementia in her mother; Hypertension in her father; Lymphoma in her father.,  reports that she quit smoking about 28 years ago. Her smoking use included cigarettes. She has never used smokeless tobacco. She reports that she does not drink alcohol and does not use drugs.  She has a current medication list which includes the following prescription(s): biotin, blood pressure cuff, vitamin d, ibuprofen, levothyroxine, lutein, and turmeric. Also, is allergic to other.  Review of Systems  All other systems reviewed and are negative.  Objective: Wt 126 lb (57.2 kg)    BMI 23.05 kg/m  Physical Exam Constitutional:      General: She is not in acute distress.    Appearance: She is well-developed.  Genitourinary:     Bladder and urethral meatus normal.     Vaginal cuff intact.    No vaginal erythema or bleeding.     Anterior and apical vaginal prolapse present.    Moderate vaginal atrophy present.     Right Adnexa: not tender and no mass present.    Left Adnexa: not tender and no mass  present.    Cervix is absent.     Uterus is absent.     Pelvic exam was performed with patient in the lithotomy position.  HENT:     Head: Normocephalic and atraumatic.     Nose: Nose normal.  Abdominal:     General: There is no distension.     Palpations: Abdomen is soft.     Tenderness: There is no abdominal tenderness.  Musculoskeletal:        General: Normal range of motion.  Neurological:     Mental Status: She is alert and oriented to person, place, and time.     Cranial Nerves: No cranial nerve deficit.  Skin:    General: Skin is warm and dry.  Psychiatric:        Attention and Perception: Attention normal.        Mood and Affect: Mood normal.        Speech: Speech normal.        Behavior: Behavior normal.        Cognition and Memory: Cognition normal.        Judgment: Judgment normal.    Pessary Care Pessary removed and cleaned.  Vagina checked - without erosions - pessary replaced.  A/P:   ICD-10-CM   1. Prolapse of vaginal vault after hysterectomy  N99.3     2. Cystocele, midline  N81.11     3. Vaginal atrophy  N95.2      Pessary was cleaned and replaced with a GEHRUNG #4 today. Instructions given for care. Concerning  symptoms to observe for are counseled to patient. Follow up scheduled for 3 months.  A total of 20 minutes were spent face-to-face with the patient as well as preparation, review, communication, and documentation during this encounter.   Barnett Applebaum, MD, [facility], [facility] 12/18/2021  2:39 PM

## 2022-01-09 ENCOUNTER — Ambulatory Visit: Payer: Medicare Other | Admitting: Gastroenterology

## 2022-01-11 ENCOUNTER — Telehealth: Payer: Self-pay | Admitting: Internal Medicine

## 2022-01-11 NOTE — Telephone Encounter (Signed)
Copied from [place] [id number]. Topic: Medicare AWV ?>> Jan 11, 2022 12:03 PM Cher Nakai R wrote: ?Reason for CRM:  ?Left message for patient to call back and schedule Medicare Annual Wellness Visit (AWV) in office.  ? ?If unable to come into the office for AWV,  please offer to do virtually or by telephone. ? ?Last AWV: 12/31/2018 ? ?Please schedule at anytime with [place]. ? ?30 minute appointment for Virtual or phone ?45 minute appointment for in office or Initial virtual/phone ? ?Any questions, please contact me at [phone] ?

## 2022-01-22 ENCOUNTER — Telehealth: Payer: Self-pay

## 2022-01-22 NOTE — Telephone Encounter (Signed)
Patient is scheduled for 01/23/22 with RPh

## 2022-01-22 NOTE — Telephone Encounter (Signed)
Pt stating that the new pessary is not working and worse than the old one. She is requesting an appt with [facility] before he leaves. Please try to get pt an appt scheduled.  ?

## 2022-01-23 ENCOUNTER — Encounter: Payer: Self-pay | Admitting: Obstetrics & Gynecology

## 2022-01-23 ENCOUNTER — Ambulatory Visit (INDEPENDENT_AMBULATORY_CARE_PROVIDER_SITE_OTHER): Payer: Medicare Other | Admitting: Obstetrics & Gynecology

## 2022-01-23 VITALS — Wt 126.0 lb

## 2022-01-23 DIAGNOSIS — N8111 Cystocele, midline: Secondary | ICD-10-CM

## 2022-01-23 DIAGNOSIS — N952 Postmenopausal atrophic vaginitis: Secondary | ICD-10-CM

## 2022-01-23 DIAGNOSIS — N993 Prolapse of vaginal vault after hysterectomy: Secondary | ICD-10-CM | POA: Diagnosis not present

## 2022-01-23 NOTE — Progress Notes (Signed)
?  HPI: ?     [name] is a 76 y.o. [id number] who presents today for her pessary check up as she is having sx's of bulge and urinary hesitancy at times w this newer sized Gehrung (#4) pessary.  No VB or d/c. ? ?PMHx: ?She  has a past medical history of Hypertension and Thyroid disease. Also,  has a past surgical history that includes Bunionectomy (Left, 2006); Tubal ligation (1969); Image guided sinus surgery (N/A, 02/24/2018); Maxillary antrostomy (Left, 02/24/2018); Ethmoidectomy (Left, 02/24/2018); Frontal sinus exploration (Left, 02/24/2018); Sphenoidectomy (Left, 02/24/2018); and Abdominal hysterectomy (1980)., family history includes Cancer in her mother; Dementia in her mother; Hypertension in her father; Lymphoma in her father.,  reports that she quit smoking about 28 years ago. Her smoking use included cigarettes. She has never used smokeless tobacco. She reports that she does not drink alcohol and does not use drugs. ? ?She has a current medication list which includes the following prescription(s): biotin, blood pressure cuff, vitamin d, ibuprofen, levothyroxine, lutein, and turmeric. Also, is allergic to other. ? ?Review of Systems  ?All other systems reviewed and are negative. ? ?Objective: ?Wt 126 lb (57.2 kg)   BMI 23.05 kg/m?  ?Physical Exam ?Constitutional:   ?   General: She is not in acute distress. ?   Appearance: She is well-developed.  ?Genitourinary:  ?   Bladder and urethral meatus normal.  ?   Genitourinary Comments: Cuff intact/ no lesions ? ?Absent uterus and cervix  ?   Vaginal cuff intact. ?   No vaginal erythema or bleeding.  ? ?   Right Adnexa: not tender and no mass present. ?   Left Adnexa: not tender and no mass present. ?   Cervix is absent.  ?   Uterus is absent.  ?   Pelvic exam was performed with patient in the lithotomy position.  ?HENT:  ?   Head: Normocephalic and atraumatic.  ?   Nose: Nose normal.  ?Abdominal:  ?   General: There is no distension.  ?   Palpations: Abdomen is  soft.  ?   Tenderness: There is no abdominal tenderness.  ?Musculoskeletal:     ?   General: Normal range of motion.  ?Neurological:  ?   Mental Status: She is alert and oriented to person, place, and time.  ?   Cranial Nerves: No cranial nerve deficit.  ?Skin: ?   General: Skin is warm and dry.  ?Psychiatric:     ?   Attention and Perception: Attention normal.     ?   Mood and Affect: Mood normal.     ?   Speech: Speech normal.     ?   Behavior: Behavior normal.     ?   Cognition and Memory: Cognition normal.     ?   Judgment: Judgment normal.  ? ? ?Pessary Care ?Pessary removed and cleaned.  Vagina checked - without erosions - pessary replaced. ? ?A/P:1. Prolapse of vaginal vault after hysterectomy ?2. Cystocele, midline ?3. Vaginal atrophy ? ?Pessary was cleaned and replaced today with smaller size again (#3 Gehrung). ?Instructions given for care. ?Concerning symptoms to observe for are counseled to patient. ?Follow up scheduled for 3 months. ? ?A total of 20 minutes were spent face-to-face with the patient as well as preparation, review, communication, and documentation during this encounter.  ? ?Barnett Applebaum, MD, [place] ?[facility], [place] ?01/23/2022  8:38 AM ? ?

## 2022-04-10 ENCOUNTER — Other Ambulatory Visit: Payer: Self-pay | Admitting: Internal Medicine

## 2022-04-10 DIAGNOSIS — E039 Hypothyroidism, unspecified: Secondary | ICD-10-CM | POA: Diagnosis not present

## 2022-04-10 DIAGNOSIS — K529 Noninfective gastroenteritis and colitis, unspecified: Secondary | ICD-10-CM | POA: Diagnosis not present

## 2022-04-11 LAB — TSH: TSH: 0.64 mIU/L (ref 0.40–4.50)

## 2022-04-15 NOTE — Progress Notes (Signed)
Established Patient Office Visit  Subjective   Patient ID: Annette Stevens, female    DOB: 03/10/1946  Age: 76 y.o. MRN: [id number]  Chief Complaint  Patient presents with   Hypothyroidism    HPI Annette Stevens presents for follow up on chronic medical conditions.   Hypertension: -Medications: Nothing currently, had been on medications in the past but did not tolerate them. Doesn't remember which medications she was on. Does not want to be on medications.   Hypothyroidism: -Medications: Levothyroxine 125 mcg  -Patient is compliant with the above medication (s) at the above dose and reports no medication side effects.  -Last TSH: 04/10/22 TSH 0.64  Chronic Pain: -Has chronic back pain, left shoulder pain and right knee pain -Takes over the counter anti-inflammatory medications with tumeric     Review of Systems  Constitutional:  Negative for chills and fever.  Musculoskeletal:  Positive for joint pain.      Objective:     BP (!) 164/102   Pulse (!) 112   Temp 98.3 F (36.8 C)   Resp 16   Ht 5\' 2"  (1.575 m)   Wt 123 lb 3.2 oz (55.9 kg)   SpO2 98%   BMI 22.53 kg/m  BP Readings from Last 3 Encounters:  04/16/22 (!) 164/102  09/24/21 (!) 162/98  08/20/21 (!) 152/100   Wt Readings from Last 3 Encounters:  04/16/22 123 lb 3.2 oz (55.9 kg)  01/23/22 126 lb (57.2 kg)  12/18/21 126 lb (57.2 kg)      Physical Exam Constitutional:      Appearance: Normal appearance.  HENT:     Head: Normocephalic and atraumatic.  Eyes:     Conjunctiva/sclera: Conjunctivae normal.  Cardiovascular:     Rate and Rhythm: Normal rate and regular rhythm.  Pulmonary:     Effort: Pulmonary effort is normal.     Breath sounds: Normal breath sounds.  Musculoskeletal:        General: Swelling and tenderness present.     Right knee: Swelling, deformity and crepitus present. Normal range of motion. No tenderness. No LCL laxity, MCL laxity, ACL laxity or PCL laxity. Normal patellar mobility.      Instability Tests: Anterior drawer test negative. Posterior drawer test negative. Anterior Lachman test negative. Medial McMurray test negative and lateral McMurray test negative.     Right lower leg: No edema.     Left lower leg: No edema.     Comments: Right knee valgus deformity   Skin:    General: Skin is warm and dry.  Neurological:     General: No focal deficit present.     Mental Status: She is alert. Mental status is at baseline.  Psychiatric:        Mood and Affect: Mood normal.        Behavior: Behavior normal.      No results found for any visits on 04/16/22.  Last CBC Lab Results  Component Value Date   WBC 7.7 12/31/2018   HGB 14.6 12/31/2018   HCT 42.8 12/31/2018   MCV 84.3 12/31/2018   MCH 28.7 12/31/2018   RDW 12.8 12/31/2018   PLT 323 12/31/2018   Last metabolic panel Lab Results  Component Value Date   GLUCOSE 109 (H) 08/20/2021   NA 141 08/20/2021   K 3.8 08/20/2021   CL 103 08/20/2021   CO2 30 08/20/2021   BUN 11 08/20/2021   CREATININE 0.69 08/20/2021   GFRNONAA 88 12/27/2019   CALCIUM 9.5  08/20/2021   PROT 7.0 12/31/2018   ALBUMIN 4.1 09/06/2012   BILITOT 0.3 12/31/2018   ALKPHOS 86 09/06/2012   AST 15 12/31/2018   ALT 10 12/31/2018   ANIONGAP 7 09/06/2012   Last lipids Lab Results  Component Value Date   CHOL 214 (H) 12/31/2018   HDL 86 12/31/2018   LDLCALC 107 (H) 12/31/2018   TRIG 112 12/31/2018   CHOLHDL 2.5 12/31/2018   Last hemoglobin A1c No results found for: "HGBA1C" Last thyroid functions Lab Results  Component Value Date   TSH 0.64 04/10/2022   T4TOTAL 10.9 12/27/2019   Last vitamin D No results found for: "25OHVITD2", "25OHVITD3", "VD25OH" Last vitamin B12 and Folate No results found for: "VITAMINB12", "FOLATE"    The ASCVD Risk score (Arnett DK, et al., 2019) failed to calculate for the following reasons:   Cannot find a previous HDL lab   Cannot find a previous total cholesterol lab    Assessment &  Plan:   1. Chronic diarrhea: Started in November, stool calprotectin high. Referral to GI placed, appointment not until August. Neighbor recently had something wrong with septic tank, will retest for ova and parasites, stool culture.   - Ova and parasite examination - Salmonella/Shigella Cult, Campy EIA and Shiga Toxin reflex  2. Chronic pain of right knee: Physical exam with good range of motion, mild medial pain and effusion and crepitus. Right fibular head appears to be swollen and anteriorly deviated. Will obtain knee and fibula x-rays. Consider PT. Follow up in 1 month.   - DG Knee 1-2 Views Right; Future - DG Knee 4 Views W/Patella Right; Future - DG Tibia/Fibula Right; Future  3. Hypothyroidism, unspecified type: Patient wanting to increase medication but TSH 04/10/22 normal at 0.64 so a dose increase would be inappropriate. Continue Levothyroxine 125 mcg.    Return in about 4 weeks (around 05/14/2022).    Margarita Mail, DO

## 2022-04-16 ENCOUNTER — Encounter: Payer: Self-pay | Admitting: Internal Medicine

## 2022-04-16 ENCOUNTER — Ambulatory Visit
Admission: RE | Admit: 2022-04-16 | Discharge: 2022-04-16 | Disposition: A | Discharge status: Home / Self Care | Payer: Medicare Other | Attending: Internal Medicine | Admitting: Internal Medicine

## 2022-04-16 ENCOUNTER — Other Ambulatory Visit: Payer: Self-pay | Admitting: Internal Medicine

## 2022-04-16 ENCOUNTER — Ambulatory Visit
Admission: RE | Admit: 2022-04-16 | Discharge: 2022-04-16 | Disposition: A | Discharge status: Home / Self Care | Payer: Medicare Other | Source: Ambulatory Visit | Attending: Internal Medicine | Admitting: Internal Medicine

## 2022-04-16 ENCOUNTER — Ambulatory Visit (INDEPENDENT_AMBULATORY_CARE_PROVIDER_SITE_OTHER): Payer: Medicare Other | Admitting: Internal Medicine

## 2022-04-16 VITALS — BP 164/102 | HR 112 | Temp 98.3°F | Resp 16 | Ht 62.0 in | Wt 123.2 lb

## 2022-04-16 DIAGNOSIS — M1711 Unilateral primary osteoarthritis, right knee: Secondary | ICD-10-CM | POA: Diagnosis not present

## 2022-04-16 DIAGNOSIS — E039 Hypothyroidism, unspecified: Secondary | ICD-10-CM | POA: Diagnosis not present

## 2022-04-16 DIAGNOSIS — R519 Headache, unspecified: Secondary | ICD-10-CM | POA: Diagnosis not present

## 2022-04-16 DIAGNOSIS — G8929 Other chronic pain: Secondary | ICD-10-CM

## 2022-04-16 DIAGNOSIS — M25561 Pain in right knee: Secondary | ICD-10-CM | POA: Insufficient documentation

## 2022-04-16 DIAGNOSIS — K529 Noninfective gastroenteritis and colitis, unspecified: Secondary | ICD-10-CM | POA: Diagnosis not present

## 2022-04-16 MED ORDER — LEVOTHYROXINE SODIUM 125 MCG PO TABS: 125.0000 ug | ORAL_TABLET | Freq: Every day | ORAL | 1 refills | Status: DC

## 2022-04-22 DIAGNOSIS — N898 Other specified noninflammatory disorders of vagina: Secondary | ICD-10-CM | POA: Diagnosis not present

## 2022-04-22 DIAGNOSIS — N8110 Cystocele, unspecified: Secondary | ICD-10-CM | POA: Diagnosis not present

## 2022-04-22 DIAGNOSIS — T8389XA Other specified complication of genitourinary prosthetic devices, implants and grafts, initial encounter: Secondary | ICD-10-CM | POA: Diagnosis not present

## 2022-04-26 DIAGNOSIS — K529 Noninfective gastroenteritis and colitis, unspecified: Secondary | ICD-10-CM | POA: Diagnosis not present

## 2022-04-30 LAB — SALMONELLA/SHIGELLA CULT, CAMPY EIA AND SHIGA TOXIN RFL ECOLI
MICRO NUMBER: 13619761
MICRO NUMBER:: 13619762
MICRO NUMBER:: 13619763
Result:: NOT DETECTED
SHIGA RESULT:: NOT DETECTED
SPECIMEN QUALITY: ADEQUATE
SPECIMEN QUALITY:: ADEQUATE
SPECIMEN QUALITY:: ADEQUATE

## 2022-04-30 LAB — OVA AND PARASITE EXAMINATION
CONCENTRATE RESULT:: NONE SEEN
MICRO NUMBER:: 13618991
SPECIMEN QUALITY:: ADEQUATE
TRICHROME RESULT:: NONE SEEN

## 2022-05-20 ENCOUNTER — Encounter: Payer: Self-pay | Admitting: Internal Medicine

## 2022-05-20 ENCOUNTER — Ambulatory Visit (INDEPENDENT_AMBULATORY_CARE_PROVIDER_SITE_OTHER): Payer: Medicare Other | Admitting: Internal Medicine

## 2022-05-20 VITALS — BP 142/84 | HR 105 | Temp 98.6°F | Resp 16 | Ht 62.0 in | Wt 122.1 lb

## 2022-05-20 DIAGNOSIS — I998 Other disorder of circulatory system: Secondary | ICD-10-CM

## 2022-05-20 DIAGNOSIS — M170 Bilateral primary osteoarthritis of knee: Secondary | ICD-10-CM | POA: Diagnosis not present

## 2022-05-20 NOTE — Progress Notes (Signed)
Established Patient Office Visit  Subjective   Patient ID: Annette Stevens, female    DOB: [birth year]  Age: 76 y.o. MRN: [id number]  Chief Complaint  Patient presents with   Follow-up    HPI Annette Stevens presents for follow up on recent imaging and chronic medical conditions.  Hypertension: -Medications: Nothing currently, had been on medications in the past but did not tolerate them. Doesn't remember which medications she was on. Does not want to be on medications.   Hypothyroidism: -Medications: Levothyroxine 125 mcg  -Patient is compliant with the above medication (s) at the above dose and reports no medication side effects.  Continues to complain of dry skin and hair loss. -Last TSH: 04/10/22 TSH 0.64  Chronic Pain: -Has chronic back pain, left shoulder pain and right knee pain -Takes over the counter anti-inflammatory medications with tumeric  -Right knee x-ray from 04/16/2022 showing mild to moderate tricompartmental degenerative arthritis.  X-ray of the tibia/fibula from the same date showing vascular calcifications as well.  Does occasionally experience coldness in her bilateral distal extremities but denies symptoms of PAD.  Is not a current smoker.  Review of Systems  Constitutional:  Negative for chills and fever.  Musculoskeletal:  Positive for joint pain.      Objective:     BP (!) 142/84   Pulse (!) 105   Temp 98.6 F (37 C)   Resp 16   Ht '5\' 2"'$  (1.575 m)   Wt 122 lb 1.6 oz (55.4 kg)   SpO2 98%   BMI 22.33 kg/m  BP Readings from Last 3 Encounters:  05/20/22 (!) 142/84  04/16/22 (!) 164/102  09/24/21 (!) 162/98   Wt Readings from Last 3 Encounters:  05/20/22 122 lb 1.6 oz (55.4 kg)  04/16/22 123 lb 3.2 oz (55.9 kg)  01/23/22 126 lb (57.2 kg)      Physical Exam Constitutional:      Appearance: Normal appearance.  HENT:     Head: Normocephalic and atraumatic.  Eyes:     Conjunctiva/sclera: Conjunctivae normal.  Cardiovascular:     Rate and  Rhythm: Normal rate and regular rhythm.  Pulmonary:     Effort: Pulmonary effort is normal.     Breath sounds: Normal breath sounds.  Musculoskeletal:        General: Swelling and tenderness present.     Right knee: Swelling, deformity and crepitus present. Normal range of motion. No tenderness. No LCL laxity, MCL laxity, ACL laxity or PCL laxity. Normal patellar mobility.     Instability Tests: Anterior drawer test negative. Posterior drawer test negative. Anterior Lachman test negative. Medial McMurray test negative and lateral McMurray test negative.     Right lower leg: No edema.     Left lower leg: No edema.     Comments: Right knee valgus deformity   Skin:    General: Skin is warm and dry.     Coloration: Skin is pale.     Comments: Pale and cool distal extremities but with 2+ bilateral dorsal pedis pulses  Neurological:     General: No focal deficit present.     Mental Status: She is alert. Mental status is at baseline.  Psychiatric:        Mood and Affect: Mood normal.        Behavior: Behavior normal.    Last CBC Lab Results  Component Value Date   WBC 7.7 12/31/2018   HGB 14.6 12/31/2018   HCT 42.8 12/31/2018   MCV 84.3  12/31/2018   MCH 28.7 12/31/2018   RDW 12.8 12/31/2018   PLT 323 09/47/0962   Last metabolic panel Lab Results  Component Value Date   GLUCOSE 109 (H) 08/20/2021   NA 141 08/20/2021   K 3.8 08/20/2021   CL 103 08/20/2021   CO2 30 08/20/2021   BUN 11 08/20/2021   CREATININE 0.69 08/20/2021   GFRNONAA 88 12/27/2019   CALCIUM 9.5 08/20/2021   PROT 7.0 12/31/2018   ALBUMIN 4.1 09/06/2012   BILITOT 0.3 12/31/2018   ALKPHOS 86 09/06/2012   AST 15 12/31/2018   ALT 10 12/31/2018   ANIONGAP 7 09/06/2012   Last lipids Lab Results  Component Value Date   CHOL 214 (H) 12/31/2018   HDL 86 12/31/2018   LDLCALC 107 (H) 12/31/2018   TRIG 112 12/31/2018   CHOLHDL 2.5 12/31/2018   Last hemoglobin A1c No results found for: "HGBA1C" Last thyroid  functions Lab Results  Component Value Date   TSH 0.64 04/10/2022   T4TOTAL 10.9 12/27/2019   Last vitamin D No results found for: "25OHVITD2", "25OHVITD3", "VD25OH" Last vitamin B12 and Folate No results found for: "VITAMINB12", "FOLATE"    The ASCVD Risk score (Arnett DK, et al., 2019) failed to calculate for the following reasons:   Cannot find a previous HDL lab   Cannot find a previous total cholesterol lab    Assessment & Plan:   1. Osteoarthritis of both knees, unspecified osteoarthritis type: Discussed x-ray results and extensive arthritis.  The patient is uncertain about total knee replacement, as she lives by herself and has no one to help her postoperatively.  Referral to orthopedics placed today to discuss other treatment options.  Also discussed physical therapy with the patient, who would like to discuss this with orthopedics first.  - Ambulatory referral to Orthopedic Surgery  2. Vascular calcification: Seen on tibia/fibular x-ray from 04/16/2022.  Patient has pale and cold bilateral lower distal extremities without other signs of PAD.  Referral to vascular surgery for ABIs placed today.  - Ambulatory referral to Vascular Surgery   Return in about 3 months (around 08/20/2022).    Teodora Medici, DO

## 2022-05-20 NOTE — Patient Instructions (Addendum)
It was great seeing you today!  Plan discussed at today's visit: -Referrals to Orthopedics and Vascular Surgery placed today   Follow up in: 3 months   Take care and let us know if you have any questions or concerns prior to your next visit.  Dr. Rosana Berger

## 2022-06-10 ENCOUNTER — Ambulatory Visit (INDEPENDENT_AMBULATORY_CARE_PROVIDER_SITE_OTHER): Payer: Medicare Other | Admitting: Gastroenterology

## 2022-06-10 ENCOUNTER — Encounter: Payer: Self-pay | Admitting: Gastroenterology

## 2022-06-10 DIAGNOSIS — R197 Diarrhea, unspecified: Secondary | ICD-10-CM

## 2022-06-10 DIAGNOSIS — E349 Endocrine disorder, unspecified: Secondary | ICD-10-CM

## 2022-06-10 NOTE — Progress Notes (Addendum)
Jonathon Bellows MD, MRCP(U.K) [address]  [place]  [place], [place] [address]  Main: [phone]  Fax: [phone]   Gastroenterology Consultation  Referring Provider:     Teodora Medici, DO Primary Care Physician:  Teodora Medici, DO Primary Gastroenterologist:  Dr. Jonathon Bellows  Reason for Consultation:     Diarrhea        HPI:   Annette Stevens is a 76 y.o. y/o female referred for consultation & management  by Dr. Teodora Medici, DO.    She has a history of hypertension and hypothyroidism chronic pain.  Has been referred to see me for chronic diarrhea  04/10/2022 TSH normal In January 2023 fecal calprotectin was elevated at 1400, celiac serology negative no test for C. difficile or GI PCR performed.  Ova and parasite were negative: Seizure-like cultures were negative.  In January 2023 celiac serology was negative  He says that she been having increased frequency of bowel movements for the past 1 year.  Had a watery bowel movement after each meal.  Denies any blood in the stool.  Some weight loss probably 3 pounds.  Decrease in appetite.  Never had a colonoscopy.  Been taking 1 tablet of Advil at night daily.  She has multiple issues with her joints and her back.  Past Medical History:  Diagnosis Date   Hypertension    Thyroid disease     Past Surgical History:  Procedure Laterality Date   ABDOMINAL HYSTERECTOMY  1980   partial still has ovaries   BUNIONECTOMY Left 2006   ETHMOIDECTOMY Left 02/24/2018   Procedure: ETHMOIDECTOMY;  Surgeon: Clyde Canterbury, MD;  Location: [facility];  Service: ENT;  Laterality: Left;   FRONTAL SINUS EXPLORATION Left 02/24/2018   Procedure: FRONTAL SINUS EXPLORATION;  Surgeon: Clyde Canterbury, MD;  Location: [facility];  Service: ENT;  Laterality: Left;   IMAGE GUIDED SINUS SURGERY N/A 02/24/2018   Procedure: IMAGE GUIDED SINUS SURGERY;  Surgeon: Clyde Canterbury, MD;  Location: [facility];  Service: ENT;   Laterality: N/A;  GAVE DISK BRENDA 3-21 KP   MAXILLARY ANTROSTOMY Left 02/24/2018   Procedure: ENDSOCPIC MAXILLARY ANTROSTOMY TOTAL;  Surgeon: Clyde Canterbury, MD;  Location: [facility];  Service: ENT;  Laterality: Left;   SPHENOIDECTOMY Left 02/24/2018   Procedure: Coralee Pesa;  Surgeon: Clyde Canterbury, MD;  Location: [facility];  Service: ENT;  Laterality: Left;   [facility]    Prior to Admission medications   Medication Sig Start Date End Date Taking? Authorizing Provider  Biotin 10000 MCG TABS Take 1 tablet by mouth daily. JUST on four days of the week 12/17/18   Arnetha Courser, MD  Blood Pressure Monitoring (BLOOD PRESSURE CUFF) MISC 1 each by Does not apply route daily. 08/20/21   Myles Gip, DO  cholecalciferol (VITAMIN D3) 25 MCG (1000 UNIT) tablet Take 1,000 Units by mouth daily.    [provider]  estradiol (ESTRACE) 0.1 MG/GM vaginal cream Place vaginally. 04/22/22   [provider]  ibuprofen (ADVIL,MOTRIN) 200 MG tablet Take 200 mg by mouth every 6 (six) hours as needed.    [provider]  levothyroxine (EUTHYROX) 125 MCG tablet Take 1 tablet (125 mcg total) by mouth daily. 04/16/22   Myles Gip, DO  Turmeric 500 MG TABS Take by mouth.    [provider]    Family History  Problem Relation Age of Onset   Dementia Mother    Cancer Mother  colon   Lymphoma Father    Hypertension Father      Social History   Tobacco Use   Smoking status: Former    Types: Cigarettes    Quit date: 1995    Years since quitting: 28.6   Smokeless tobacco: Never  Vaping Use   Vaping Use: Never used  Substance Use Topics   Alcohol use: No   Drug use: No    Allergies as of 06/10/2022 - Review Complete 05/20/2022  Allergen Reaction Noted   Ace inhibitors  04/22/2022   Beta adrenergic blockers Other (See Comments) 04/22/2022   Other Other (See Comments) 09/19/2014    Review of Systems:    All systems  reviewed and negative except where noted in HPI.   Physical Exam:  There were no vitals taken for this visit. No LMP recorded. Patient has had a hysterectomy. Psych:  Alert and cooperative. Normal mood and affect. General:   Alert,  Well-developed, well-nourished, pleasant and cooperative in NAD Head:  Normocephalic and atraumatic. Neurologic:  Alert and oriented x3;  grossly normal neurologically. Psych:  Alert and cooperative. Normal mood and affect.  Imaging Studies: No results found.  Assessment and Plan:   Annette Stevens is a 76 y.o. y/o female has been referred for diarrhea and elevated fecal calprotectin of 1400.  Differentials include inflammatory bowel disease versus NSAID related colitis.  She has been taking Advil daily for at least a year   Plan 1.  Stool studies with GI PCR, C. difficile 2.  Diagnostic colonoscopy 3.  Stop NSAIDs including Advil   I have discussed alternative options, risks & benefits,  which include, but are not limited to, bleeding, infection, perforation,respiratory complication & drug reaction.  The patient agrees with this plan & written consent will be obtained.     Follow up in after colonoscopy  Dr Jonathon Bellows MD,MRCP(U.K)    Addendum 10/28/2022:   The patient informed our office that she never had a watery bowel movement , never consented to a colonoscopy , weight loss was 13 lbs. This is the addendum to note on her  her initial visit based on the feedback suggesting a correction to the facts .   Dr Jonathon Bellows MD,MRCP [facility]) Gastroenterology/Hepatology Pager: [phone]

## 2022-06-26 DIAGNOSIS — T8389XA Other specified complication of genitourinary prosthetic devices, implants and grafts, initial encounter: Secondary | ICD-10-CM | POA: Diagnosis not present

## 2022-06-26 DIAGNOSIS — N8110 Cystocele, unspecified: Secondary | ICD-10-CM | POA: Diagnosis not present

## 2022-06-26 DIAGNOSIS — N898 Other specified noninflammatory disorders of vagina: Secondary | ICD-10-CM | POA: Diagnosis not present

## 2022-07-01 DIAGNOSIS — M170 Bilateral primary osteoarthritis of knee: Secondary | ICD-10-CM | POA: Diagnosis not present

## 2022-07-19 DIAGNOSIS — M1711 Unilateral primary osteoarthritis, right knee: Secondary | ICD-10-CM | POA: Diagnosis not present

## 2022-07-26 DIAGNOSIS — M170 Bilateral primary osteoarthritis of knee: Secondary | ICD-10-CM | POA: Diagnosis not present

## 2022-08-01 DIAGNOSIS — R197 Diarrhea, unspecified: Secondary | ICD-10-CM | POA: Diagnosis not present

## 2022-08-01 DIAGNOSIS — E349 Endocrine disorder, unspecified: Secondary | ICD-10-CM | POA: Diagnosis not present

## 2022-08-02 DIAGNOSIS — M1712 Unilateral primary osteoarthritis, left knee: Secondary | ICD-10-CM | POA: Diagnosis not present

## 2022-08-07 LAB — GI PROFILE, STOOL, PCR

## 2022-08-07 LAB — COMPREHENSIVE METABOLIC PANEL
ALT: 8 IU/L (ref 0–32)
AST: 14 IU/L (ref 0–40)
Albumin/Globulin Ratio: 1.5 (ref 1.2–2.2)
Albumin: 4.1 g/dL (ref 3.8–4.8)
Alkaline Phosphatase: 98 IU/L (ref 44–121)
BUN/Creatinine Ratio: 13 (ref 12–28)
BUN: 9 mg/dL (ref 8–27)
Bilirubin Total: <0.2 mg/dL (ref 0.0–1.2)
CO2: 23 mmol/L (ref 20–29)
Calcium: 9.4 mg/dL (ref 8.7–10.3)
Chloride: 101 mmol/L (ref 96–106)
Creatinine, Ser: 0.68 mg/dL (ref 0.57–1.00)
Globulin, Total: 2.7 g/dL (ref 1.5–4.5)
Glucose: 133 mg/dL — ABNORMAL HIGH (ref 70–99)
Potassium: 3.5 mmol/L (ref 3.5–5.2)
Sodium: 141 mmol/L (ref 134–144)
Total Protein: 6.8 g/dL (ref 6.0–8.5)
eGFR: 90 mL/min/{1.73_m2} (ref >59)

## 2022-08-07 LAB — CBC WITH DIFFERENTIAL/PLATELET
Basophils Absolute: 0 10*3/uL (ref 0.0–0.2)
Basos: 1 %
EOS (ABSOLUTE): 0.1 10*3/uL (ref 0.0–0.4)
Eos: 1 %
Hematocrit: 45.6 % (ref 34.0–46.6)
Hemoglobin: 14.6 g/dL (ref 11.1–15.9)
Immature Grans (Abs): 0 10*3/uL (ref 0.0–0.1)
Immature Granulocytes: 0 %
Lymphocytes Absolute: 1.4 10*3/uL (ref 0.7–3.1)
Lymphs: 19 %
MCH: 27.5 pg (ref 26.6–33.0)
MCHC: 32 g/dL (ref 31.5–35.7)
MCV: 86 fL (ref 79–97)
Monocytes Absolute: 0.4 10*3/uL (ref 0.1–0.9)
Monocytes: 6 %
Neutrophils Absolute: 5.3 10*3/uL (ref 1.4–7.0)
Neutrophils: 73 %
Platelets: 315 10*3/uL (ref 150–450)
RBC: 5.3 x10E6/uL — ABNORMAL HIGH (ref 3.77–5.28)
RDW: 12.8 % (ref 11.7–15.4)
WBC: 7.2 10*3/uL (ref 3.4–10.8)

## 2022-08-07 LAB — C-REACTIVE PROTEIN: CRP: 6 mg/L (ref 0–10)

## 2022-08-07 LAB — CALPROTECTIN, FECAL: Calprotectin, Fecal: 206 ug/g — ABNORMAL HIGH (ref 0–120)

## 2022-08-07 NOTE — Progress Notes (Signed)
No infection seen but inflammation seen proceed with colonoscopy

## 2022-08-09 ENCOUNTER — Telehealth: Payer: Self-pay

## 2022-08-09 NOTE — Telephone Encounter (Signed)
Called patient and left her a voicemail to call me back so we could schedule her a colonoscopy since her stool did not show infection but inflammation. Awaiting for her call.

## 2022-08-09 NOTE — Telephone Encounter (Signed)
-----   Message from Jonathon Bellows, MD sent at 08/07/2022  7:22 AM EDT ----- No infection seen but inflammation seen proceed with colonoscopy

## 2022-08-14 DIAGNOSIS — M19011 Primary osteoarthritis, right shoulder: Secondary | ICD-10-CM | POA: Diagnosis not present

## 2022-08-19 ENCOUNTER — Ambulatory Visit (INDEPENDENT_AMBULATORY_CARE_PROVIDER_SITE_OTHER): Payer: Medicare Other | Admitting: Internal Medicine

## 2022-08-19 ENCOUNTER — Encounter: Payer: Self-pay | Admitting: Internal Medicine

## 2022-08-19 VITALS — BP 202/118 | HR 102 | Temp 98.4°F | Resp 16 | Ht 62.0 in | Wt 122.9 lb

## 2022-08-19 DIAGNOSIS — E039 Hypothyroidism, unspecified: Secondary | ICD-10-CM | POA: Diagnosis not present

## 2022-08-19 DIAGNOSIS — Z23 Encounter for immunization: Secondary | ICD-10-CM | POA: Diagnosis not present

## 2022-08-19 DIAGNOSIS — I10 Essential (primary) hypertension: Secondary | ICD-10-CM | POA: Diagnosis not present

## 2022-08-19 MED ORDER — LEVOTHYROXINE SODIUM 125 MCG PO TABS: 125.0000 ug | ORAL_TABLET | Freq: Every day | ORAL | 1 refills | Status: DC

## 2022-08-19 NOTE — Progress Notes (Signed)
Established Patient Office Visit  Subjective   Patient ID: Annette Stevens, female    DOB: [birth year]  Age: 76 y.o. MRN: [id number]  Chief Complaint  Patient presents with   Follow-up   Hypothyroidism    HPI [name] presents for follow up on recent imaging and chronic medical conditions.  Hypertension: -Medications: Nothing currently, had been on medications in the past but did not tolerate them. Doesn't remember which medications she was on. Does not want to be on medications.  -Blood pressure very high today, patient states its from her recent fluoro injection and she still does not want to talk about any blood pressure medications.  Hypothyroidism: -Medications: Levothyroxine 125 mcg  -Patient is compliant with the above medication (s) at the above dose and reports no medication side effects.   -Last TSH: 04/10/22 TSH 0.64  Chronic Pain: -Has chronic back pain, left shoulder pain and right knee pain -Takes over the counter anti-inflammatory medications with tumeric  -Right knee x-ray from 04/16/2022 showing mild to moderate tricompartmental degenerative arthritis.  X-ray of the tibia/fibula from the same date showing vascular calcifications as well.  Does occasionally experience coldness in her bilateral distal extremities but denies symptoms of PAD.  Is not a current smoker. -Was evaluated by Orthopedics on 08/16/22, underwent hyaluronic acid injections in her knees as well as intra-articular shoulder injection under fluoro. States the knee injections went well but the fluoro hasn't started working yet, had it done about 5 days ago   Chronic Diarrhea: -Has been going on for months now -Stool studies negative, calprotectin elevated but improved from when it was checked in January -Evaluated by GI 06/10/22 who recommends diagnostic colonoscopy however the patient does not want to schedule this test -Patient has started to eat licorice on a daily basis for its anti-inflammatory  properties, which she states has helped her bowel movements somewhat.  Review of Systems  Constitutional:  Negative for chills and fever.  Gastrointestinal:  Negative for diarrhea.  Musculoskeletal:  Positive for joint pain.     Objective:     BP (!) 202/118   Pulse (!) 102   Temp 98.4 F (36.9 C)   Resp 16   Ht '5\' 2"'$  (1.575 m)   Wt 122 lb 14.4 oz (55.7 kg)   SpO2 99%   BMI 22.48 kg/m  BP Readings from Last 3 Encounters:  08/19/22 (!) 202/118  05/20/22 (!) 142/84  04/16/22 (!) 164/102   Wt Readings from Last 3 Encounters:  08/19/22 122 lb 14.4 oz (55.7 kg)  06/10/22 120 lb (54.4 kg)  05/20/22 122 lb 1.6 oz (55.4 kg)      Physical Exam Constitutional:      Appearance: Normal appearance.  HENT:     Head: Normocephalic and atraumatic.  Eyes:     Conjunctiva/sclera: Conjunctivae normal.  Cardiovascular:     Rate and Rhythm: Normal rate and regular rhythm.  Pulmonary:     Effort: Pulmonary effort is normal.     Breath sounds: Normal breath sounds.  Skin:    General: Skin is warm and dry.  Neurological:     General: No focal deficit present.     Mental Status: She is alert. Mental status is at baseline.  Psychiatric:        Mood and Affect: Mood normal.        Behavior: Behavior normal.    Last CBC Lab Results  Component Value Date   WBC 7.2 08/01/2022   HGB 14.6 08/01/2022  HCT 45.6 08/01/2022   MCV 86 08/01/2022   MCH 27.5 08/01/2022   RDW 12.8 08/01/2022   PLT 315 59/29/2446   Last metabolic panel Lab Results  Component Value Date   GLUCOSE 133 (H) 08/01/2022   NA 141 08/01/2022   K 3.5 08/01/2022   CL 101 08/01/2022   CO2 23 08/01/2022   BUN 9 08/01/2022   CREATININE 0.68 08/01/2022   GFRNONAA 88 12/27/2019   CALCIUM 9.4 08/01/2022   PROT 6.8 08/01/2022   ALBUMIN 4.1 08/01/2022   LABGLOB 2.7 08/01/2022   AGRATIO 1.5 08/01/2022   BILITOT <0.2 08/01/2022   ALKPHOS 98 08/01/2022   AST 14 08/01/2022   ALT 8 08/01/2022   ANIONGAP 7  09/06/2012   Last lipids Lab Results  Component Value Date   CHOL 214 (H) 12/31/2018   HDL 86 12/31/2018   LDLCALC 107 (H) 12/31/2018   TRIG 112 12/31/2018   CHOLHDL 2.5 12/31/2018   Last hemoglobin A1c No results found for: "HGBA1C" Last thyroid functions Lab Results  Component Value Date   TSH 0.64 04/10/2022   T4TOTAL 10.9 12/27/2019   Last vitamin D No results found for: "25OHVITD2", "25OHVITD3", "VD25OH" Last vitamin B12 and Folate No results found for: "VITAMINB12", "FOLATE"    The ASCVD Risk score (Arnett DK, et al., 2019) failed to calculate for the following reasons:   The valid systolic blood pressure range is 90 to 200 mmHg   Cannot find a previous HDL lab   Cannot find a previous total cholesterol lab    Assessment & Plan:   1. Hypothyroidism, unspecified type: Stable.  Continue levothyroxine 125 mcg daily, refilled today.    - levothyroxine (EUTHYROX) 125 MCG tablet; Take 1 tablet (125 mcg total) by mouth daily.  Dispense: 90 tablet; Refill: 1  2. Essential hypertension, benign: Blood pressure extremely elevated, which patient states is due to her fluoroscopy injection in her shoulder she had 5 days ago.  Of note her blood pressure is usually elevated, however patient has had multiple reactions to medications in the past and is adamantly against starting medications.  Reviewed risks of untreated hypertension with the patient.  Follow-up in 6 months for recheck.  3. Need for influenza vaccination: Flu vaccine administered today.  - Flu Vaccine QUAD High Dose(Fluad)   Return for AWV W/ nurse.    Teodora Medici, DO

## 2022-08-19 NOTE — Patient Instructions (Signed)
It was great seeing you today!  Plan discussed at today's visit: -Medication refilled -Flu shot given today -Can make nurse's visit for Tdap, can discuss pneumonia vaccine at follow up   Follow up in: 6 months   Take care and let us know if you have any questions or concerns prior to your next visit.  Dr. Rosana Berger

## 2022-10-10 ENCOUNTER — Ambulatory Visit: Payer: Self-pay | Admitting: *Deleted

## 2022-10-10 NOTE — Progress Notes (Addendum)
   Acute Office Visit  Subjective:     Patient ID: Annette Stevens, female    DOB: 29-Mar-1946, 76 y.o.   MRN: [id number]  Chief Complaint  Patient presents with   Irregular Heart Beat    HPI Patient is in today for irregular heart rhythm. Started a few weeks ago. She did receive an epidural injection in her right shoulder on October 30th. She will noticed an extra heart beat that is fairly consistent. She has no other symptoms, no chest pain, shortness of breath, pre-syncope, headaches, blurred vision. She is on Levothyroxine. She does have HTN but has allergies to medications is not being treated.    Review of Systems  Constitutional:  Negative for chills and fever.  Eyes:  Negative for blurred vision.  Respiratory:  Negative for shortness of breath.   Cardiovascular:  Positive for palpitations. Negative for chest pain.  Neurological:  Negative for dizziness, loss of consciousness, weakness and headaches.        Objective:    BP (!) 212/110   Pulse (!) 105   Temp 98 F (36.7 C)   Resp 16   Ht 5' 2"$  (1.575 m)   Wt 124 lb 8 oz (56.5 kg)   SpO2 98%   BMI 22.77 kg/m    Physical Exam Constitutional:      Appearance: Normal appearance.  HENT:     Head: Normocephalic and atraumatic.  Eyes:     Conjunctiva/sclera: Conjunctivae normal.  Cardiovascular:     Rate and Rhythm: Normal rate. Rhythm irregular.     Comments: Extra beat present but does not appear regular  Pulmonary:     Effort: Pulmonary effort is normal.     Breath sounds: Normal breath sounds.  Skin:    General: Skin is warm and dry.  Neurological:     General: No focal deficit present.     Mental Status: She is alert. Mental status is at baseline.  Psychiatric:        Mood and Affect: Mood normal.        Behavior: Behavior normal.     No results found for any visits on 10/11/22.      Assessment & Plan:   1. Irregular heartbeat: Irregular heart beat on exam, EKG with regular p waves, slight atrial  enlargement but an extra beat was noted. Ventricular rate 91. Will obtain labs to rule out metabolic cause including TSH. Epidural from October could be playing a role. Referral to Cardiology placed but discussed with patient that if she develops chest pain, shortness of breath or pre-syncope she needs to go to the ER. Follow up after labs.  - EKG 12-Lead - CBC w/Diff/Platelet - COMPLETE METABOLIC PANEL WITH GFR - Thyroid Panel With TSH - Ambulatory referral to Cardiology   Return if symptoms worsen or fail to improve.  Teodora Medici, DO

## 2022-10-10 NOTE — Telephone Encounter (Signed)
Has appt. tomorrow

## 2022-10-10 NOTE — Telephone Encounter (Signed)
  Chief Complaint: irregular heart beat yesterday  Symptoms: felt "funny" felt like heart beat irregular for a few seconds and stopped. Heart beat 80 now and regular  Frequency: yesterday  Pertinent Negatives: Patient denies chest pain no difficulty breathing no dizziness no sweating  Disposition: '[]'$ ED /'[]'$ Urgent Care (no appt availability in office) / '[x]'$ Appointment(In office/virtual)/ '[]'$  [facility]/ '[]'$ Home Care/ '[]'$ Refused Recommended Disposition /'[]'$ [facility]/ '[]'$  Follow-up with PCP Additional Notes:   Recommended patient to be evaluated in 4-24 hours. If irregular heart beat noted again to go to UC/ ED. Appt scheduled for tomorrow with PCP. Patient verbalized understanding.     Reason for Disposition  Age > 60 years  (Exception: Brief heartbeat symptoms that went away and now feels well.)  Answer Assessment - Initial Assessment Questions 1. DESCRIPTION: "Please describe your heart rate or heartbeat that you are having" (e.g., fast/slow, regular/irregular, skipped or extra beats, "palpitations")     Irregular  2. ONSET: "When did it start?" (Minutes, hours or days)      Yesterday afternoon  3. DURATION: "How long does it last" (e.g., seconds, minutes, hours)     2-3 minutes  4. PATTERN "Does it come and go, or has it been constant since it started?"  "Does it get worse with exertion?"   "Are you feeling it now?"     Coming and going  5. TAP: "Using your hand, can you tap out what you are feeling on a chair or table in front of you, so that I can hear?" (Note: not all patients can do this)       na 6. HEART RATE: "Can you tell me your heart rate?" "How many beats in 15 seconds?"  (Note: not all patients can do this)       80 and regular now  7. RECURRENT SYMPTOM: "Have you ever had this before?" If Yes, ask: "When was the last time?" and "What happened that time?"      Yes yesterday  8. CAUSE: "What do you think is causing the palpitations?"     Na  9. CARDIAC  HISTORY: "Do you have any history of heart disease?" (e.g., heart attack, angina, bypass surgery, angioplasty, arrhythmia)      na 10. OTHER SYMPTOMS: "Do you have any other symptoms?" (e.g., dizziness, chest pain, sweating, difficulty breathing)       Denies sx now  11. PREGNANCY: "Is there any chance you are pregnant?" "When was your last menstrual period?"       na  Protocols used: Heart Rate and Heartbeat Questions-A-AH

## 2022-10-11 ENCOUNTER — Encounter: Payer: Self-pay | Admitting: Internal Medicine

## 2022-10-11 ENCOUNTER — Ambulatory Visit (INDEPENDENT_AMBULATORY_CARE_PROVIDER_SITE_OTHER): Payer: Medicare Other | Admitting: Internal Medicine

## 2022-10-11 VITALS — BP 212/110 | HR 105 | Temp 98.0°F | Resp 16 | Ht 62.0 in | Wt 124.5 lb

## 2022-10-11 DIAGNOSIS — I499 Cardiac arrhythmia, unspecified: Secondary | ICD-10-CM | POA: Diagnosis not present

## 2022-10-11 NOTE — Patient Instructions (Signed)
It was great seeing you today!  Plan discussed at today's visit: -Blood work ordered today, results will be uploaded to [facility].  -Referral to Cardiology placed -If you develop chest pain, shortness of breath or feel like you're going to pass out you have to go to the ER  Follow up in: as needed  Take care and let us know if you have any questions or concerns prior to your next visit.  Dr. Rosana Berger

## 2022-10-12 LAB — CBC WITH DIFFERENTIAL/PLATELET
Absolute Monocytes: 490 cells/uL (ref 200–950)
Basophils Absolute: 62 cells/uL (ref 0–200)
Basophils Relative: 0.9 %
Eosinophils Absolute: 62 cells/uL (ref 15–500)
Eosinophils Relative: 0.9 %
HCT: 43.3 % (ref 35.0–45.0)
Hemoglobin: 14.3 g/dL (ref 11.7–15.5)
Lymphs Abs: 1325 cells/uL (ref 850–3900)
MCH: 28 pg (ref 27.0–33.0)
MCHC: 33 g/dL (ref 32.0–36.0)
MCV: 84.7 fL (ref 80.0–100.0)
MPV: 11.3 fL (ref 7.5–12.5)
Monocytes Relative: 7.1 %
Neutro Abs: 4961 cells/uL (ref 1500–7800)
Neutrophils Relative %: 71.9 %
Platelets: 331 10*3/uL (ref 140–400)
RBC: 5.11 10*6/uL — ABNORMAL HIGH (ref 3.80–5.10)
RDW: 13.1 % (ref 11.0–15.0)
Total Lymphocyte: 19.2 %
WBC: 6.9 10*3/uL (ref 3.8–10.8)

## 2022-10-12 LAB — THYROID PANEL WITH TSH
Free Thyroxine Index: 3.5 (ref 1.4–3.8)
T3 Uptake: 30 % (ref 22–35)
T4, Total: 11.7 ug/dL (ref 5.1–11.9)
TSH: 0.9 mIU/L (ref 0.40–4.50)

## 2022-10-12 LAB — COMPLETE METABOLIC PANEL WITH GFR
AG Ratio: 1.4 (calc) (ref 1.0–2.5)
ALT: 8 U/L (ref 6–29)
AST: 14 U/L (ref 10–35)
Albumin: 3.7 g/dL (ref 3.6–5.1)
Alkaline phosphatase (APISO): 76 U/L (ref 37–153)
BUN/Creatinine Ratio: 21 (calc) (ref 6–22)
BUN: 12 mg/dL (ref 7–25)
CO2: 32 mmol/L (ref 20–32)
Calcium: 9 mg/dL (ref 8.6–10.4)
Chloride: 103 mmol/L (ref 98–110)
Creat: 0.56 mg/dL — ABNORMAL LOW (ref 0.60–1.00)
Globulin: 2.6 g/dL (calc) (ref 1.9–3.7)
Glucose, Bld: 102 mg/dL — ABNORMAL HIGH (ref 65–99)
Potassium: 3 mmol/L — ABNORMAL LOW (ref 3.5–5.3)
Sodium: 144 mmol/L (ref 135–146)
Total Bilirubin: 0.4 mg/dL (ref 0.2–1.2)
Total Protein: 6.3 g/dL (ref 6.1–8.1)
eGFR: 94 mL/min/{1.73_m2} (ref >=60)

## 2022-10-30 ENCOUNTER — Other Ambulatory Visit: Payer: Self-pay

## 2022-11-22 ENCOUNTER — Encounter: Payer: Self-pay | Admitting: Cardiology

## 2022-11-22 ENCOUNTER — Ambulatory Visit (INDEPENDENT_AMBULATORY_CARE_PROVIDER_SITE_OTHER): Payer: Medicare Other

## 2022-11-22 ENCOUNTER — Ambulatory Visit: Payer: Medicare Other | Attending: Cardiology | Admitting: Cardiology

## 2022-11-22 VITALS — BP 172/102 | HR 79 | Ht 62.0 in | Wt 124.8 lb

## 2022-11-22 DIAGNOSIS — R002 Palpitations: Secondary | ICD-10-CM | POA: Diagnosis not present

## 2022-11-22 DIAGNOSIS — I499 Cardiac arrhythmia, unspecified: Secondary | ICD-10-CM | POA: Diagnosis not present

## 2022-11-22 DIAGNOSIS — I10 Essential (primary) hypertension: Secondary | ICD-10-CM | POA: Diagnosis not present

## 2022-11-22 NOTE — Patient Instructions (Signed)
Medication Instructions:   Your physician recommends that you continue on your current medications as directed. Please refer to the Current Medication list given to you today.  *If you need a refill on your cardiac medications before your next appointment, please call your pharmacy*   Lab Work:  None Ordered  If you have labs (blood work) drawn today and your tests are completely normal, you will receive your results only by: [place] (if you have MyChart) OR A paper copy in the mail If you have any lab test that is abnormal or we need to change your treatment, we will call you to review the results.   Testing/Procedures:  Your physician has recommended that you wear a Zio monitor.   This monitor is a medical device that records the heart's electrical activity. Doctors most often use these monitors to diagnose arrhythmias. Arrhythmias are problems with the speed or rhythm of the heartbeat. The monitor is a small device applied to your chest. You can wear one while you do your normal daily activities. While wearing this monitor if you have any symptoms to push the button and record what you felt. Once you have worn this monitor for the period of time provider prescribed (Usually 14 days), you will return the monitor device in the postage paid box. Once it is returned they will download the data collected and provide [place] with a report which the provider will then review and we will call you with those results. Important tips:  Avoid showering during the first 24 hours of wearing the monitor. Avoid excessive sweating to help maximize wear time. Do not submerge the device, no hot tubs, and no swimming pools. Keep any lotions or oils away from the patch. After 24 hours you may shower with the patch on. Take brief showers with your back facing the shower head.  Do not remove patch once it has been placed because that will interrupt data and decrease adhesive wear time. Push the button  when you have any symptoms and write down what you were feeling. Once you have completed wearing your monitor, remove and place into box which has postage paid and place in your outgoing mailbox.  If for some reason you have misplaced your box then call our office and we can provide another box and/or mail it off for you.      Follow-Up: At [facility], you and your health needs are our priority.  As part of our continuing mission to provide you with exceptional heart care, we have created designated Provider Care Teams.  These Care Teams include your primary Cardiologist (physician) and Advanced Practice Providers (APPs -  Physician Assistants and Nurse Practitioners) who all work together to provide you with the care you need, when you need it.  We recommend signing up for the patient portal called "MyChart".  Sign up information is provided on this After Visit Summary.  MyChart is used to connect with patients for Virtual Visits (Telemedicine).  Patients are able to view lab/test results, encounter notes, upcoming appointments, etc.  Non-urgent messages can be sent to your provider as well.   To learn more about what you can do with MyChart, go to [email or web address].    Your next appointment:    After Zio  Provider:   You may see Kate Sable, MD or one of the following Advanced Practice Providers on your designated Care Team:   Murray Hodgkins, NP Christell Faith, PA-C Cadence Kathlen Mody, PA-C Gerrie Nordmann, NP

## 2022-11-22 NOTE — Progress Notes (Signed)
Cardiology Office Note:    Date:  11/22/2022   ID:  Annette Stevens, DOB 05/22/1946, MRN [id number]  PCP:  Teodora Medici, [place] [facility] Cardiologist:  Kate Sable, MD     Referring MD: Teodora Medici, DO   Chief Complaint  Patient presents with   New Patient (Initial Visit)    Irregular heartbeat, no other cardiac concerns, no Hx    History of Present Illness:    Annette Stevens is a 77 y.o. female with a hx of hypertension, hypothyroidism who presents due to irregular heartbeat.  Thyroid function tests obtained a month ago was normal.  Saw PCP for regular visit, irregular heartbeat noted on exam.  EKG from PCP 10/11/2022 showed sinus rhythm with PACs.  Symptoms of palpitations" about 2 days weekly.  Denies dizziness, syncope, chest pain or shortness of breath.  Has tried several blood pressure medications, does not tolerate any pills will not want to try any new meds.  Past Medical History:  Diagnosis Date   Hypertension    Thyroid disease     Past Surgical History:  Procedure Laterality Date   ABDOMINAL HYSTERECTOMY  1980   partial still has ovaries   BUNIONECTOMY Left 2006   ETHMOIDECTOMY Left 02/24/2018   Procedure: ETHMOIDECTOMY;  Surgeon: Clyde Canterbury, MD;  Location: [facility];  Service: ENT;  Laterality: Left;   FRONTAL SINUS EXPLORATION Left 02/24/2018   Procedure: FRONTAL SINUS EXPLORATION;  Surgeon: Clyde Canterbury, MD;  Location: [facility];  Service: ENT;  Laterality: Left;   IMAGE GUIDED SINUS SURGERY N/A 02/24/2018   Procedure: IMAGE GUIDED SINUS SURGERY;  Surgeon: Clyde Canterbury, MD;  Location: [facility];  Service: ENT;  Laterality: N/A;  GAVE DISK BRENDA 3-21 KP   MAXILLARY ANTROSTOMY Left 02/24/2018   Procedure: ENDSOCPIC MAXILLARY ANTROSTOMY TOTAL;  Surgeon: Clyde Canterbury, MD;  Location: [facility];  Service: ENT;  Laterality: Left;   SPHENOIDECTOMY Left 02/24/2018   Procedure: Coralee Pesa;   Surgeon: Clyde Canterbury, MD;  Location: [facility];  Service: ENT;  Laterality: Left;   TUBAL LIGATION  1969    Current Medications: Current Meds  Medication Sig   Biotin 10000 MCG TABS Take 1 tablet by mouth daily. JUST on four days of the week   Blood Pressure Monitoring (BLOOD PRESSURE CUFF) MISC 1 each by Does not apply route daily.   cholecalciferol (VITAMIN D3) 25 MCG (1000 UNIT) tablet Take 1,000 Units by mouth daily.   levothyroxine (EUTHYROX) 125 MCG tablet Take 1 tablet (125 mcg total) by mouth daily.   Turmeric 500 MG TABS Take 1,600 mg by mouth daily.     Allergies:   Ace inhibitors, Beta adrenergic blockers, and Other   Social History   Socioeconomic History   Marital status: Widowed    Spouse name: Not on file   Number of children: 2   Years of education: Not on file   Highest education level: High school graduate  Occupational History   Not on file  Tobacco Use   Smoking status: Former    Types: Cigarettes    Quit date: 1995    Years since quitting: 29.1   Smokeless tobacco: Never  Vaping Use   Vaping Use: Never used  Substance and Sexual Activity   Alcohol use: Yes    Alcohol/week: 1.0 standard drink of alcohol    Types: 1 Glasses of wine per week    Comment: occasional   Drug use: No   Sexual  activity: Not on file  Other Topics Concern   Not on file  Social History Narrative   Not on file   Social Determinants of Health   Financial Resource Strain: Low Risk  (12/31/2018)   Overall Financial Resource Strain (CARDIA)    Difficulty of Paying Living Expenses: Not hard at all  Food Insecurity: No Food Insecurity (12/31/2018)   Hunger Vital Sign    Worried About Running Out of Food in the Last Year: Never true    Ran Out of Food in the Last Year: Never true  Transportation Needs: No Transportation Needs (12/31/2018)   PRAPARE - Hydrologist (Medical): No    Lack of Transportation (Non-Medical): No  Physical  Activity: Inactive (12/31/2018)   Exercise Vital Sign    Days of Exercise per Week: 0 days    Minutes of Exercise per Session: 0 min  Stress: No Stress Concern Present (12/31/2018)   [place]    Feeling of Stress : Only a little  Social Connections: Unknown (12/31/2018)   Social Connection and Isolation Panel [NHANES]    Frequency of Communication with Friends and Family: Patient refused    Frequency of Social Gatherings with Friends and Family: Patient refused    Attends Religious Services: Patient refused    Marine scientist or Organizations: Patient refused    Attends Music therapist: Patient refused    Marital Status: Patient refused     Family History: The patient's family history includes Cancer in her mother; Dementia in her mother; Hypertension in her father; Lymphoma in her father.  ROS:   Please see the history of present illness.     All other systems reviewed and are negative.  EKGs/Labs/Other Studies Reviewed:    The following studies were reviewed today:   EKG:  EKG is  ordered today.  The ekg ordered today demonstrates normal sinus rhythm heart rate 79  Recent Labs: 10/11/2022: ALT 8; BUN 12; Creat 0.56; Hemoglobin 14.3; Platelets 331; Potassium 3.0; Sodium 144; TSH 0.90  Recent Lipid Panel    Component Value Date/Time   CHOL 214 (H) 12/31/2018 1035   TRIG 112 12/31/2018 1035   HDL 86 12/31/2018 1035   CHOLHDL 2.5 12/31/2018 1035   LDLCALC 107 (H) 12/31/2018 1035     Risk Assessment/Calculations:          Physical Exam:    VS:  BP (!) 172/102 (BP Location: Right Arm)   Pulse 79   Ht '5\' 2"'$  (1.575 m)   Wt 124 lb 12.8 oz (56.6 kg)   SpO2 98%   BMI 22.83 kg/m     Wt Readings from Last 3 Encounters:  11/22/22 124 lb 12.8 oz (56.6 kg)  10/11/22 124 lb 8 oz (56.5 kg)  08/19/22 122 lb 14.4 oz (55.7 kg)     GEN:  Well nourished, well developed in no acute  distress HEENT: Normal NECK: No JVD; No carotid bruits CARDIAC: RRR, no murmurs, rubs, gallops RESPIRATORY:  Clear to auscultation without rales, wheezing or rhonchi  ABDOMEN: Soft, non-tender, non-distended MUSCULOSKELETAL:  No edema; No deformity  SKIN: Warm and dry NEUROLOGIC:  Alert and oriented x 3 PSYCHIATRIC:  Normal affect   ASSESSMENT:    1. Irregular heart beat   2. Primary hypertension   3. Palpitations    PLAN:    In order of problems listed above:  Irregular heartbeat, EKG from PCP office showed  frequent PACs.  EKG today showing sinus rhythm.  Place cardiac monitor to evaluate any significant arrhythmias. Hypertension, BP elevated.  Patient declined starting any new medications.  Low-salt diet advised.  Follow-up after cardiac monitor, or as needed if cardiac monitor showed no significant abnormalities.      Medication Adjustments/Labs and Tests Ordered: Current medicines are reviewed at length with the patient today.  Concerns regarding medicines are outlined above.  Orders Placed This Encounter  Procedures   LONG TERM MONITOR (3-14 DAYS)   EKG 12-Lead   No orders of the defined types were placed in this encounter.   Patient Instructions  Medication Instructions:   Your physician recommends that you continue on your current medications as directed. Please refer to the Current Medication list given to you today.  *If you need a refill on your cardiac medications before your next appointment, please call your pharmacy*   Lab Work:  None Ordered  If you have labs (blood work) drawn today and your tests are completely normal, you will receive your results only by: [place] (if you have MyChart) OR A paper copy in the mail If you have any lab test that is abnormal or we need to change your treatment, we will call you to review the results.   Testing/Procedures:  Your physician has recommended that you wear a Zio monitor.   This monitor is a  medical device that records the heart's electrical activity. Doctors most often use these monitors to diagnose arrhythmias. Arrhythmias are problems with the speed or rhythm of the heartbeat. The monitor is a small device applied to your chest. You can wear one while you do your normal daily activities. While wearing this monitor if you have any symptoms to push the button and record what you felt. Once you have worn this monitor for the period of time provider prescribed (Usually 14 days), you will return the monitor device in the postage paid box. Once it is returned they will download the data collected and provide [place] with a report which the provider will then review and we will call you with those results. Important tips:  Avoid showering during the first 24 hours of wearing the monitor. Avoid excessive sweating to help maximize wear time. Do not submerge the device, no hot tubs, and no swimming pools. Keep any lotions or oils away from the patch. After 24 hours you may shower with the patch on. Take brief showers with your back facing the shower head.  Do not remove patch once it has been placed because that will interrupt data and decrease adhesive wear time. Push the button when you have any symptoms and write down what you were feeling. Once you have completed wearing your monitor, remove and place into box which has postage paid and place in your outgoing mailbox.  If for some reason you have misplaced your box then call our office and we can provide another box and/or mail it off for you.      Follow-Up: At [facility], you and your health needs are our priority.  As part of our continuing mission to provide you with exceptional heart care, we have created designated Provider Care Teams.  These Care Teams include your primary Cardiologist (physician) and Advanced Practice Providers (APPs -  Physician Assistants and Nurse Practitioners) who all work together to provide you with  the care you need, when you need it.  We recommend signing up for the patient portal called "MyChart".  Sign up information is provided on this After Visit Summary.  MyChart is used to connect with patients for Virtual Visits (Telemedicine).  Patients are able to view lab/test results, encounter notes, upcoming appointments, etc.  Non-urgent messages can be sent to your provider as well.   To learn more about what you can do with MyChart, go to [email or web address].    Your next appointment:    After Zio  Provider:   You may see Kate Sable, MD or one of the following Advanced Practice Providers on your designated Care Team:   Murray Hodgkins, NP Christell Faith, PA-C Cadence Kathlen Mody, PA-C Gerrie Nordmann, NP    Signed, Kate Sable, MD  11/22/2022 12:07 PM    [place]

## 2022-12-02 ENCOUNTER — Ambulatory Visit: Payer: Medicare Other | Admitting: Obstetrics and Gynecology

## 2022-12-09 ENCOUNTER — Encounter: Payer: Self-pay | Admitting: *Deleted

## 2022-12-12 DIAGNOSIS — R002 Palpitations: Secondary | ICD-10-CM

## 2022-12-12 DIAGNOSIS — I499 Cardiac arrhythmia, unspecified: Secondary | ICD-10-CM | POA: Diagnosis not present

## 2022-12-17 DIAGNOSIS — R002 Palpitations: Secondary | ICD-10-CM | POA: Diagnosis not present

## 2022-12-17 DIAGNOSIS — I499 Cardiac arrhythmia, unspecified: Secondary | ICD-10-CM | POA: Diagnosis not present

## 2022-12-18 ENCOUNTER — Ambulatory Visit: Payer: Medicare Other | Attending: Nurse Practitioner | Admitting: Nurse Practitioner

## 2022-12-18 ENCOUNTER — Encounter: Payer: Self-pay | Admitting: Nurse Practitioner

## 2022-12-18 VITALS — BP 162/92 | HR 95 | Ht 62.0 in | Wt 123.6 lb

## 2022-12-18 DIAGNOSIS — E876 Hypokalemia: Secondary | ICD-10-CM | POA: Diagnosis not present

## 2022-12-18 DIAGNOSIS — I471 Supraventricular tachycardia, unspecified: Secondary | ICD-10-CM

## 2022-12-18 DIAGNOSIS — I499 Cardiac arrhythmia, unspecified: Secondary | ICD-10-CM | POA: Diagnosis not present

## 2022-12-18 DIAGNOSIS — I10 Essential (primary) hypertension: Secondary | ICD-10-CM

## 2022-12-18 MED ORDER — DILTIAZEM HCL ER COATED BEADS 120 MG PO CP24: 120.0000 mg | ORAL_CAPSULE | Freq: Every day | ORAL | 3 refills | Status: DC

## 2022-12-18 NOTE — Patient Instructions (Signed)
Medication Instructions:   I have printed a prescription for Cardizem CD '120mg'$  - take one tablet by mouth daily.   *If you need a refill on your cardiac medications before your next appointment, please call your pharmacy*   Lab Work:  I have printed lab orders for you to have a BMP completed.   If you have labs (blood work) drawn today and your tests are completely normal, you will receive your results only by: [place] (if you have MyChart) OR A paper copy in the mail If you have any lab test that is abnormal or we need to change your treatment, we will call you to review the results.   Testing/Procedures:  None ordered    Follow-Up: At [facility], you and your health needs are our priority.  As part of our continuing mission to provide you with exceptional heart care, we have created designated Provider Care Teams.  These Care Teams include your primary Cardiologist (physician) and Advanced Practice Providers (APPs -  Physician Assistants and Nurse Practitioners) who all work together to provide you with the care you need, when you need it.  We recommend signing up for the patient portal called "MyChart".  Sign up information is provided on this After Visit Summary.  MyChart is used to connect with patients for Virtual Visits (Telemedicine).  Patients are able to view lab/test results, encounter notes, upcoming appointments, etc.  Non-urgent messages can be sent to your provider as well.   To learn more about what you can do with MyChart, go to [email or web address].    Your next appointment:   6 month(s)  Provider:   Kate Sable, MD

## 2022-12-18 NOTE — Progress Notes (Addendum)
Office Visit    Patient Name: Zayliana Parady Date of Encounter: 12/18/2022  Primary Care Provider:  Margarita Mail, DO Primary Cardiologist:  Debbe Odea, MD  Chief Complaint    77 y/o ? with a h/o HTN and hypothyroidism, who presents for f/u related to irregular heart rhythm.  Past Medical History    Past Medical History:  Diagnosis Date   Hypertension    Hypokalemia    Hypothyroidism    Premature atrial contractions    PSVT (paroxysmal supraventricular tachycardia)    a. 11/2022 Zio: Predominantly sinus rhythm (average 80, range 52-182).  3 SVT runs-fastest 4 beats at 182 bpm, longest 17.9 seconds at 148 bpm.  Rare isolated PACs and PVCs.   Past Surgical History:  Procedure Laterality Date   ABDOMINAL HYSTERECTOMY  1980   partial still has ovaries   BUNIONECTOMY Left 2006   ETHMOIDECTOMY Left 02/24/2018   Procedure: ETHMOIDECTOMY;  Surgeon: Geanie Logan, MD;  Location: [facility];  Service: ENT;  Laterality: Left;   FRONTAL SINUS EXPLORATION Left 02/24/2018   Procedure: FRONTAL SINUS EXPLORATION;  Surgeon: Geanie Logan, MD;  Location: [facility];  Service: ENT;  Laterality: Left;   IMAGE GUIDED SINUS SURGERY N/A 02/24/2018   Procedure: IMAGE GUIDED SINUS SURGERY;  Surgeon: Geanie Logan, MD;  Location: [facility];  Service: ENT;  Laterality: N/A;  GAVE DISK BRENDA 3-21 KP   MAXILLARY ANTROSTOMY Left 02/24/2018   Procedure: ENDSOCPIC MAXILLARY ANTROSTOMY TOTAL;  Surgeon: Geanie Logan, MD;  Location: [facility];  Service: ENT;  Laterality: Left;   SPHENOIDECTOMY Left 02/24/2018   Procedure: Selina Cooley;  Surgeon: Geanie Logan, MD;  Location: [facility];  Service: ENT;  Laterality: Left;   TUBAL LIGATION  1969   Allergies  Allergies  Allergen Reactions   Ace Inhibitors     Other reaction(s): Headache Blood pressure medications; Headaches, leg cramps, etc.   Beta Adrenergic Blockers Other (See Comments)    Blood  pressure medications; Headaches, leg cramps, etc.   Other Other (See Comments)    Blood pressure medications    History of Present Illness    77 year old female with a history of hypertension and hypothyroidism.  She has multiple drug intolerances, which has limited ability to manage blood pressure historically.  In December 2023, she noted irregular heartbeats and was noted to have a PAC on twelve-lead ECG.  Patient thinks that the symptoms along with an elevated blood pressure started following a steroid injection in her right shoulder.  She was also noted to be irregular on PCP examination.  Labs were obtained and showed a potassium of 3.0 and she was encouraged to increase her dietary intake of potassium.  She has done this to the best of her ability although notes ongoing issues with diarrhea, which she is aware is likely contributing to hypokalemia.  She was previously seen by GI related to diarrhea.  She was seen by Dr. Azucena Cecil in early February due to history of irregular heartbeat.  She was noted to be hypertensive that day.  A ZIO monitor was placed and showed predominantly sinus rhythm with an average rate of 80 bpm with 3 brief runs of SVT, the fastest up to 182 bpm x 4 beats with the longest lasting 17.9 seconds at 148 bpm.  Rare isolated PACs and PVCs were also noted.  No triggered events were recorded, the miss Beevers indicates today that she is pretty sure she felt some irregularity during the monitoring period.  Today, she denies any palpitations.  She continues to have intermittent diarrhea and is concerned about her hypokalemia, and its potential impact on her blood pressure, which is elevated today.  She is not able to check her blood pressure at home due to difficulty managing her cuff by herself.  Activity is limited by chronic pain however, she is able to perform yoga for about an hour once a week, and also does some stretching/yoga type activities the other days of the  week.  Home Medications    Current Outpatient Medications  Medication Sig Dispense Refill   Biotin 10000 MCG TABS Take 1 tablet by mouth daily. JUST on four days of the week 30 tablet    Blood Pressure Monitoring (BLOOD PRESSURE CUFF) MISC 1 each by Does not apply route daily. 1 each 0   cholecalciferol (VITAMIN D3) 25 MCG (1000 UNIT) tablet Take 1,000 Units by mouth daily.     diltiazem (CARDIZEM CD) 120 MG 24 hr capsule Take 1 capsule (120 mg total) by mouth daily. 90 capsule 3   levothyroxine (EUTHYROX) 125 MCG tablet Take 1 tablet (125 mcg total) by mouth daily. 90 tablet 1   Turmeric 500 MG TABS Take 1,600 mg by mouth daily. (Patient not taking: Reported on 12/18/2022)     No current facility-administered medications for this visit.     Review of Systems    Chronic pain impacting multiple joints in her back.  No recent irregular heartbeats.  All other systems reviewed and are otherwise negative except as noted above.    Physical Exam    VS:  BP (!) 162/92   Pulse 95   Ht '5\' 2"'$  (1.575 m)   Wt 123 lb 9.6 oz (56.1 kg)   SpO2 98%   BMI 22.61 kg/m  , BMI Body mass index is 22.61 kg/m.     Vitals:   12/18/22 1023 12/18/22 1304  BP: (!) 162/98 (!) 162/92  Pulse: 95   SpO2: 98%     GEN: Well nourished, well developed, in no acute distress. HEENT: normal. Neck: Supple, no JVD, carotid bruits, or masses. Cardiac: RRR, no murmurs, rubs, or gallops. No clubbing, cyanosis, edema.  Radials 2+/PT 2+ and equal bilaterally.  Respiratory:  Respirations regular and unlabored, clear to auscultation bilaterally. GI: Soft, nontender, nondistended, BS + x 4. MS: no obvious deformity or atrophy. Skin: warm and dry, no rash. Neuro:  Strength and sensation are intact. Psych: Normal affect.  Accessory Clinical Findings     Lab Results  Component Value Date   WBC 6.9 10/11/2022   HGB 14.3 10/11/2022   HCT 43.3 10/11/2022   MCV 84.7 10/11/2022   PLT 331 10/11/2022   Lab Results   Component Value Date   CREATININE 0.56 (L) 10/11/2022   BUN 12 10/11/2022   NA 144 10/11/2022   K 3.0 (L) 10/11/2022   CL 103 10/11/2022   CO2 32 10/11/2022   Lab Results  Component Value Date   ALT 8 10/11/2022   AST 14 10/11/2022   ALKPHOS 98 08/01/2022   BILITOT 0.4 10/11/2022   Lab Results  Component Value Date   CHOL 214 (H) 12/31/2018   HDL 86 12/31/2018   LDLCALC 107 (H) 12/31/2018   TRIG 112 12/31/2018   CHOLHDL 2.5 12/31/2018     Assessment & Plan    1.  Irregular heart rhythm/PACs/PSVT: Patient noted to have irregularity on examination in December 2023 in the setting of hypokalemia with a potassium of 3.0.  EKG at that time showed a PAC.  She has since undergone 14-day ZIO monitoring which shows rare PACs/PVCs, and 3 brief runs of PSVT.  There were no triggered events although, Ms. Paulis indicates that she is pretty sure she felt at least one of the episodes captured on the monitor.  She does not experience presyncope, syncope, chest pain, or dyspnea in the setting of irregular heart rhythm.  In light of Zio findings and ongoing elevated blood pressures, we discussed options for management.  Unfortunately, she does not tolerate beta-blockers.  We agreed to consider adding diltiazem CD 120 mg daily.  A prescription was provided today.  She would like to read up on this some more, as she is aware that she had intolerance to other blood pressure medications years ago but does not remember if this might have been one of them.  2.  Hypokalemia: Potassium was 3.0 in December, at which time she was noted to have irregular heart rhythm.  She was told at that time to increase her dietary intake of potassium and she has done her best to do this.  She wonders if she should be on a supplement.  She has not had repeat labs since December.  We have printed off a prescription for her to have a basic metabolic panel at her convenience, as [place] mall [place] it done  would not be convenient for her.  Pending that result, we can determine whether or not supplementation is appropriate.  3.  Essential hypertension: Long history of hypertension with multiple drug intolerances.  As above, she will consider starting diltiazem CD1 120 mg daily, and prescription has been provided.  4.  Disposition: Follow-up basic metabolic panel.  Follow-up in clinic in 6 months or sooner if necessary.   Murray Hodgkins, NP 12/18/2022, 1:16 PM

## 2022-12-19 ENCOUNTER — Other Ambulatory Visit: Payer: Self-pay

## 2022-12-19 ENCOUNTER — Telehealth: Payer: Self-pay | Admitting: Cardiology

## 2022-12-19 MED ORDER — DILTIAZEM HCL ER COATED BEADS 120 MG PO CP24: 120.0000 mg | ORAL_CAPSULE | Freq: Every day | ORAL | 3 refills | Status: DC

## 2022-12-19 NOTE — Telephone Encounter (Signed)
Pt c/o medication issue:  1. Name of Medication:   diltiazem (CARDIZEM CD) 120 MG 24 hr capsule   2. How are you currently taking this medication (dosage and times per day)? No taking yet  3. Are you having a reaction (difficulty breathing--STAT)?   N/A  4. What is your medication issue?   Patient stated she will only need a prescription for 30 days not 90 days of this medication as she is just trying it out.  Patient also stated that her age is incorrect in her visit summary and needs to be changed to "77 year old" in "History of Present Illness" section.

## 2023-02-05 ENCOUNTER — Telehealth: Payer: Self-pay | Admitting: Cardiology

## 2023-02-05 NOTE — Telephone Encounter (Signed)
Pt c/o medication issue:  1. Name of Medication:   diltiazem (CARDIZEM CD) 120 MG 24 hr capsule    2. How are you currently taking this medication (dosage and times per day)?    3. Are you having a reaction (difficulty breathing--STAT)? no  4. What is your medication issue? This medication gives the patient an bad reaction to it. Please advise

## 2023-02-05 NOTE — Telephone Encounter (Addendum)
I spoke with the pt and she was very upset that it was noted in her chart that she cannot tolerate Calcium channel blockers so she is not taking the Diltiazem and she is tired of talking to incompetent people.   She is currently not taking any BP meds and she does not know what her BP is since her cuff is not working at home.   She also says that we never check her BP right and she is also upset that we cannot do our jobs right.   Her BP at her 11/22/22 OV was 170/102 and her last OV 12/18/22 was 162/92.   She ssays she cannot take nay BP meds.   I advised her after her 20 min call that I will forward her note to Ward Givens NP and/or Dr. Sandie Ano for their review and any new recommendations.    *pt did not specific reasons for no Ca channel blockers but she knows she cannot take them and she is not willing to tell a "4th person".  (Added to her sensitivity list)

## 2023-02-12 ENCOUNTER — Other Ambulatory Visit
Admission: RE | Admit: 2023-02-12 | Discharge: 2023-02-12 | Disposition: A | Discharge status: Home / Self Care | Payer: Medicare Other | Attending: Nurse Practitioner | Admitting: Nurse Practitioner

## 2023-02-12 DIAGNOSIS — I10 Essential (primary) hypertension: Secondary | ICD-10-CM | POA: Diagnosis not present

## 2023-02-12 LAB — BASIC METABOLIC PANEL
Anion gap: 10 (ref 5–15)
BUN: 18 mg/dL (ref 8–23)
CO2: 27 mmol/L (ref 22–32)
Calcium: 9.3 mg/dL (ref 8.9–10.3)
Chloride: 102 mmol/L (ref 98–111)
Creatinine, Ser: 0.64 mg/dL (ref 0.44–1.00)
GFR, Estimated: >60 mL/min (ref >60)
Glucose, Bld: 127 mg/dL — ABNORMAL HIGH (ref 70–99)
Potassium: 3.8 mmol/L (ref 3.5–5.1)
Sodium: 139 mmol/L (ref 135–145)

## 2023-02-18 ENCOUNTER — Ambulatory Visit: Payer: Medicare Other

## 2023-02-18 ENCOUNTER — Ambulatory Visit (INDEPENDENT_AMBULATORY_CARE_PROVIDER_SITE_OTHER): Payer: Medicare Other | Admitting: Obstetrics and Gynecology

## 2023-02-18 ENCOUNTER — Encounter: Payer: Self-pay | Admitting: Obstetrics and Gynecology

## 2023-02-18 ENCOUNTER — Other Ambulatory Visit (HOSPITAL_COMMUNITY)
Admission: RE | Admit: 2023-02-18 | Discharge: 2023-02-18 | Disposition: A | Discharge status: Home / Self Care | Payer: Medicare Other | Source: Other Acute Inpatient Hospital | Attending: Obstetrics and Gynecology | Admitting: Obstetrics and Gynecology

## 2023-02-18 VITALS — BP 196/116 | HR 96 | Ht 61.2 in | Wt 127.6 lb

## 2023-02-18 DIAGNOSIS — N8110 Cystocele, unspecified: Secondary | ICD-10-CM

## 2023-02-18 DIAGNOSIS — N393 Stress incontinence (female) (male): Secondary | ICD-10-CM | POA: Diagnosis not present

## 2023-02-18 DIAGNOSIS — R319 Hematuria, unspecified: Secondary | ICD-10-CM | POA: Diagnosis not present

## 2023-02-18 DIAGNOSIS — R350 Frequency of micturition: Secondary | ICD-10-CM

## 2023-02-18 DIAGNOSIS — N993 Prolapse of vaginal vault after hysterectomy: Secondary | ICD-10-CM

## 2023-02-18 DIAGNOSIS — R82998 Other abnormal findings in urine: Secondary | ICD-10-CM

## 2023-02-18 DIAGNOSIS — N816 Rectocele: Secondary | ICD-10-CM

## 2023-02-18 LAB — POCT URINALYSIS DIPSTICK
Bilirubin, UA: NEGATIVE
Glucose, UA: NEGATIVE
Ketones, UA: NEGATIVE
Nitrite, UA: NEGATIVE
Protein, UA: NEGATIVE
Spec Grav, UA: 1.015 (ref 1.010–1.025)
Urobilinogen, UA: 0.2 E.U./dL
pH, UA: 6 (ref 5.0–8.0)

## 2023-02-18 LAB — URINALYSIS, ROUTINE W REFLEX MICROSCOPIC
Bilirubin Urine: NEGATIVE
Glucose, UA: NEGATIVE mg/dL
Ketones, ur: NEGATIVE mg/dL
Nitrite: NEGATIVE
Protein, ur: NEGATIVE mg/dL
Specific Gravity, Urine: 1.006 (ref 1.005–1.030)
pH: 6 (ref 5.0–8.0)

## 2023-02-18 NOTE — Progress Notes (Signed)
New patient

## 2023-02-18 NOTE — Patient Instructions (Signed)
You have a stage 4 (out of 4) prolapse.  We discussed the fact that it is not life threatening but there are several treatment options. For treatment of pelvic organ prolapse, we discussed options for management including expectant management, conservative management, and surgical management, such as Kegels, a pessary, pelvic floor physical therapy, and specific surgical procedures (colpocleisis or vaginal closure).   If you decide you want surgery: Will need blood pressure decreased and clearance from a cardiologist. If you want a different cardiologist, let us know who you want to see and we can place a referral.  You will need urodynamic testing which is done in the office and will give [place] information about bladder emptying and leakage.   In the meantime:  Go to [employer] PAP connect and apply for an estring vaginal estrogen ring  Will have you return to try another pessary.

## 2023-02-18 NOTE — Progress Notes (Addendum)
[facility] New Patient Evaluation and Consultation  Referring Provider: Margarita Mail, DO PCP: Margarita Mail, DO Date of Service: 02/18/2023  SUBJECTIVE Chief Complaint: New Patient (Initial Visit) Marland KitchenJackelin Stevens is a 77 y.o. female is here for vaginal prolapse.)  History of Present Illness: Annette Stevens is a 77 y.o. White or Caucasian female seen in consultation at the request of Dr. Jean Rosenthal  for evaluation of vaginal erosion from pessary use.    Review of records significant for: History of pelvic organ prolapse, previously used a Gehrung #3 pessary with no vaginal estrogen cream use. Reports she was unable to use vaginal estrogen   Urinary Symptoms: Leaks urine with laughing and with a full bladder Leaks 2-3 time(s) per days.  Pad use: 1 pads per day.   She is bothered by her UI symptoms.  Day time voids 5-6.  Nocturia: 0-1 times per night to void. Voiding dysfunction: she does not empty her bladder well.  does not use a catheter to empty bladder.  When urinating, she feels difficulty starting urine stream and the need to urinate multiple times in a row Drinks: 1.5 cups coffee, 8-16oz Water, V8 Low sodium, Green Iced Tea, 6oz Sparkling water per day  UTIs: 0 UTI's in the last year.   Denies history of blood in urine, kidney or bladder stones, pyelonephritis, bladder cancer, and kidney cancer  Pelvic Organ Prolapse Symptoms:                  She Admits to a feeling of a bulge the vaginal area. It has been present for 11 years.  She Admits to seeing a bulge.  This bulge is bothersome.  Bowel Symptom: Bowel movements: 1-2 time(s) per day Stool consistency: loose Straining: no.  Splinting: no.  Incomplete evacuation: no.  She Admits to accidental bowel leakage / fecal incontinence  Occurs: 3 time(s) per year  Consistency with leakage: loose or liquid Bowel regimen: none Last colonoscopy: Never  Sexual Function Sexually active: no.  Sexual  orientation: Straight Pain with sex: No  Pelvic Pain Denies pelvic pain    Past Medical History:  Past Medical History:  Diagnosis Date   Hypertension    Hypokalemia    Hypothyroidism    Premature atrial contractions    PSVT (paroxysmal supraventricular tachycardia)    a. 11/2022 Zio: Predominantly sinus rhythm (average 80, range 52-182).  3 SVT runs-fastest 4 beats at 182 bpm, longest 17.9 seconds at 148 bpm.  Rare isolated PACs and PVCs.     Past Surgical History:   Past Surgical History:  Procedure Laterality Date   BUNIONECTOMY Left 2006   ETHMOIDECTOMY Left 02/24/2018   Procedure: ETHMOIDECTOMY;  Surgeon: Geanie Logan, MD;  Location: [facility];  Service: ENT;  Laterality: Left;   FRONTAL SINUS EXPLORATION Left 02/24/2018   Procedure: FRONTAL SINUS EXPLORATION;  Surgeon: Geanie Logan, MD;  Location: [facility];  Service: ENT;  Laterality: Left;   IMAGE GUIDED SINUS SURGERY N/A 02/24/2018   Procedure: IMAGE GUIDED SINUS SURGERY;  Surgeon: Geanie Logan, MD;  Location: [facility];  Service: ENT;  Laterality: N/A;  GAVE DISK BRENDA 3-21 KP   MAXILLARY ANTROSTOMY Left 02/24/2018   Procedure: ENDSOCPIC MAXILLARY ANTROSTOMY TOTAL;  Surgeon: Geanie Logan, MD;  Location: [facility];  Service: ENT;  Laterality: Left;   SPHENOIDECTOMY Left 02/24/2018   Procedure: Selina Cooley;  Surgeon: Geanie Logan, MD;  Location: [facility];  Service: ENT;  Laterality: Left;   TUBAL LIGATION  1969   VAGINAL HYSTERECTOMY  1980   partial still has ovaries     Past OB/GYN History: G3 P2 Vaginal deliveries: 2,  Forceps/ Vacuum deliveries: 0, Cesarean section: 0 S/p hysterectomy   Medications: She has a current medication list which includes the following prescription(s): cholecalciferol, levothyroxine, multivitamin, and turmeric.   Allergies: Patient is allergic to ace inhibitors, beta adrenergic blockers, calcium channel blockers, and other.    Social History:  Social History   Tobacco Use   Smoking status: Former    Types: Cigarettes    Quit date: 1995    Years since quitting: 29.3   Smokeless tobacco: Never  Vaping Use   Vaping Use: Never used  Substance Use Topics   Alcohol use: Yes    Alcohol/week: 1.0 standard drink of alcohol    Types: 1 Glasses of wine per week    Comment: occasional   Drug use: No    Relationship status: widowed She lives alone.   She is not employed. Regular exercise: 6-7 times per week, takes yoga class History of abuse: No  Family History:   Family History  Problem Relation Age of Onset   Dementia Mother    Cancer Mother        colon   Lymphoma Father    Hypertension Father    Non-Hodgkin's lymphoma Father      Review of Systems: Review of Systems  Constitutional:  Positive for malaise/fatigue. Negative for fever and weight loss.  Respiratory:  Negative for cough, shortness of breath and wheezing.   Cardiovascular:  Negative for chest pain, palpitations and leg swelling.  Gastrointestinal:  Positive for diarrhea. Negative for abdominal pain, blood in stool and constipation.  Genitourinary:  Positive for frequency.  Skin:  Negative for rash.  Neurological:  Positive for weakness. Negative for dizziness and headaches.  Endo/Heme/Allergies:  Does not bruise/bleed easily.  Psychiatric/Behavioral:  Negative for depression and suicidal ideas. The patient is not nervous/anxious.      OBJECTIVE Physical Exam: Vitals:   02/18/23 1315 02/18/23 1338  BP: (!) 194/103 (!) 196/116  Pulse: (!) 108 96  Weight: 127 lb 9.6 oz (57.9 kg)   Height: 5' 1.2" (1.554 m)     Physical Exam Constitutional:      General: She is not in acute distress. Pulmonary:     Effort: Pulmonary effort is normal.  Abdominal:     General: There is no distension.     Palpations: Abdomen is soft.     Tenderness: There is no abdominal tenderness. There is no rebound.  Musculoskeletal:        General:  No swelling. Normal range of motion.  Skin:    General: Skin is warm and dry.     Findings: No rash.  Neurological:     Mental Status: She is alert and oriented to person, place, and time.  Psychiatric:        Mood and Affect: Mood normal.        Behavior: Behavior normal.      GU / Detailed Urogynecologic Evaluation:  Pelvic Exam: Normal external female genitalia; Bartholin's and Skene's glands normal in appearance; urethral meatus normal in appearance, no urethral masses or discharge.   CST: negative with and without prolapse reduced  Complete prolapse noted. s/p hysterectomy: Speculum exam reveals normal vaginal mucosa with  atrophy and normal vaginal cuff. Small amount of bleeding at the introitus but no visible ulcerations.  Adnexa no mass, fullness, tenderness.    Pelvic floor  strength I/V  Pelvic floor musculature: Right levator non-tender, Right obturator non-tender, Left levator non-tender, Left obturator non-tender  POP-Q:   POP-Q  3                                            Aa   5                                           Ba  5                                              C   6                                            Gh  3                                            Pb  7                                            tvl   -2                                            Ap  5                                            Bp                                                 D      Rectal Exam:  Normal external rectum  Post-Void Residual (PVR) by Bladder Scan: In order to evaluate bladder emptying, we discussed obtaining a postvoid residual and she agreed to this procedure.  Procedure: The ultrasound unit was placed on the patient's abdomen in the suprapubic region after the patient had voided. A PVR of 140 ml was obtained by bladder scan.  Laboratory Results: POC urine: small blood and leukocytes   ASSESSMENT AND PLAN Ms. Buckey is a 77 y.o. with:   1. Vaginal vault prolapse after hysterectomy   2. Prolapse of anterior vaginal wall   3. Prolapse of posterior vaginal wall   4. SUI (stress urinary incontinence, female)   5. Urinary frequency   6. Leukocytes in urine   7. Hematuria, unspecified type    Stage IV anterior, Stage IV posterior, Stage IV apical prolapse - For treatment of pelvic organ prolapse,  we discussed options for management including expectant management, conservative management, and surgical management, such as Kegels, a pessary, pelvic floor physical therapy, and specific surgical procedures. - She is considering surgery and since is is not sexually active, would recommend colpocleisis. However, we discussed she is not a good surgical candidate due to her uncontrolled high BP. She would need to see a cardiologist and obtain clearance as a low risk candidate prior to proceeding. She did not feel comfortable with the last cardiologist that she saw. We discussed that if she finds another one she is comfortable with then I can place a referral.  - She also has concerns about post-operative surgical restrictions as she does not have any family nearby and is not sure she could arrange for anyone to help her.  - Therefore, we discussed the option of another pessary, she will return for a fitting.  - Since she has had difficulty placing vaginal estrogen cream previously so we discussed that she could get an estring to place behind the pessary which would help prevent vaginal erosions. She does not have coverage for prescriptions, so information was given to her to enroll in the [employer] PAP program.   2. SUI - For treatment of stress urinary incontinence,  non-surgical options include expectant management, weight loss, physical therapy, as well as a pessary.  Surgical options include a midurethral sling, Burch urethropexy, and transurethral injection of a bulking agent. - If she wanted to pursue surgery, would need urodynamic testing.    3. Urinary urgency/ frequency - Will reassess after pessary, did not discuss treatment in detail today.   4. Leukocytes/ blood in urine - will send for micro UA and culture.    Return for pessary fitting.    Marguerita Beards, MD

## 2023-02-19 ENCOUNTER — Telehealth: Payer: Self-pay | Admitting: Nurse Practitioner

## 2023-02-19 ENCOUNTER — Telehealth: Payer: Self-pay | Admitting: Cardiology

## 2023-02-19 LAB — URINE CULTURE

## 2023-02-19 NOTE — Telephone Encounter (Signed)
Pt is requesting a callback from [place] in regards to RESPE Rate Device. Pt heard it was helpful, has been approved by FDA and used for breathing techniques and lowering BP quickly. Since pt stated she's has a reaction to all of her BP medications, she'd like to see if using this would be an option and a prescription isn't needed. If she doesn't answer when called back, she stated it's ok to leave the answer on her VM. Please advise

## 2023-02-19 NOTE — Telephone Encounter (Signed)
error 

## 2023-02-20 DIAGNOSIS — D485 Neoplasm of uncertain behavior of skin: Secondary | ICD-10-CM | POA: Diagnosis not present

## 2023-02-20 DIAGNOSIS — D239 Other benign neoplasm of skin, unspecified: Secondary | ICD-10-CM | POA: Diagnosis not present

## 2023-02-20 DIAGNOSIS — L821 Other seborrheic keratosis: Secondary | ICD-10-CM | POA: Diagnosis not present

## 2023-02-20 DIAGNOSIS — D225 Melanocytic nevi of trunk: Secondary | ICD-10-CM | POA: Diagnosis not present

## 2023-02-20 DIAGNOSIS — L02212 Cutaneous abscess of back [any part, except buttock]: Secondary | ICD-10-CM | POA: Diagnosis not present

## 2023-02-20 DIAGNOSIS — L570 Actinic keratosis: Secondary | ICD-10-CM | POA: Diagnosis not present

## 2023-02-20 NOTE — Telephone Encounter (Signed)
Called patient to go over Dr. Azucena Cecil recommendations.   Called patient. No answer. Left detailed message on VM - okay per DPR

## 2023-02-21 ENCOUNTER — Encounter: Payer: Self-pay | Admitting: Obstetrics and Gynecology

## 2023-03-19 ENCOUNTER — Ambulatory Visit (INDEPENDENT_AMBULATORY_CARE_PROVIDER_SITE_OTHER): Payer: Medicare Other | Admitting: Obstetrics and Gynecology

## 2023-03-19 ENCOUNTER — Encounter: Payer: Self-pay | Admitting: Obstetrics and Gynecology

## 2023-03-19 VITALS — BP 194/98 | HR 102

## 2023-03-19 DIAGNOSIS — N8110 Cystocele, unspecified: Secondary | ICD-10-CM

## 2023-03-19 DIAGNOSIS — N816 Rectocele: Secondary | ICD-10-CM

## 2023-03-19 DIAGNOSIS — I10 Essential (primary) hypertension: Secondary | ICD-10-CM

## 2023-03-19 DIAGNOSIS — N993 Prolapse of vaginal vault after hysterectomy: Secondary | ICD-10-CM | POA: Diagnosis not present

## 2023-03-19 DIAGNOSIS — M6289 Other specified disorders of muscle: Secondary | ICD-10-CM

## 2023-03-19 DIAGNOSIS — Z4689 Encounter for fitting and adjustment of other specified devices: Secondary | ICD-10-CM

## 2023-03-19 NOTE — Progress Notes (Signed)
[facility]   Subjective:     Chief Complaint: Pessary fitting [name] is a 77 y.o. female is here for pessary fitting.)  History of Present Illness: [name] is a 77 y.o. female with stage IV pelvic organ prolapse who presents today for a pessary fitting.    She reports she has had Gellhorn, Gehrung and Ring pessaries in the past without success.   Past Medical History: Patient  has a past medical history of Hypertension, Hypokalemia, Hypothyroidism, Premature atrial contractions, and PSVT (paroxysmal supraventricular tachycardia).   Past Surgical History: She  has a past surgical history that includes Bunionectomy (Left, 2006); Tubal ligation (1969); Image guided sinus surgery (N/A, 02/24/2018); Maxillary antrostomy (Left, 02/24/2018); Ethmoidectomy (Left, 02/24/2018); Frontal sinus exploration (Left, 02/24/2018); Sphenoidectomy (Left, 02/24/2018); and Vaginal hysterectomy (1980).   Medications: She has a current medication list which includes the following prescription(s): cholecalciferol, levothyroxine, multivitamin, and turmeric.   Allergies: Patient is allergic to ace inhibitors, beta adrenergic blockers, calcium channel blockers, and other.   Social History: Patient  reports that she quit smoking about 29 years ago. Her smoking use included cigarettes. She has never used smokeless tobacco. She reports that she does not currently use alcohol after a past usage of about 1.0 standard drink of alcohol per week. She reports that she does not use drugs.      Objective:    BP (!) 178/102   Pulse (!) 102  Gen: No apparent distress, A&O x 3. Pelvic Exam: Normal external female genitalia; Bartholin's and Skene's glands normal in appearance; urethral meatus with caruncle, no urethral masses or discharge.   Attempted a #3 cube pessary which was expelled with urination attempt  Attempted a #5 Cube pessary which fit and stayed in place.   A size # 5cube  pessary was fitted. It was comfortable, stayed in place with valsalva and was an appropriate size on examination, with one finger fitting between the pessary and the vaginal walls. We tied a string to it and the patient demonstrated proper removal and replacement.     Assessment/Plan:    Assessment: [name] is a 77 y.o. with stage IV pelvic organ prolapse who presents for a pessary fitting. Plan: She was fitted with a #5 cube pessary. She will keep the pessary in place until next visit. She will use lubricant.   Follow-up in 4 weeks for a pessary check or sooner as needed.   She would benefit significantly from vaginal estrogen but reports she cannot afford an Estring and has no prescription drug coverage. She reported being overwhelmed with the pfizer platform to get the Estring. Will encourage the use of lubrication for now. Sent estrogen cream to the pharmacy for patient in case she would like to pick it up.   Of note, patient's blood pressure was elevated x2 today. Referral placed for cardiology in [place] at patient's request.   Patient has a lot of right sided pelvic floor tension related to her pelvic pain and mobility. Pelvic floor PT referral placed.   All questions were answered.    Selmer Dominion, NP

## 2023-03-19 NOTE — Patient Instructions (Addendum)
Use coconut oil or vitamin e cream to give the vagina some lubrication. You can also do the Replens over the counter or lubrication.   Referral placed for [facility]

## 2023-03-20 MED ORDER — ESTRADIOL 0.1 MG/GM VA CREA: 0.5000 g | TOPICAL_CREAM | VAGINAL | 11 refills | Status: DC

## 2023-03-20 NOTE — Addendum Note (Signed)
Addended by: Selmer Dominion on: 03/20/2023 08:49 AM   Modules accepted: Orders

## 2023-03-21 ENCOUNTER — Telehealth: Payer: Self-pay | Admitting: Cardiology

## 2023-03-21 NOTE — Telephone Encounter (Signed)
Pt requesting provider switch to Dr. Okey Dupre. Please advise

## 2023-04-03 ENCOUNTER — Other Ambulatory Visit: Payer: Self-pay | Admitting: Internal Medicine

## 2023-04-03 DIAGNOSIS — E039 Hypothyroidism, unspecified: Secondary | ICD-10-CM

## 2023-04-03 NOTE — Telephone Encounter (Signed)
Requested Prescriptions  Pending Prescriptions Disp Refills   levothyroxine (SYNTHROID) 125 MCG tablet [Pharmacy Med Name: Levothyroxine Sodium 125 MCG Oral Tablet] 90 tablet 0    Sig: Take 1 tablet by mouth once daily     Endocrinology:  Hypothyroid Agents Passed - 04/03/2023  8:32 AM      Passed - TSH in normal range and within 360 days    TSH  Date Value Ref Range Status  10/11/2022 0.90 0.40 - 4.50 mIU/L Final         Passed - Valid encounter within last 12 months    Recent Outpatient Visits           5 months ago Irregular heartbeat   [facility] Margarita Mail, DO   7 months ago Hypothyroidism, unspecified type   [facility] Margarita Mail, DO   10 months ago Osteoarthritis of both knees, unspecified osteoarthritis type   [facility] Margarita Mail, DO   11 months ago Chronic diarrhea   [facility] Margarita Mail, DO   1 year ago Diarrhea, unspecified type   [facility] Margarita Mail, DO       Future Appointments             In 3 weeks Cordelia Pen, Joan Mayans, NP [facility] at [facility], [facility]   In 3 months End, Cristal Deer, MD [facility] at [facility]

## 2023-04-07 ENCOUNTER — Other Ambulatory Visit: Payer: Self-pay | Admitting: Internal Medicine

## 2023-04-10 ENCOUNTER — Emergency Department: Payer: Medicare Other

## 2023-04-10 ENCOUNTER — Other Ambulatory Visit: Payer: Self-pay

## 2023-04-10 ENCOUNTER — Encounter: Payer: Self-pay | Admitting: Emergency Medicine

## 2023-04-10 ENCOUNTER — Emergency Department
Admission: EM | Admit: 2023-04-10 | Discharge: 2023-04-10 | Disposition: A | Discharge status: Home / Self Care | Payer: Medicare Other | Attending: Emergency Medicine | Admitting: Emergency Medicine

## 2023-04-10 DIAGNOSIS — M25552 Pain in left hip: Secondary | ICD-10-CM | POA: Diagnosis not present

## 2023-04-10 DIAGNOSIS — R252 Cramp and spasm: Secondary | ICD-10-CM | POA: Diagnosis not present

## 2023-04-10 DIAGNOSIS — S72115A Nondisplaced fracture of greater trochanter of left femur, initial encounter for closed fracture: Secondary | ICD-10-CM | POA: Diagnosis not present

## 2023-04-10 DIAGNOSIS — S72112A Displaced fracture of greater trochanter of left femur, initial encounter for closed fracture: Secondary | ICD-10-CM | POA: Diagnosis not present

## 2023-04-10 DIAGNOSIS — X58XXXA Exposure to other specified factors, initial encounter: Secondary | ICD-10-CM | POA: Insufficient documentation

## 2023-04-10 DIAGNOSIS — M1612 Unilateral primary osteoarthritis, left hip: Secondary | ICD-10-CM | POA: Diagnosis not present

## 2023-04-10 DIAGNOSIS — I10 Essential (primary) hypertension: Secondary | ICD-10-CM | POA: Insufficient documentation

## 2023-04-10 DIAGNOSIS — S79922A Unspecified injury of left thigh, initial encounter: Secondary | ICD-10-CM | POA: Diagnosis present

## 2023-04-10 MED ORDER — HYDROCODONE-ACETAMINOPHEN 5-325 MG PO TABS
1.0000 | ORAL_TABLET | Freq: Once | ORAL | Status: AC
  Administered 2023-04-10: 1 via ORAL
  Filled 2023-04-10: qty 1

## 2023-04-10 MED ORDER — MELOXICAM 15 MG PO TABS: 15.0000 mg | ORAL_TABLET | Freq: Every day | ORAL | 2 refills | Status: DC

## 2023-04-10 NOTE — Discharge Instructions (Addendum)
Use your walker at all timed.  Call and schedule a follow up with orthopedics.  Return to the ER for symptoms that change, worsen, or for new concerns.

## 2023-04-10 NOTE — ED Triage Notes (Signed)
See first nurse note. 

## 2023-04-10 NOTE — ED Notes (Signed)
See triage note  Present with muscle pain to left groin area which radiates around to hip and upper leg  Increased pain with movement and standing

## 2023-04-10 NOTE — ED Notes (Signed)
First nurse note: From home [facility], to ED for "muscle tightness" to L anterior thigh since 2 days. No injury or strain. Pt did more yoga than usual this past week. Pt ambulatory. Pt refused CBG and vitals with EMS.

## 2023-04-10 NOTE — ED Provider Notes (Signed)
[facility] Provider Note    Event Date/Time   First MD Initiated Contact with Patient 04/10/23 [phone]     (approximate)   History   Muscle Pain   HPI  Annette Stevens is a 77 y.o. female  with history of hypertension, PSVT, hypokalemia and as listed in EMR presents to the emergency department for treatment and evaluation of nontraumatic injury to left thigh and left hip pain. Pain increases with attempt to ambulate.    Physical Exam   Triage Vital Signs: ED Triage Vitals  Enc Vitals Group     BP 04/10/23 0758 (!) 150/78     Pulse Rate 04/10/23 0758 93     Resp 04/10/23 0758 16     Temp 04/10/23 0758 98.2 F (36.8 C)     Temp Source 04/10/23 0758 Oral     SpO2 04/10/23 0758 96 %     Weight 04/10/23 0759 123 lb (55.8 kg)     Height 04/10/23 0805 5' 1.25" (1.556 m)     Head Circumference --      Peak Flow --      Pain Score 04/10/23 0759 3     Pain Loc --      Pain Edu? --      Excl. in GC? --     Most recent vital signs: Vitals:   04/10/23 0758 04/10/23 1159  BP: (!) 150/78 (!) 148/80  Pulse: 93 80  Resp: 16 16  Temp: 98.2 F (36.8 C) 98 F (36.7 C)  SpO2: 96% 96%    General: Awake, no distress.  CV:  Good peripheral perfusion.  Resp:  Normal effort.  Abd:  No distention.  Other:  No focal tenderness of the left hip/leg. No shortening or rotation.    ED Results / Procedures / Treatments   Labs (all labs ordered are listed, but only abnormal results are displayed) Labs Reviewed - No data to display   EKG  Not indicated.   RADIOLOGY  Image and radiology report reviewed and interpreted by me. Radiology report consistent with the same.  Left hip x -ray  There is marked deformity of head and neck of left femur suggesting  old ununited or malunited fracture. Neck of the left femur is  superior to the head. Severe degenerative changes are noted with  joint space narrowing, bony spurs and subcortical cysts. Left  acetabulum  is shallow suggesting longstanding process. No definite  recent fracture is seen.   Left hip CT: 1. Mildly displaced fracture of the greater trochanter.  2. Severe osteoarthritis of the left hip with bone-on-bone  articulation and osseous remodeling of the femoral head, likely  sequela of prior trauma.  3. Muscles and subcutaneous soft tissues are within normal limits.  4. Colonic diverticulosis without evidence of acute diverticulitis.     PROCEDURES:  Critical Care performed: No  Procedures   MEDICATIONS ORDERED IN ED:  Medications  HYDROcodone-acetaminophen (NORCO/VICODIN) 5-325 MG per tablet 1 tablet (1 tablet Oral Given 04/10/23 0959)     IMPRESSION / MDM / ASSESSMENT AND PLAN / ED COURSE   I have reviewed the triage note.  Differential diagnosis includes, but is not limited to, osteoarthritis, hip strain  Patient's presentation is most consistent with acute illness / injury with system symptoms.  77 year old presents for nontraumatic left lower extremity pain. X-rays show a healed hip fracture and severe osteoarthritis.   CT shows mildly displaced fracture of the greater trochanter and severe  osteoarthritis.  Consulted with Dr. Martha Clan who feels that the patient can be discharged if she is able to ambulate while using a walker.  He also recommends that she follow-up with Dr. Odis Luster for hip replacement.  Results and plan was discussed with the patient.  Patient able to ambulate with a walker.  She is concerned that she is not going to be able to get around at home.  She was offered rehab placement however ultimately she decided she wanted to try to go home first and if she was not able to manage she would return to the emergency department.      FINAL CLINICAL IMPRESSION(S) / ED DIAGNOSES   Final diagnoses:  Closed displaced fracture of greater trochanter of left femur, initial encounter (HCC)  Osteoarthritis of left hip, unspecified osteoarthritis type      Rx / DC Orders   ED Discharge Orders          Ordered    meloxicam (MOBIC) 15 MG tablet  Daily        04/10/23 1159             Note:  This document was prepared using Dragon voice recognition software and may include unintentional dictation errors.   Chinita Pester, FNP 04/11/23 1354    Sharman Cheek, MD 04/12/23 [phone]

## 2023-04-11 ENCOUNTER — Ambulatory Visit: Payer: Self-pay | Admitting: *Deleted

## 2023-04-11 NOTE — Telephone Encounter (Signed)
     Summary: Medication Advice   Pt is calling to report the in the past she has had an irregular hear beat caused by a drop in potassium. Can she take meloxicam (MOBIC) 15 MG tablet [[id number]]? Please Advice       Chief Complaint: medication question  Symptoms: went to ED for hip displacement 04/10/23 and given meloxicam 15 mg . Patient concerned to take due to low potassium in the past. Last potassium 02/12/23 was 3.8.  Frequency: na  Pertinent Negatives: Patient denies na  Disposition: [] ED /[] Urgent Care (no appt availability in office) / [] Appointment(In office/virtual)/ []  [place] [facility]/ [] Home Care/ [] Refused Recommended Disposition /[] [employer] Mobile Bus/ [x]  Follow-up with PCP Additional Notes:   Medication question and patient reports she has tried to call ED to speak with provider who prescribed meloxicam and can not get anyone. Please advise. Patient would like a call back.   Reason for Disposition  [1] Caller has medicine question about med NOT prescribed by PCP AND [2] triager unable to answer question (e.g., compatibility with other med, storage)  Answer Assessment - Initial Assessment Questions 1. NAME of MEDICINE: "What medicine(s) are you calling about?"     Meloxicam  2. QUESTION: "What is your question?" (e.g., double dose of medicine, side effect)     Is it safe to take meloxicam 15 mg , when patient has had low potassium in the past? 3. PRESCRIBER: "Who prescribed the medicine?" Reason: if prescribed by specialist, call should be referred to that group.     Last seen in ED by Edwena Bunde, MD / ED 4. SYMPTOMS: "Do you have any symptoms?" If Yes, ask: "What symptoms are you having?"  "How bad are the symptoms (e.g., mild, moderate, severe)     Hip pain will have hip surgery soon  5. PREGNANCY:  "Is there any chance that you are pregnant?" "When was your last menstrual period?"     na  Protocols used: Medication Question Call-A-AH

## 2023-04-15 ENCOUNTER — Ambulatory Visit: Payer: Medicare Other | Admitting: Obstetrics and Gynecology

## 2023-04-17 ENCOUNTER — Telehealth: Payer: Self-pay

## 2023-04-17 NOTE — Telephone Encounter (Signed)
Transition Care Management Unsuccessful Follow-up Telephone Call  Date of discharge and from where:  04/10/2023 [facility]  Attempts:  1st Attempt  Reason for unsuccessful TCM follow-up call:  No answer/busy  Annette Stevens [facility]  [facility] Population Health Community Resource Care Guide   [email or web address]  ?? [phone]   Website: [email or web address]  [email or web address]

## 2023-04-18 ENCOUNTER — Telehealth: Payer: Self-pay

## 2023-04-18 NOTE — Telephone Encounter (Signed)
Transition Care Management Unsuccessful Follow-up Telephone Call  Date of discharge and from where:  04/10/2023 [facility]  Attempts:  2nd Attempt  Reason for unsuccessful TCM follow-up call:  Left voice message  Sajid Ruppert [facility]  [facility] Population Health Community Resource Care Guide   [email or web address]  ?? [phone]   Website: [email or web address]  [email or web address]

## 2023-04-22 ENCOUNTER — Telehealth: Payer: Self-pay

## 2023-04-22 NOTE — Telephone Encounter (Signed)
Transition Care Management Follow-up Telephone Call Date of discharge and from where: 04/10/2023 [facility] How have you been since you were released from the hospital? Patient stated she is still having pain and difficulty walking. Any questions or concerns? No  Items Reviewed: Did the pt receive and understand the discharge instructions provided?  Patient stated she was never given discharge instructions and nothing was explained to her. She found the discharge instructions on MyChart. Medications obtained and verified? Yes  Other?  Patient stated she was wheeled out of the ER without a walker and was left unattended. Any new allergies since your discharge? No  Dietary orders reviewed? Yes Do you have support at home? Yes   Follow up appointments reviewed:  PCP Hospital f/u appt confirmed? No  Scheduled to see  on  @ . Specialist Hospital f/u appt confirmed?  Patient plans to call Orthopedic Surgeon  Scheduled to see  on  @ . Are transportation arrangements needed? No  If their condition worsens, is the pt aware to call PCP or go to the Emergency Dept.? Yes Was the patient provided with contact information for the PCP's office or ED? Yes Was to pt encouraged to call back with questions or concerns? Yes  Annette Stevens [facility]  [facility] Population Health Community Resource Care Guide   [email or web address]  ?? [phone]   Website: [email or web address]  [email or web address]

## 2023-04-23 DIAGNOSIS — M1612 Unilateral primary osteoarthritis, left hip: Secondary | ICD-10-CM | POA: Diagnosis not present

## 2023-04-23 DIAGNOSIS — S7292XS Unspecified fracture of left femur, sequela: Secondary | ICD-10-CM | POA: Diagnosis not present

## 2023-04-23 DIAGNOSIS — M87852 Other osteonecrosis, left femur: Secondary | ICD-10-CM | POA: Diagnosis not present

## 2023-04-23 DIAGNOSIS — Z0189 Encounter for other specified special examinations: Secondary | ICD-10-CM | POA: Diagnosis not present

## 2023-04-28 ENCOUNTER — Ambulatory Visit: Payer: Self-pay | Admitting: *Deleted

## 2023-04-28 NOTE — Telephone Encounter (Signed)
  Chief Complaint: Medication Question Symptoms: One episode of chest pain 04/24/23 "High up under my chin." States was scraping ice from freezer with arms up "And I think it's from that."  HAs not occurred since. States mild dizziness 2 nights ago getting from bed to BR, has not occurred since.  Frequency: 04/24/23 Pertinent Negatives: Patient denies any symptoms presently Disposition: [] ED /[] Urgent Care (no appt availability in office) / [] Appointment(In office/virtual)/ []  [place] [facility]/ [] Home Care/ [] Refused Recommended Disposition /[]  Mobile Bus/ [x]  Follow-up with PCP Additional Notes: Pt initially stated she thinks these episodes are from the meloxicam ED prescribed. Has taken for "Sixteen days."  Pt then stated CP was "Probably from fixing my freezer, hard work."  Research officer, political party historian. Expresses frustration "CAn't get my question answered, ortho told me to call my doctor."  Pt is questioning if she can take 1/2 tab of meloxicam. "If that is causing these symptoms." Assured pt NT would route to practice for PCP's review and final disposition. Please advise. Reason for Disposition  [1] Chest pain lasts > 5 minutes AND [2] occurred > 3 days ago (72 hours) AND [3] NO chest pain or cardiac symptoms now  Answer Assessment - Initial Assessment Questions 1. LOCATION: "Where does it hurt?"       "High" chest, under chin 2. RADIATION: "Does the pain go anywhere else?" (e.g., into neck, jaw, arms, back)     No 3. ONSET: "When did the chest pain begin?" (Minutes, hours or days)      Thursday 04/24/23 4. PATTERN: "Does the pain come and go, or has it been constant since it started?"  "Does it get worse with exertion?"      Gone, duration 15 minutes,  5. DURATION: "How long does it last" (e.g., seconds, minutes, hours)     seconds 6. SEVERITY: "How bad is the pain?"  (e.g., Scale 1-10; mild, moderate, or severe)    - MILD (1-3): doesn't interfere with normal activities     - MODERATE  (4-7): interferes with normal activities or awakens from sleep    - SEVERE (8-10): excruciating pain, unable to do any normal activities       None since 7. CARDIAC RISK FACTORS: "Do you have any history of heart problems or risk factors for heart disease?" (e.g., angina, prior heart attack; diabetes, high blood pressure, high cholesterol, smoker, or strong family history of heart disease)     Yes 8. PULMONARY RISK FACTORS: "Do you have any history of lung disease?"  (e.g., blood clots in lung, asthma, emphysema, birth control pills)      9. CAUSE: "What do you think is causing the chest pain?"     Meloxicam 10. OTHER SYMPTOMS: "Do you have any other symptoms?" (e.g., dizziness, nausea, vomiting, sweating, fever, difficulty breathing, cough)       Dizziness x 1 episode 2 nights ago. None since.  Protocols used: Chest Pain-A-AH

## 2023-04-28 NOTE — Telephone Encounter (Signed)
Pt.notified

## 2023-04-29 ENCOUNTER — Ambulatory Visit: Payer: Medicare Other | Admitting: Obstetrics and Gynecology

## 2023-05-06 DIAGNOSIS — M25552 Pain in left hip: Secondary | ICD-10-CM | POA: Diagnosis not present

## 2023-05-06 DIAGNOSIS — M1652 Unilateral post-traumatic osteoarthritis, left hip: Secondary | ICD-10-CM | POA: Diagnosis not present

## 2023-05-06 DIAGNOSIS — S72115A Nondisplaced fracture of greater trochanter of left femur, initial encounter for closed fracture: Secondary | ICD-10-CM | POA: Diagnosis not present

## 2023-05-13 ENCOUNTER — Telehealth: Payer: Self-pay

## 2023-05-13 NOTE — Telephone Encounter (Signed)
   Telephone encounter was:  Successful.  05/13/2023 Name: Annette Stevens MRN: [id number] DOB: 03/07/1946  Annette Stevens is a 77 y.o. year old female who is a primary care patient of Margarita Mail, DO . The community resource team was consulted for assistance with Food Insecurity  Care guide performed the following interventions: Returned patients voicemail message. Spoke with patient about referrals to [employer] and [facility].  Follow Up Plan:  No further follow up planned at this time. The patient has been provided with needed resources.  Joanmarie Tsang [facility]  [facility] Population Health Community Resource Care Guide   [email or web address]  ?? [phone]   Website: [email or web address]  [email or web address]

## 2023-06-03 DIAGNOSIS — Z131 Encounter for screening for diabetes mellitus: Secondary | ICD-10-CM | POA: Diagnosis not present

## 2023-06-03 DIAGNOSIS — Z87891 Personal history of nicotine dependence: Secondary | ICD-10-CM | POA: Diagnosis not present

## 2023-06-03 DIAGNOSIS — M1612 Unilateral primary osteoarthritis, left hip: Secondary | ICD-10-CM | POA: Diagnosis not present

## 2023-06-03 DIAGNOSIS — E079 Disorder of thyroid, unspecified: Secondary | ICD-10-CM | POA: Diagnosis not present

## 2023-06-03 DIAGNOSIS — M1652 Unilateral post-traumatic osteoarthritis, left hip: Secondary | ICD-10-CM | POA: Diagnosis not present

## 2023-06-03 DIAGNOSIS — M25452 Effusion, left hip: Secondary | ICD-10-CM | POA: Diagnosis not present

## 2023-06-09 ENCOUNTER — Telehealth: Payer: Self-pay | Admitting: Obstetrics and Gynecology

## 2023-06-09 NOTE — Telephone Encounter (Signed)
Patient called today and said she wanted to speak to Dr. Florian Buff directly. Will not speak to anyone else about her issue.  I told her that Dr. Florian Buff was in surgery today.  I asked if she had a medical issue going on so that I could get some information  on what this was about. She said it was not a medical issue.  She refused to give me details, but she said she "just found out something" and "it needs to be addressed immediately".  She said she could be reached on Thursday morning at [phone].

## 2023-06-10 DIAGNOSIS — M1612 Unilateral primary osteoarthritis, left hip: Secondary | ICD-10-CM | POA: Diagnosis not present

## 2023-06-10 DIAGNOSIS — X58XXXA Exposure to other specified factors, initial encounter: Secondary | ICD-10-CM | POA: Diagnosis not present

## 2023-06-10 DIAGNOSIS — M1652 Unilateral post-traumatic osteoarthritis, left hip: Secondary | ICD-10-CM | POA: Diagnosis not present

## 2023-06-10 DIAGNOSIS — S72112A Displaced fracture of greater trochanter of left femur, initial encounter for closed fracture: Secondary | ICD-10-CM | POA: Diagnosis not present

## 2023-06-10 DIAGNOSIS — E079 Disorder of thyroid, unspecified: Secondary | ICD-10-CM | POA: Diagnosis not present

## 2023-06-10 DIAGNOSIS — Z01818 Encounter for other preprocedural examination: Secondary | ICD-10-CM | POA: Diagnosis not present

## 2023-06-10 DIAGNOSIS — M4310 Spondylolisthesis, site unspecified: Secondary | ICD-10-CM | POA: Diagnosis not present

## 2023-06-12 ENCOUNTER — Encounter: Payer: Self-pay | Admitting: Obstetrics and Gynecology

## 2023-06-12 ENCOUNTER — Ambulatory Visit (INDEPENDENT_AMBULATORY_CARE_PROVIDER_SITE_OTHER): Payer: Medicare Other | Admitting: Obstetrics and Gynecology

## 2023-06-12 VITALS — BP 158/103 | HR 93 | Wt 125.6 lb

## 2023-06-12 DIAGNOSIS — N993 Prolapse of vaginal vault after hysterectomy: Secondary | ICD-10-CM

## 2023-06-12 DIAGNOSIS — N8110 Cystocele, unspecified: Secondary | ICD-10-CM

## 2023-06-12 NOTE — Progress Notes (Signed)
[facility]   Subjective:     Chief Complaint:  Chief Complaint  Patient presents with   Follow-up    Annette Stevens is a 77 y.o. female here for a pessary removal   History of Present Illness: Annette Stevens is a 77 y.o. female with stage IV pelvic organ prolapse who presents for a pessary check.  She has a #5 cube pessary. She feels that she has more leakage, and bleeding with this pessary. She was using vaginal estrogen cream. She does not want the pessary back in place.   She is having a hip replacement next week and her hip issues have greatly decreased her mobility. .   Past Medical History: Patient  has a past medical history of Hypertension, Hypokalemia, Hypothyroidism, Premature atrial contractions, and PSVT (paroxysmal supraventricular tachycardia).   Past Surgical History: She  has a past surgical history that includes Bunionectomy (Left, 2006); Tubal ligation (1969); Image guided sinus surgery (N/A, 02/24/2018); Maxillary antrostomy (Left, 02/24/2018); Ethmoidectomy (Left, 02/24/2018); Frontal sinus exploration (Left, 02/24/2018); Sphenoidectomy (Left, 02/24/2018); and Vaginal hysterectomy (1980).   Medications: She has a current medication list which includes the following prescription(s): cholecalciferol, estradiol, levothyroxine, meloxicam, and multivitamin.   Allergies: Patient is allergic to ace inhibitors, beta adrenergic blockers, calcium channel blockers, and other.   Social History: Patient  reports that she quit smoking about 29 years ago. Her smoking use included cigarettes. She has never used smokeless tobacco. She reports that she does not currently use alcohol after a past usage of about 1.0 standard drink of alcohol per week. She reports that she does not use drugs.      Objective:    Physical Exam: BP (!) 158/103   Pulse 93   Wt 125 lb 9.6 oz (57 kg)   BMI 23.54 kg/m  Gen: No apparent distress, A&O x 3. Detailed Urogynecologic  Evaluation:  Pelvic Exam: Normal external female genitalia; Bartholin's and Skene's glands normal in appearance; urethral meatus normal in appearance, no urethral masses or discharge. The pessary was noted to be in place. It was removed and cleaned. Speculum exam revealed multiple areas of bleeding and erosion on the vagina walls. The tissue was cleaned and silver nitrate was used for hemostasis. Premarin cream was placed in the vagina.   POP-Q (02/18/23)   3                                            Aa   5                                           Ba   5                                              C    6                                            Gh   3  Pb   7                                            tvl    -2                                            Ap   5                                            Bp                                                  D      Assessment/Plan:    Assessment: Annette Stevens is a 77 y.o. with stage IV pelvic organ prolapse here for a pessary check.   Plan: - Pessary removed today.  - We again reviewed option of surgery but she is having a hip replacement next week and healing from this is a priority for her. She lives alone so will need to figure out when she can have surgery again.  - She will contact our office for an appt when she is ready to proceed.   Marguerita Beards, MD   Time Spent:  I spent 25 minutes dedicated to the care of this patient on the date of this encounter to include pre-visit review of records, face-to-face time with the patient and post visit documentation.

## 2023-06-18 DIAGNOSIS — Z79899 Other long term (current) drug therapy: Secondary | ICD-10-CM | POA: Diagnosis not present

## 2023-06-18 DIAGNOSIS — R768 Other specified abnormal immunological findings in serum: Secondary | ICD-10-CM | POA: Diagnosis not present

## 2023-06-18 DIAGNOSIS — Z87891 Personal history of nicotine dependence: Secondary | ICD-10-CM | POA: Diagnosis not present

## 2023-06-18 DIAGNOSIS — R54 Age-related physical debility: Secondary | ICD-10-CM | POA: Diagnosis not present

## 2023-06-18 DIAGNOSIS — I10 Essential (primary) hypertension: Secondary | ICD-10-CM | POA: Diagnosis not present

## 2023-06-18 DIAGNOSIS — E119 Type 2 diabetes mellitus without complications: Secondary | ICD-10-CM | POA: Diagnosis not present

## 2023-06-18 DIAGNOSIS — Z8679 Personal history of other diseases of the circulatory system: Secondary | ICD-10-CM | POA: Diagnosis not present

## 2023-06-18 DIAGNOSIS — N39498 Other specified urinary incontinence: Secondary | ICD-10-CM | POA: Diagnosis not present

## 2023-06-18 DIAGNOSIS — E039 Hypothyroidism, unspecified: Secondary | ICD-10-CM | POA: Diagnosis not present

## 2023-06-18 DIAGNOSIS — I471 Supraventricular tachycardia, unspecified: Secondary | ICD-10-CM | POA: Diagnosis not present

## 2023-06-18 DIAGNOSIS — M1612 Unilateral primary osteoarthritis, left hip: Secondary | ICD-10-CM | POA: Diagnosis not present

## 2023-06-18 DIAGNOSIS — E559 Vitamin D deficiency, unspecified: Secondary | ICD-10-CM | POA: Diagnosis not present

## 2023-06-18 DIAGNOSIS — M4316 Spondylolisthesis, lumbar region: Secondary | ICD-10-CM | POA: Diagnosis not present

## 2023-06-18 DIAGNOSIS — Z96642 Presence of left artificial hip joint: Secondary | ICD-10-CM | POA: Diagnosis not present

## 2023-06-19 DIAGNOSIS — E559 Vitamin D deficiency, unspecified: Secondary | ICD-10-CM | POA: Diagnosis not present

## 2023-06-19 DIAGNOSIS — E039 Hypothyroidism, unspecified: Secondary | ICD-10-CM | POA: Diagnosis not present

## 2023-06-19 DIAGNOSIS — M1612 Unilateral primary osteoarthritis, left hip: Secondary | ICD-10-CM | POA: Diagnosis not present

## 2023-06-19 DIAGNOSIS — E119 Type 2 diabetes mellitus without complications: Secondary | ICD-10-CM | POA: Diagnosis not present

## 2023-06-19 DIAGNOSIS — M4316 Spondylolisthesis, lumbar region: Secondary | ICD-10-CM | POA: Diagnosis not present

## 2023-06-19 DIAGNOSIS — I10 Essential (primary) hypertension: Secondary | ICD-10-CM | POA: Diagnosis not present

## 2023-06-20 DIAGNOSIS — E119 Type 2 diabetes mellitus without complications: Secondary | ICD-10-CM | POA: Diagnosis not present

## 2023-06-20 DIAGNOSIS — E039 Hypothyroidism, unspecified: Secondary | ICD-10-CM | POA: Diagnosis not present

## 2023-06-20 DIAGNOSIS — M4316 Spondylolisthesis, lumbar region: Secondary | ICD-10-CM | POA: Diagnosis not present

## 2023-06-20 DIAGNOSIS — I10 Essential (primary) hypertension: Secondary | ICD-10-CM | POA: Diagnosis not present

## 2023-06-20 DIAGNOSIS — M1612 Unilateral primary osteoarthritis, left hip: Secondary | ICD-10-CM | POA: Diagnosis not present

## 2023-06-20 DIAGNOSIS — E559 Vitamin D deficiency, unspecified: Secondary | ICD-10-CM | POA: Diagnosis not present

## 2023-06-24 DIAGNOSIS — I10 Essential (primary) hypertension: Secondary | ICD-10-CM | POA: Diagnosis not present

## 2023-06-24 DIAGNOSIS — M7702 Medial epicondylitis, left elbow: Secondary | ICD-10-CM | POA: Diagnosis not present

## 2023-06-24 DIAGNOSIS — Z87891 Personal history of nicotine dependence: Secondary | ICD-10-CM | POA: Diagnosis not present

## 2023-06-24 DIAGNOSIS — Z9181 History of falling: Secondary | ICD-10-CM | POA: Diagnosis not present

## 2023-06-24 DIAGNOSIS — I471 Supraventricular tachycardia, unspecified: Secondary | ICD-10-CM | POA: Diagnosis not present

## 2023-06-24 DIAGNOSIS — E039 Hypothyroidism, unspecified: Secondary | ICD-10-CM | POA: Diagnosis not present

## 2023-06-24 DIAGNOSIS — K5730 Diverticulosis of large intestine without perforation or abscess without bleeding: Secondary | ICD-10-CM | POA: Diagnosis not present

## 2023-06-24 DIAGNOSIS — M47819 Spondylosis without myelopathy or radiculopathy, site unspecified: Secondary | ICD-10-CM | POA: Diagnosis not present

## 2023-06-24 DIAGNOSIS — Z7982 Long term (current) use of aspirin: Secondary | ICD-10-CM | POA: Diagnosis not present

## 2023-06-24 DIAGNOSIS — M5432 Sciatica, left side: Secondary | ICD-10-CM | POA: Diagnosis not present

## 2023-06-24 DIAGNOSIS — Z96642 Presence of left artificial hip joint: Secondary | ICD-10-CM | POA: Diagnosis not present

## 2023-06-24 DIAGNOSIS — Z960 Presence of urogenital implants: Secondary | ICD-10-CM | POA: Diagnosis not present

## 2023-06-24 DIAGNOSIS — N814 Uterovaginal prolapse, unspecified: Secondary | ICD-10-CM | POA: Diagnosis not present

## 2023-06-24 DIAGNOSIS — M7711 Lateral epicondylitis, right elbow: Secondary | ICD-10-CM | POA: Diagnosis not present

## 2023-06-24 DIAGNOSIS — Z471 Aftercare following joint replacement surgery: Secondary | ICD-10-CM | POA: Diagnosis not present

## 2023-06-24 DIAGNOSIS — M170 Bilateral primary osteoarthritis of knee: Secondary | ICD-10-CM | POA: Diagnosis not present

## 2023-06-24 DIAGNOSIS — M4316 Spondylolisthesis, lumbar region: Secondary | ICD-10-CM | POA: Diagnosis not present

## 2023-06-24 DIAGNOSIS — E119 Type 2 diabetes mellitus without complications: Secondary | ICD-10-CM | POA: Diagnosis not present

## 2023-06-25 DIAGNOSIS — I471 Supraventricular tachycardia, unspecified: Secondary | ICD-10-CM | POA: Diagnosis not present

## 2023-06-25 DIAGNOSIS — I10 Essential (primary) hypertension: Secondary | ICD-10-CM | POA: Diagnosis not present

## 2023-06-25 DIAGNOSIS — M170 Bilateral primary osteoarthritis of knee: Secondary | ICD-10-CM | POA: Diagnosis not present

## 2023-06-25 DIAGNOSIS — Z471 Aftercare following joint replacement surgery: Secondary | ICD-10-CM | POA: Diagnosis not present

## 2023-06-25 DIAGNOSIS — E119 Type 2 diabetes mellitus without complications: Secondary | ICD-10-CM | POA: Diagnosis not present

## 2023-06-25 DIAGNOSIS — Z96642 Presence of left artificial hip joint: Secondary | ICD-10-CM | POA: Diagnosis not present

## 2023-06-30 DIAGNOSIS — I471 Supraventricular tachycardia, unspecified: Secondary | ICD-10-CM | POA: Diagnosis not present

## 2023-06-30 DIAGNOSIS — M170 Bilateral primary osteoarthritis of knee: Secondary | ICD-10-CM | POA: Diagnosis not present

## 2023-06-30 DIAGNOSIS — I10 Essential (primary) hypertension: Secondary | ICD-10-CM | POA: Diagnosis not present

## 2023-06-30 DIAGNOSIS — E119 Type 2 diabetes mellitus without complications: Secondary | ICD-10-CM | POA: Diagnosis not present

## 2023-06-30 DIAGNOSIS — Z471 Aftercare following joint replacement surgery: Secondary | ICD-10-CM | POA: Diagnosis not present

## 2023-06-30 DIAGNOSIS — Z96642 Presence of left artificial hip joint: Secondary | ICD-10-CM | POA: Diagnosis not present

## 2023-07-02 ENCOUNTER — Ambulatory Visit: Payer: Medicare Other | Admitting: Internal Medicine

## 2023-07-02 DIAGNOSIS — M170 Bilateral primary osteoarthritis of knee: Secondary | ICD-10-CM | POA: Diagnosis not present

## 2023-07-02 DIAGNOSIS — Z96642 Presence of left artificial hip joint: Secondary | ICD-10-CM | POA: Diagnosis not present

## 2023-07-02 DIAGNOSIS — I471 Supraventricular tachycardia, unspecified: Secondary | ICD-10-CM | POA: Diagnosis not present

## 2023-07-02 DIAGNOSIS — Z471 Aftercare following joint replacement surgery: Secondary | ICD-10-CM | POA: Diagnosis not present

## 2023-07-02 DIAGNOSIS — E119 Type 2 diabetes mellitus without complications: Secondary | ICD-10-CM | POA: Diagnosis not present

## 2023-07-02 DIAGNOSIS — I10 Essential (primary) hypertension: Secondary | ICD-10-CM | POA: Diagnosis not present

## 2023-07-03 DIAGNOSIS — E119 Type 2 diabetes mellitus without complications: Secondary | ICD-10-CM | POA: Diagnosis not present

## 2023-07-03 DIAGNOSIS — M170 Bilateral primary osteoarthritis of knee: Secondary | ICD-10-CM | POA: Diagnosis not present

## 2023-07-03 DIAGNOSIS — I10 Essential (primary) hypertension: Secondary | ICD-10-CM | POA: Diagnosis not present

## 2023-07-03 DIAGNOSIS — I471 Supraventricular tachycardia, unspecified: Secondary | ICD-10-CM | POA: Diagnosis not present

## 2023-07-03 DIAGNOSIS — Z471 Aftercare following joint replacement surgery: Secondary | ICD-10-CM | POA: Diagnosis not present

## 2023-07-03 DIAGNOSIS — Z96642 Presence of left artificial hip joint: Secondary | ICD-10-CM | POA: Diagnosis not present

## 2023-07-04 DIAGNOSIS — I471 Supraventricular tachycardia, unspecified: Secondary | ICD-10-CM | POA: Diagnosis not present

## 2023-07-04 DIAGNOSIS — I10 Essential (primary) hypertension: Secondary | ICD-10-CM | POA: Diagnosis not present

## 2023-07-04 DIAGNOSIS — Z471 Aftercare following joint replacement surgery: Secondary | ICD-10-CM | POA: Diagnosis not present

## 2023-07-04 DIAGNOSIS — M170 Bilateral primary osteoarthritis of knee: Secondary | ICD-10-CM | POA: Diagnosis not present

## 2023-07-04 DIAGNOSIS — Z96642 Presence of left artificial hip joint: Secondary | ICD-10-CM | POA: Diagnosis not present

## 2023-07-04 DIAGNOSIS — E119 Type 2 diabetes mellitus without complications: Secondary | ICD-10-CM | POA: Diagnosis not present

## 2023-07-07 DIAGNOSIS — Z96642 Presence of left artificial hip joint: Secondary | ICD-10-CM | POA: Diagnosis not present

## 2023-07-07 DIAGNOSIS — Z471 Aftercare following joint replacement surgery: Secondary | ICD-10-CM | POA: Diagnosis not present

## 2023-07-07 DIAGNOSIS — M170 Bilateral primary osteoarthritis of knee: Secondary | ICD-10-CM | POA: Diagnosis not present

## 2023-07-07 DIAGNOSIS — I471 Supraventricular tachycardia, unspecified: Secondary | ICD-10-CM | POA: Diagnosis not present

## 2023-07-07 DIAGNOSIS — E119 Type 2 diabetes mellitus without complications: Secondary | ICD-10-CM | POA: Diagnosis not present

## 2023-07-07 DIAGNOSIS — I10 Essential (primary) hypertension: Secondary | ICD-10-CM | POA: Diagnosis not present

## 2023-07-08 ENCOUNTER — Other Ambulatory Visit: Payer: Self-pay | Admitting: Internal Medicine

## 2023-07-08 DIAGNOSIS — E039 Hypothyroidism, unspecified: Secondary | ICD-10-CM

## 2023-07-09 DIAGNOSIS — I471 Supraventricular tachycardia, unspecified: Secondary | ICD-10-CM | POA: Diagnosis not present

## 2023-07-09 DIAGNOSIS — I10 Essential (primary) hypertension: Secondary | ICD-10-CM | POA: Diagnosis not present

## 2023-07-09 DIAGNOSIS — M170 Bilateral primary osteoarthritis of knee: Secondary | ICD-10-CM | POA: Diagnosis not present

## 2023-07-09 DIAGNOSIS — E119 Type 2 diabetes mellitus without complications: Secondary | ICD-10-CM | POA: Diagnosis not present

## 2023-07-09 DIAGNOSIS — Z96642 Presence of left artificial hip joint: Secondary | ICD-10-CM | POA: Diagnosis not present

## 2023-07-09 DIAGNOSIS — Z471 Aftercare following joint replacement surgery: Secondary | ICD-10-CM | POA: Diagnosis not present

## 2023-07-09 NOTE — Telephone Encounter (Signed)
Requested Prescriptions  Pending Prescriptions Disp Refills   levothyroxine (SYNTHROID) 125 MCG tablet [Pharmacy Med Name: Levothyroxine Sodium 125 MCG Oral Tablet] 90 tablet 0    Sig: Take 1 tablet by mouth once daily     Endocrinology:  Hypothyroid Agents Passed - 07/08/2023  8:25 AM      Passed - TSH in normal range and within 360 days    TSH  Date Value Ref Range Status  10/11/2022 0.90 0.40 - 4.50 mIU/L Final         Passed - Valid encounter within last 12 months    Recent Outpatient Visits           9 months ago Irregular heartbeat   [facility] Margarita Mail, DO   10 months ago Hypothyroidism, unspecified type   [facility] Margarita Mail, DO   1 year ago Osteoarthritis of both knees, unspecified osteoarthritis type   [facility] Margarita Mail, DO   1 year ago Chronic diarrhea   [facility] Margarita Mail, DO   1 year ago Diarrhea, unspecified type   [facility] Margarita Mail, [place]

## 2023-07-11 DIAGNOSIS — Z96642 Presence of left artificial hip joint: Secondary | ICD-10-CM | POA: Diagnosis not present

## 2023-07-11 DIAGNOSIS — Z471 Aftercare following joint replacement surgery: Secondary | ICD-10-CM | POA: Diagnosis not present

## 2023-07-11 DIAGNOSIS — E119 Type 2 diabetes mellitus without complications: Secondary | ICD-10-CM | POA: Diagnosis not present

## 2023-07-11 DIAGNOSIS — I471 Supraventricular tachycardia, unspecified: Secondary | ICD-10-CM | POA: Diagnosis not present

## 2023-07-11 DIAGNOSIS — M170 Bilateral primary osteoarthritis of knee: Secondary | ICD-10-CM | POA: Diagnosis not present

## 2023-07-11 DIAGNOSIS — I10 Essential (primary) hypertension: Secondary | ICD-10-CM | POA: Diagnosis not present

## 2023-07-15 DIAGNOSIS — I471 Supraventricular tachycardia, unspecified: Secondary | ICD-10-CM | POA: Diagnosis not present

## 2023-07-15 DIAGNOSIS — Z96642 Presence of left artificial hip joint: Secondary | ICD-10-CM | POA: Diagnosis not present

## 2023-07-15 DIAGNOSIS — E119 Type 2 diabetes mellitus without complications: Secondary | ICD-10-CM | POA: Diagnosis not present

## 2023-07-15 DIAGNOSIS — M170 Bilateral primary osteoarthritis of knee: Secondary | ICD-10-CM | POA: Diagnosis not present

## 2023-07-15 DIAGNOSIS — I10 Essential (primary) hypertension: Secondary | ICD-10-CM | POA: Diagnosis not present

## 2023-07-15 DIAGNOSIS — Z471 Aftercare following joint replacement surgery: Secondary | ICD-10-CM | POA: Diagnosis not present

## 2023-07-17 DIAGNOSIS — M170 Bilateral primary osteoarthritis of knee: Secondary | ICD-10-CM | POA: Diagnosis not present

## 2023-07-17 DIAGNOSIS — Z471 Aftercare following joint replacement surgery: Secondary | ICD-10-CM | POA: Diagnosis not present

## 2023-07-17 DIAGNOSIS — I471 Supraventricular tachycardia, unspecified: Secondary | ICD-10-CM | POA: Diagnosis not present

## 2023-07-17 DIAGNOSIS — E119 Type 2 diabetes mellitus without complications: Secondary | ICD-10-CM | POA: Diagnosis not present

## 2023-07-17 DIAGNOSIS — I10 Essential (primary) hypertension: Secondary | ICD-10-CM | POA: Diagnosis not present

## 2023-07-17 DIAGNOSIS — Z96642 Presence of left artificial hip joint: Secondary | ICD-10-CM | POA: Diagnosis not present

## 2023-07-22 DIAGNOSIS — M170 Bilateral primary osteoarthritis of knee: Secondary | ICD-10-CM | POA: Diagnosis not present

## 2023-07-22 DIAGNOSIS — Z96642 Presence of left artificial hip joint: Secondary | ICD-10-CM | POA: Diagnosis not present

## 2023-07-22 DIAGNOSIS — Z471 Aftercare following joint replacement surgery: Secondary | ICD-10-CM | POA: Diagnosis not present

## 2023-07-22 DIAGNOSIS — I10 Essential (primary) hypertension: Secondary | ICD-10-CM | POA: Diagnosis not present

## 2023-07-22 DIAGNOSIS — E119 Type 2 diabetes mellitus without complications: Secondary | ICD-10-CM | POA: Diagnosis not present

## 2023-07-22 DIAGNOSIS — I471 Supraventricular tachycardia, unspecified: Secondary | ICD-10-CM | POA: Diagnosis not present

## 2023-07-24 DIAGNOSIS — I10 Essential (primary) hypertension: Secondary | ICD-10-CM | POA: Diagnosis not present

## 2023-07-24 DIAGNOSIS — N814 Uterovaginal prolapse, unspecified: Secondary | ICD-10-CM | POA: Diagnosis not present

## 2023-07-24 DIAGNOSIS — E039 Hypothyroidism, unspecified: Secondary | ICD-10-CM | POA: Diagnosis not present

## 2023-07-24 DIAGNOSIS — Z960 Presence of urogenital implants: Secondary | ICD-10-CM | POA: Diagnosis not present

## 2023-07-24 DIAGNOSIS — M47819 Spondylosis without myelopathy or radiculopathy, site unspecified: Secondary | ICD-10-CM | POA: Diagnosis not present

## 2023-07-24 DIAGNOSIS — M170 Bilateral primary osteoarthritis of knee: Secondary | ICD-10-CM | POA: Diagnosis not present

## 2023-07-24 DIAGNOSIS — Z471 Aftercare following joint replacement surgery: Secondary | ICD-10-CM | POA: Diagnosis not present

## 2023-07-24 DIAGNOSIS — Z9181 History of falling: Secondary | ICD-10-CM | POA: Diagnosis not present

## 2023-07-24 DIAGNOSIS — E119 Type 2 diabetes mellitus without complications: Secondary | ICD-10-CM | POA: Diagnosis not present

## 2023-07-24 DIAGNOSIS — K5730 Diverticulosis of large intestine without perforation or abscess without bleeding: Secondary | ICD-10-CM | POA: Diagnosis not present

## 2023-07-24 DIAGNOSIS — M4316 Spondylolisthesis, lumbar region: Secondary | ICD-10-CM | POA: Diagnosis not present

## 2023-07-24 DIAGNOSIS — Z96642 Presence of left artificial hip joint: Secondary | ICD-10-CM | POA: Diagnosis not present

## 2023-07-24 DIAGNOSIS — M5432 Sciatica, left side: Secondary | ICD-10-CM | POA: Diagnosis not present

## 2023-07-24 DIAGNOSIS — M7711 Lateral epicondylitis, right elbow: Secondary | ICD-10-CM | POA: Diagnosis not present

## 2023-07-24 DIAGNOSIS — Z7982 Long term (current) use of aspirin: Secondary | ICD-10-CM | POA: Diagnosis not present

## 2023-07-24 DIAGNOSIS — Z87891 Personal history of nicotine dependence: Secondary | ICD-10-CM | POA: Diagnosis not present

## 2023-07-24 DIAGNOSIS — M7702 Medial epicondylitis, left elbow: Secondary | ICD-10-CM | POA: Diagnosis not present

## 2023-07-24 DIAGNOSIS — I471 Supraventricular tachycardia, unspecified: Secondary | ICD-10-CM | POA: Diagnosis not present

## 2023-09-26 DIAGNOSIS — Z96642 Presence of left artificial hip joint: Secondary | ICD-10-CM | POA: Diagnosis not present

## 2023-09-26 DIAGNOSIS — M1711 Unilateral primary osteoarthritis, right knee: Secondary | ICD-10-CM | POA: Diagnosis not present

## 2023-10-06 ENCOUNTER — Ambulatory Visit: Payer: Self-pay

## 2023-10-06 ENCOUNTER — Other Ambulatory Visit: Payer: Self-pay | Admitting: Internal Medicine

## 2023-10-06 DIAGNOSIS — E039 Hypothyroidism, unspecified: Secondary | ICD-10-CM

## 2023-10-06 NOTE — Progress Notes (Signed)
Established Patient Office Visit  Subjective   Patient ID: Annette Stevens, female    DOB: 1946-10-07  Age: 77 y.o. MRN: [id number]  Chief Complaint  Patient presents with   Abdominal Pain    HPI  Patient is here for follow up on chronic medical conditions. Since out LOV, patient has underwent left THA at [facility]. Overall doing well.   Abdominal Complaint: -Duration: weeks -Frequency: a few times a day - about 4 times a day  -Nature: dull, aching, and cramping -Location: LLQ  -Radiation: no -Alleviating factors: None -Treatments attempted: Imodium  -Diarrhea: yes - watery stools multiple times a day -Episodes of diarrhea/day: -Heartburn: yes -Bloating:yes -Nausea: no -Vomiting: no -Episodes of vomit/day: -Melena or hematochezia: no -Fever: no -Weight loss: no -Had a CT of her hip in August, showed colonic diverticulosis with diverticulitis at the time.   Also had an elevated A1c to 6.8% in August.   Hypertension: -Medications: Nothing currently, had been on medications in the past but did not tolerate them. Doesn't remember which medications she was on. Does not want to be on medications.  -Blood pressure better controlled today  Hypothyroidism: -Medications: Levothyroxine 125 mcg  -Patient is compliant with the above medication (s) at the above dose and reports no medication side effects.   -Last TSH: 12/23 0.90   Health Maintenance: -Blood work due     Review of Systems  Constitutional:  Negative for chills and fever.  Cardiovascular:  Negative for chest pain.  Gastrointestinal:  Positive for abdominal pain, diarrhea and nausea. Negative for blood in stool, heartburn, melena and vomiting.  Genitourinary:  Negative for dysuria, hematuria and urgency.      Objective:     BP (!) 142/86   Pulse 96   Temp 98.2 F (36.8 C) (Oral)   Resp 18   Ht 5\' 2"  (1.575 m)   Wt 129 lb 8 oz (58.7 kg)   SpO2 98%   BMI 23.69 kg/m  BP Readings from Last 3 Encounters:   10/07/23 (!) 142/86  06/12/23 (!) 158/103  04/10/23 (!) 148/80   Wt Readings from Last 3 Encounters:  10/07/23 129 lb 8 oz (58.7 kg)  06/12/23 125 lb 9.6 oz (57 kg)  04/10/23 123 lb 0.3 oz (55.8 kg)      Physical Exam Constitutional:      Appearance: Normal appearance.  HENT:     Head: Normocephalic and atraumatic.  Eyes:     Conjunctiva/sclera: Conjunctivae normal.  Cardiovascular:     Rate and Rhythm: Normal rate and regular rhythm.  Pulmonary:     Effort: Pulmonary effort is normal.     Breath sounds: Normal breath sounds.  Abdominal:     General: Bowel sounds are normal. There is no distension.     Palpations: Abdomen is soft. There is no mass.     Tenderness: There is abdominal tenderness. There is no guarding or rebound.     Hernia: No hernia is present.     Comments: LLQ pain to palpation   Skin:    General: Skin is warm and dry.  Neurological:     General: No focal deficit present.     Mental Status: She is alert. Mental status is at baseline.  Psychiatric:        Mood and Affect: Mood normal.        Behavior: Behavior normal.      No results found for any visits on 10/07/23.  Last CBC Lab Results  Component  Value Date   WBC 6.9 10/11/2022   HGB 14.3 10/11/2022   HCT 43.3 10/11/2022   MCV 84.7 10/11/2022   MCH 28.0 10/11/2022   RDW 13.1 10/11/2022   PLT 331 10/11/2022   Last metabolic panel Lab Results  Component Value Date   GLUCOSE 127 (H) 02/12/2023   NA 139 02/12/2023   K 3.8 02/12/2023   CL 102 02/12/2023   CO2 27 02/12/2023   BUN 18 02/12/2023   CREATININE 0.64 02/12/2023   GFRNONAA >60 02/12/2023   CALCIUM 9.3 02/12/2023   PROT 6.3 10/11/2022   ALBUMIN 4.1 08/01/2022   LABGLOB 2.7 08/01/2022   AGRATIO 1.5 08/01/2022   BILITOT 0.4 10/11/2022   ALKPHOS 98 08/01/2022   AST 14 10/11/2022   ALT 8 10/11/2022   ANIONGAP 10 02/12/2023   Last lipids Lab Results  Component Value Date   CHOL 214 (H) 12/31/2018   HDL 86 12/31/2018    LDLCALC 107 (H) 12/31/2018   TRIG 112 12/31/2018   CHOLHDL 2.5 12/31/2018   Last hemoglobin A1c No results found for: "HGBA1C" Last thyroid functions Lab Results  Component Value Date   TSH 0.90 10/11/2022   T4TOTAL 11.7 10/11/2022   Last vitamin D No results found for: "25OHVITD2", "25OHVITD3", "VD25OH" Last vitamin B12 and Folate No results found for: "VITAMINB12", "FOLATE"    The ASCVD Risk score (Arnett DK, et al., 2019) failed to calculate for the following reasons:   Cannot find a previous HDL lab   Cannot find a previous total cholesterol lab    Assessment & Plan:   1. LLQ pain (Primary)/Diarrhea, unspecified type: Labs due. Will order abdominal CT, due to holidays coming up will go ahead and treat with Augmentin. Discussed starting probiotic and fiber after flare is complete.   - CBC w/Diff/Platelet - COMPLETE METABOLIC PANEL WITH GFR - CT ABDOMEN PELVIS WO CONTRAST; Future - amoxicillin-clavulanate (AUGMENTIN) 875-125 MG tablet; Take 1 tablet by mouth 3 (three) times daily for 10 days.  Dispense: 30 tablet; Refill: 0  2. Hypothyroidism, unspecified type: Recheck TSH, continue Levothyroxine at current dose for now.   - TSH  3. Elevated glucose: Check A1c.   - HgB A1c  4. Lipid screening: Check lipid panel today.   - Lipid Profile   Return in about 1 year (around 10/06/2024), or if symptoms worsen or fail to improve.    Margarita Mail, DO

## 2023-10-06 NOTE — Telephone Encounter (Signed)
Medication Refill -  Most Recent Primary Care Visit:  Provider: Margarita Mail  Department: [facility]  Visit Type: OFFICE VISIT  Date: 10/11/2022  Medication: levothyroxine (SYNTHROID) 125 MCG tablet [[id number]]   Has the patient contacted their pharmacy? Yes (Agent: If no, request that the patient contact the pharmacy for the refill. If patient does not wish to contact the pharmacy document the reason why and proceed with request.) (Agent: If yes, when and what did the pharmacy advise?)  Is this the correct pharmacy for this prescription? Yes If no, delete pharmacy and type the correct one.  This is the patient's preferred pharmacy:  [facility] [address], [place] - [address] [place] [address] Phone: [phone] Fax: [phone]   Has the prescription been filled recently? Yes  Is the patient out of the medication? Yes  Has the patient been seen for an appointment in the last year OR does the patient have an upcoming appointment? No  Can we respond through MyChart? No  Agent: Please be advised that Rx refills may take up to 3 business days. We ask that you follow-up with your pharmacy.

## 2023-10-06 NOTE — Telephone Encounter (Signed)
Chief Complaint: Diarrhea Symptoms: 3-4 episodes of watery diarrhea, abdominal discomfort, Nausea Frequency: Constant 4-5 days  Pertinent Negatives: Patient denies fever, vomiting, chest pain  Disposition: [] ED /[] Urgent Care (no appt availability in office) / [x] Appointment(In office/virtual)/ []  [place] [facility]/ [] Home Care/ [] Refused Recommended Disposition /[] [place] [facility]/ []  Follow-up with PCP Additional Notes: Patient stated she has had abdominal pain for about 4-5 days and now she is having watery diarrhea 3-4 episodes in 24 hours. Patient states she experience the same symptoms in February of this year. She does not want to go through months of diarrhea again. She stated the GI provider that she was referred to did nothing to help her and she is not going back there. Care advice was given and patient has an appointment scheduled to be seen tomorrow.  Reason for Disposition  [1] SEVERE diarrhea (e.g., 7 or more times / day more than normal) AND [2] present > 24 hours (1 day)  Answer Assessment - Initial Assessment Questions 1. DIARRHEA SEVERITY: "How bad is the diarrhea?" "How many more stools have you had in the past 24 hours than normal?"    - NO DIARRHEA (SCALE 0)   - MILD (SCALE 1-3): Few loose or mushy BMs; increase of 1-3 stools over normal daily number of stools; mild increase in ostomy output.   -  MODERATE (SCALE 4-7): Increase of 4-6 stools daily over normal; moderate increase in ostomy output.   -  SEVERE (SCALE 8-10; OR "WORST POSSIBLE"): Increase of 7 or more stools daily over normal; moderate increase in ostomy output; incontinence.     3-4 2. ONSET: "When did the diarrhea begin?"      4-5 days ago  3. BM CONSISTENCY: "How loose or watery is the diarrhea?"      Watery  4. VOMITING: "Are you also vomiting?" If Yes, ask: "How many times in the past 24 hours?"      No  5. ABDOMEN PAIN: "Are you having any abdomen pain?" If Yes, ask: "What does it feel like?"  (e.g., crampy, dull, intermittent, constant)      Sometimes I have an upset stomach  6. ABDOMEN PAIN SEVERITY: If present, ask: "How bad is the pain?"  (e.g., Scale 1-10; mild, moderate, or severe)   - MILD (1-3): doesn't interfere with normal activities, abdomen soft and not tender to touch    - MODERATE (4-7): interferes with normal activities or awakens from sleep, abdomen tender to touch    - SEVERE (8-10): excruciating pain, doubled over, unable to do any normal activities       Mild  7. ORAL INTAKE: If vomiting, "Have you been able to drink liquids?" "How much liquids have you had in the past 24 hours?"     N/A 8. HYDRATION: "Any signs of dehydration?" (e.g., dry mouth [not just dry lips], too weak to stand, dizziness, new weight loss) "When did you last urinate?"     No 9. EXPOSURE: "Have you traveled to a foreign country recently?" "Have you been exposed to anyone with diarrhea?" "Could you have eaten any food that was spoiled?"     No  10. ANTIBIOTIC USE: "Are you taking antibiotics now or have you taken antibiotics in the past 2 months?"       No  11. OTHER SYMPTOMS: "Do you have any other symptoms?" (e.g., fever, blood in stool)       Nausea  Protocols used: Diarrhea-A-AH

## 2023-10-07 ENCOUNTER — Ambulatory Visit (INDEPENDENT_AMBULATORY_CARE_PROVIDER_SITE_OTHER): Payer: Medicare Other | Admitting: Internal Medicine

## 2023-10-07 ENCOUNTER — Encounter: Payer: Self-pay | Admitting: Internal Medicine

## 2023-10-07 ENCOUNTER — Other Ambulatory Visit: Payer: Self-pay

## 2023-10-07 VITALS — BP 142/86 | HR 96 | Temp 98.2°F | Resp 18 | Ht 62.0 in | Wt 129.5 lb

## 2023-10-07 DIAGNOSIS — R1032 Left lower quadrant pain: Secondary | ICD-10-CM | POA: Diagnosis not present

## 2023-10-07 DIAGNOSIS — R7309 Other abnormal glucose: Secondary | ICD-10-CM

## 2023-10-07 DIAGNOSIS — Z13220 Encounter for screening for lipoid disorders: Secondary | ICD-10-CM | POA: Diagnosis not present

## 2023-10-07 DIAGNOSIS — E039 Hypothyroidism, unspecified: Secondary | ICD-10-CM

## 2023-10-07 DIAGNOSIS — R197 Diarrhea, unspecified: Secondary | ICD-10-CM

## 2023-10-07 MED ORDER — AMOXICILLIN-POT CLAVULANATE 875-125 MG PO TABS
1.0000 | ORAL_TABLET | Freq: Three times a day (TID) | ORAL | 0 refills | Status: AC
Start: 2023-10-07 — End: 2023-10-17

## 2023-10-07 NOTE — Patient Instructions (Addendum)
It was great seeing you today!  Plan discussed at today's visit: -Blood work ordered today, results will be uploaded to MyChart.  -CT scan ordered, please try to wait to start antibiotics until after CT scan -Start probiotics and fiber after infection clears - start low and increase slowly   Follow up in: 1 year or sooner as needed  Take care and let us know if you have any questions or concerns prior to your next visit.  Dr. Caralee Ates

## 2023-10-07 NOTE — Telephone Encounter (Signed)
Requested medication (s) are due for refill today: yes   Requested medication (s) are on the active medication list: yes   Last refill:  07/09/23 #90 0 refills  Future visit scheduled: yes today   Notes to clinic:  no refills , do you want to refill Rx?     Requested Prescriptions  Pending Prescriptions Disp Refills   levothyroxine (SYNTHROID) 125 MCG tablet 90 tablet 0    Sig: Take 1 tablet (125 mcg total) by mouth daily.     Endocrinology:  Hypothyroid Agents Failed - 10/07/2023 10:23 AM      Failed - TSH in normal range and within 360 days    TSH  Date Value Ref Range Status  10/11/2022 0.90 0.40 - 4.50 mIU/L Final         Failed - Valid encounter within last 12 months    Recent Outpatient Visits           12 months ago Irregular heartbeat   [facility] Margarita Mail, DO   1 year ago Hypothyroidism, unspecified type   [facility] Margarita Mail, DO   1 year ago Osteoarthritis of both knees, unspecified osteoarthritis type   [facility] Margarita Mail, DO   1 year ago Chronic diarrhea   [facility] Margarita Mail, DO   2 years ago Diarrhea, unspecified type   [facility] Margarita Mail, DO       Future Appointments             Today Margarita Mail, DO  [facility]   In 1 month End, Cristal Deer, MD [facility]

## 2023-10-08 ENCOUNTER — Ambulatory Visit: Payer: Self-pay | Admitting: *Deleted

## 2023-10-08 LAB — COMPLETE METABOLIC PANEL WITH GFR
AG Ratio: 1.4 (calc) (ref 1.0–2.5)
ALT: 8 U/L (ref 6–29)
AST: 12 U/L (ref 10–35)
Albumin: 4 g/dL (ref 3.6–5.1)
Alkaline phosphatase (APISO): 87 U/L (ref 37–153)
BUN: 17 mg/dL (ref 7–25)
CO2: 28 mmol/L (ref 20–32)
Calcium: 9.3 mg/dL (ref 8.6–10.4)
Chloride: 103 mmol/L (ref 98–110)
Creat: 0.67 mg/dL (ref 0.60–1.00)
Globulin: 2.8 g/dL (ref 1.9–3.7)
Glucose, Bld: 118 mg/dL (ref 65–139)
Potassium: 3.9 mmol/L (ref 3.5–5.3)
Sodium: 140 mmol/L (ref 135–146)
Total Bilirubin: 0.3 mg/dL (ref 0.2–1.2)
Total Protein: 6.8 g/dL (ref 6.1–8.1)
eGFR: 89 mL/min/{1.73_m2} (ref >=60)

## 2023-10-08 LAB — HEMOGLOBIN A1C
Hgb A1c MFr Bld: 7 %{Hb} — ABNORMAL HIGH (ref <5.7)
Mean Plasma Glucose: 154 mg/dL
eAG (mmol/L): 8.5 mmol/L

## 2023-10-08 LAB — CBC WITH DIFFERENTIAL/PLATELET
Absolute Lymphocytes: 1219 {cells}/uL (ref 850–3900)
Absolute Monocytes: 453 {cells}/uL (ref 200–950)
Basophils Absolute: 58 {cells}/uL (ref 0–200)
Basophils Relative: 0.8 %
Eosinophils Absolute: 110 {cells}/uL (ref 15–500)
Eosinophils Relative: 1.5 %
HCT: 42.8 % (ref 35.0–45.0)
Hemoglobin: 13.5 g/dL (ref 11.7–15.5)
MCH: 25.8 pg — ABNORMAL LOW (ref 27.0–33.0)
MCHC: 31.5 g/dL — ABNORMAL LOW (ref 32.0–36.0)
MCV: 81.7 fL (ref 80.0–100.0)
MPV: 11.4 fL (ref 7.5–12.5)
Monocytes Relative: 6.2 %
Neutro Abs: 5460 {cells}/uL (ref 1500–7800)
Neutrophils Relative %: 74.8 %
Platelets: 326 10*3/uL (ref 140–400)
RBC: 5.24 10*6/uL — ABNORMAL HIGH (ref 3.80–5.10)
RDW: 13.1 % (ref 11.0–15.0)
Total Lymphocyte: 16.7 %
WBC: 7.3 10*3/uL (ref 3.8–10.8)

## 2023-10-08 LAB — LIPID PANEL
Cholesterol: 223 mg/dL — ABNORMAL HIGH (ref <200)
HDL: 88 mg/dL (ref >=50)
LDL Cholesterol (Calc): 116 mg/dL — ABNORMAL HIGH
Non-HDL Cholesterol (Calc): 135 mg/dL — ABNORMAL HIGH (ref <130)
Total CHOL/HDL Ratio: 2.5 (calc) (ref <5.0)
Triglycerides: 87 mg/dL (ref <150)

## 2023-10-08 LAB — TSH: TSH: 0.08 m[IU]/L — ABNORMAL LOW (ref 0.40–4.50)

## 2023-10-08 MED ORDER — LEVOTHYROXINE SODIUM 112 MCG PO TABS
112.0000 ug | ORAL_TABLET | Freq: Every day | ORAL | 0 refills | Status: DC
Start: 2023-10-08 — End: 2024-01-01

## 2023-10-08 NOTE — Telephone Encounter (Signed)
Dose change.

## 2023-10-08 NOTE — Telephone Encounter (Signed)
Summary: med request   Pantaprazole 50 mg  ----- Message from [place] B sent at 10/08/2023  9:10 AM EST ----- Pt called in asking to be sent, pantaprzole for 30 days to protect her stomach, since she is taking antibiotics.         Chief Complaint: Medication Request Symptoms: None presently Frequency: NA Pertinent Negatives: Patient denies NA Disposition: [] ED /[] Urgent Care (no appt availability in office) / [] Appointment(In office/virtual)/ []  [place] [facility]/ [] Home Care/ [] Refused Recommended Disposition /[] [place] [facility]/ [x]  Follow-up with PCP Additional Notes:  Pt requesting Pantoprazole 40 mg , 30 day supply while on ATBs. States forgot to ask at appt yesterday. Reports has not been on ATBs for years and last time in 2018 took this med "So my stomach doesn't get messed up, I'm not used to taking meds. This ATB seems stronger than most too."  Also concerned she did take one of the ATB tabs this Am. "I forgot I was told to wait until CT scan.Just want Doctor to know." Also requesting advise on what she can eat, what she should not eat. Please advise.  Reason for Disposition  [1] Caller has medicine question about med NOT prescribed by PCP AND [2] triager unable to answer question (e.g., compatibility with other med, storage)  Answer Assessment - Initial Assessment Questions 1. NAME of MEDICINE: "What medicine(s) are you calling about?"     Pantoprazole. 2. QUESTION: "What is your question?" (e.g., double dose of medicine, side effect)     Would like prescribed while on ATBs  Protocols used: Medication Question Call-A-AH

## 2023-10-08 NOTE — Addendum Note (Signed)
Addended by: Margarita Mail on: 10/08/2023 12:39 PM   Modules accepted: Orders

## 2023-10-10 ENCOUNTER — Other Ambulatory Visit: Payer: Self-pay | Admitting: Internal Medicine

## 2023-10-10 DIAGNOSIS — R197 Diarrhea, unspecified: Secondary | ICD-10-CM

## 2023-10-10 DIAGNOSIS — R1032 Left lower quadrant pain: Secondary | ICD-10-CM

## 2023-10-10 MED ORDER — PANTOPRAZOLE SODIUM 20 MG PO TBEC
20.0000 mg | DELAYED_RELEASE_TABLET | Freq: Every day | ORAL | 0 refills | Status: DC | PRN
Start: 2023-10-10 — End: 2024-04-08

## 2023-10-17 ENCOUNTER — Ambulatory Visit
Admission: RE | Admit: 2023-10-17 | Discharge: 2023-10-17 | Disposition: A | Discharge status: Home / Self Care | Payer: Medicare Other | Source: Ambulatory Visit | Attending: Internal Medicine

## 2023-10-17 DIAGNOSIS — R1032 Left lower quadrant pain: Secondary | ICD-10-CM | POA: Insufficient documentation

## 2023-10-17 DIAGNOSIS — K5730 Diverticulosis of large intestine without perforation or abscess without bleeding: Secondary | ICD-10-CM | POA: Diagnosis not present

## 2023-10-17 DIAGNOSIS — R197 Diarrhea, unspecified: Secondary | ICD-10-CM | POA: Insufficient documentation

## 2023-10-17 DIAGNOSIS — N200 Calculus of kidney: Secondary | ICD-10-CM | POA: Diagnosis not present

## 2023-11-24 ENCOUNTER — Encounter: Payer: Self-pay | Admitting: Internal Medicine

## 2023-11-24 ENCOUNTER — Ambulatory Visit: Payer: Medicare Other | Admitting: Internal Medicine

## 2023-11-24 VITALS — BP 130/84 | HR 102 | Temp 98.1°F | Resp 18 | Ht 62.0 in

## 2023-11-24 DIAGNOSIS — R7309 Other abnormal glucose: Secondary | ICD-10-CM | POA: Diagnosis not present

## 2023-11-24 DIAGNOSIS — E039 Hypothyroidism, unspecified: Secondary | ICD-10-CM | POA: Diagnosis not present

## 2023-11-24 DIAGNOSIS — M62838 Other muscle spasm: Secondary | ICD-10-CM

## 2023-11-24 DIAGNOSIS — E7800 Pure hypercholesterolemia, unspecified: Secondary | ICD-10-CM

## 2023-11-24 MED ORDER — TIZANIDINE HCL 2 MG PO TABS
2.0000 mg | ORAL_TABLET | Freq: Every evening | ORAL | 0 refills | Status: DC | PRN
Start: 2023-11-24 — End: 2024-04-08

## 2023-11-24 NOTE — Patient Instructions (Addendum)
It was great seeing you today!  Plan discussed at today's visit: -Blood work ordered today, results will be uploaded to MyChart. Please return after March 17th for fasting labs, please fast 8-12 hours. Lab is open every weekday except holidays hours are 8:30-11:30 and 1:30-3:30.   Follow up in:   Take care and let us know if you have any questions or concerns prior to your next visit.  Dr. Caralee Ates

## 2023-11-24 NOTE — Progress Notes (Signed)
Established Patient Office Visit  Subjective   Patient ID: Annette Stevens, female    DOB: 24-Dec-1945  Age: 78 y.o. MRN: [id number]  Chief Complaint  Patient presents with   Medical Management of Chronic Issues    HPI  Patient is here for follow up on chronic medical conditions. Abdominal pain resolving, treated with Augmentin back in December, CT scan consistent with resolving diverticulitis.   Hypertension: -Medications: Nothing currently, had been on medications in the past but did not tolerate them. Doesn't remember which medications she was on. Does not want to be on medications.  -Blood pressure better controlled today  HLD: -Medications: Nothing -Last lipid panel: Lipid Panel     Component Value Date/Time   CHOL 223 (H) 10/07/2023 1133   TRIG 87 10/07/2023 1133   HDL 88 10/07/2023 1133   CHOLHDL 2.5 10/07/2023 1133   LDLCALC 116 (H) 10/07/2023 1133   The 10-year ASCVD risk score (Arnett DK, et al., 2019) is: 40.7%   Values used to calculate the score:     Age: 27 years     Sex: Female     Is Non-Hispanic African American: No     Diabetic: Yes     Tobacco smoker: No     Systolic Blood Pressure: 130 mmHg     Is BP treated: No     HDL Cholesterol: 88 mg/dL     Total Cholesterol: 223 mg/dL  Elevated X9J: -Last Y7W 12/24 7.0% -Medications: Nothing  Hypothyroidism: -Medications: Levothyroxine decreased to 112 mcg -Patient is compliant with the above medication (s) at the above dose and reports no medication side effects.   -Last TSH: 12/24 0.08  Health Maintenance: -Blood work due   Patient Active Problem List   Diagnosis Date Noted   Muscle weakness 04/17/2021   Lung nodules 12/17/2018   Spondylolysis 11/18/2018   Sciatica of left side 11/18/2018   Chronic right shoulder pain 11/18/2018   Hallux valgus of right foot 11/18/2018   Spondylolisthesis at L4-L5 level 11/18/2018   Spondylolisthesis 12/10/2017   Osteoarthritis of knee 12/10/2017   Degenerative  joint disease of shoulder region 12/10/2017   Grieving 12/10/2017   Lipoma of left lower extremity 12/10/2017   Sebaceous cyst 12/10/2017   Cystocele, midline 04/15/2017   Prolapse of vaginal vault 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 11/2022 Zio: Predominantly sinus rhythm (average 80, range 52-182).  3 SVT runs-fastest 4 beats at 182 bpm, longest 17.9 seconds at 148 bpm.  Rare isolated PACs and PVCs.   Past Surgical History:  Procedure Laterality Date   BUNIONECTOMY Left 2006   ETHMOIDECTOMY Left 02/24/2018   Procedure: ETHMOIDECTOMY;  Surgeon: Geanie Logan, MD;  Location: [facility];  Service: ENT;  Laterality: Left;   FRONTAL SINUS EXPLORATION Left 02/24/2018   Procedure: FRONTAL SINUS EXPLORATION;  Surgeon: Geanie Logan, MD;  Location: [facility];  Service: ENT;  Laterality: Left;   IMAGE GUIDED SINUS SURGERY N/A 02/24/2018   Procedure: IMAGE GUIDED SINUS SURGERY;  Surgeon: Geanie Logan, MD;  Location: [facility];  Service: ENT;  Laterality: N/A;  GAVE DISK BRENDA 3-21 KP   MAXILLARY ANTROSTOMY Left 02/24/2018   Procedure: ENDSOCPIC MAXILLARY ANTROSTOMY TOTAL;  Surgeon: Willeen Cass,  Renae Fickle, MD;  Location: [facility];  Service: ENT;  Laterality: Left;   SPHENOIDECTOMY Left 02/24/2018   Procedure: SPHENOIDECTOMY;  Surgeon: Geanie Logan, MD;  Location: [facility];  Service: ENT;  Laterality: Left;   TUBAL LIGATION  1969   VAGINAL HYSTERECTOMY  1980   partial still has ovaries   Social History   Tobacco Use   Smoking status: Former    Current packs/day: 0.00    Types: Cigarettes    Quit date: 1995    Years since quitting: 30.1   Smokeless tobacco: Never   Vaping Use   Vaping status: Never Used  Substance Use Topics   Alcohol use: Not Currently    Alcohol/week: 1.0 standard drink of alcohol    Types: 1 Glasses of wine per week    Comment: occasional glass of wine   Drug use: No   Social History   Socioeconomic History   Marital status: Widowed    Spouse name: Not on file   Number of children: 2   Years of education: Not on file   Highest education level: High school graduate  Occupational History   Not on file  Tobacco Use   Smoking status: Former    Current packs/day: 0.00    Types: Cigarettes    Quit date: 49    Years since quitting: 30.1   Smokeless tobacco: Never  Vaping Use   Vaping status: Never Used  Substance and Sexual Activity   Alcohol use: Not Currently    Alcohol/week: 1.0 standard drink of alcohol    Types: 1 Glasses of wine per week    Comment: occasional glass of wine   Drug use: No   Sexual activity: Not Currently  Other Topics Concern   Not on file  Social History Narrative   Not on file   Social Drivers of Health   Financial Resource Strain: Low Risk  (05/14/2023)   Received from [facility]   Overall Financial Resource Strain (CARDIA)    Difficulty of Paying Living Expenses: Not hard at all  Food Insecurity: No Food Insecurity (05/14/2023)   Received from [facility]   Hunger Vital Sign    Worried About Running Out of Food in the Last Year: Never true    Ran Out of Food in the Last Year: Never true  Recent Concern: Food Insecurity - Food Insecurity Present (04/23/2023)   Hunger Vital Sign    Worried About Running Out of Food in the Last Year: Sometimes true    Ran Out of Food in the Last Year: Never true  Transportation Needs: No Transportation Needs (06/19/2023)   Received from [facility]   PRAPARE - Transportation    In the past 12 months, has lack of transportation kept you from medical appointments or from getting medications?: No     Lack of Transportation (Non-Medical): No  Recent Concern: Transportation Needs - Unmet Transportation Needs (05/14/2023)   Received from [facility] - Transportation    In the past 12 months, has lack of transportation kept you from medical appointments or from getting medications?: Yes    Lack of Transportation (Non-Medical): No  Physical Activity: Insufficiently Active (05/14/2023)   Received from [facility]   Exercise Vital Sign    Days of Exercise per Week: 3 days    Minutes of Exercise per Session: 30 min  Stress: Stress Concern Present (05/14/2023)   Received from  [facility]   [employer] of Occupational Health - Occupational Stress Questionnaire    Feeling of Stress : Rather much  Social Connections: Socially Isolated (05/14/2023)   Received from [facility]   Social Connection and Isolation Panel [NHANES]    Frequency of Communication with Friends and Family: More than three times a week    Frequency of Social Gatherings with Friends and Family: Not on file    Attends Religious Services: Never    Database administrator or Organizations: No    Attends Banker Meetings: Never    Marital Status: Widowed  Intimate Partner Violence: Not At Risk (12/31/2018)   Humiliation, Afraid, Rape, and Kick questionnaire    Fear of Current or Ex-Partner: No    Emotionally Abused: No    Physically Abused: No    Sexually Abused: No   Family Status  Relation Name Status   Mother  Deceased   Father  Deceased  No partnership data on file   Family History  Problem Relation Age of Onset   Dementia Mother    Cancer Mother        colon   Hypertension Father    Allergies  Allergen Reactions   Ace Inhibitors     Other reaction(s): Headache Blood pressure medications; Headaches, leg cramps, etc.   Beta Adrenergic Blockers Other (See Comments)    Blood pressure medications; Headaches, leg  cramps, etc.   Calcium Channel Blockers Other (See Comments)    Pt is unsure just she cannot take them    Other Other (See Comments)    Blood pressure medications      Review of Systems  All other systems reviewed and are negative.     Objective:     BP 130/84 (Cuff Size: Normal)   Pulse (!) 102   Temp 98.1 F (36.7 C) (Oral)   Resp 18   Ht 5\' 2"  (1.575 m)   SpO2 99%   BMI 23.69 kg/m  BP Readings from Last 3 Encounters:  11/24/23 130/84  10/07/23 (!) 142/86  06/12/23 (!) 158/103   Wt Readings from Last 3 Encounters:  10/07/23 129 lb 8 oz (58.7 kg)  06/12/23 125 lb 9.6 oz (57 kg)  04/10/23 123 lb 0.3 oz (55.8 kg)      Physical Exam Constitutional:      Appearance: Normal appearance.  HENT:     Head: Normocephalic and atraumatic.  Eyes:     Conjunctiva/sclera: Conjunctivae normal.  Cardiovascular:     Rate and Rhythm: Normal rate and regular rhythm.  Pulmonary:     Effort: Pulmonary effort is normal.     Breath sounds: Normal breath sounds.  Skin:    General: Skin is warm and dry.  Neurological:     General: No focal deficit present.     Mental Status: She is alert. Mental status is at baseline.  Psychiatric:        Mood and Affect: Mood normal.        Behavior: Behavior normal.      No results found for any visits on 11/24/23.  Last CBC Lab Results  Component Value Date   WBC 7.3 10/07/2023   HGB 13.5 10/07/2023   HCT 42.8 10/07/2023   MCV 81.7 10/07/2023   MCH 25.8 (L) 10/07/2023   RDW 13.1 10/07/2023   PLT 326 10/07/2023   Last metabolic panel Lab Results  Component Value Date   GLUCOSE 118 10/07/2023  NA 140 10/07/2023   K 3.9 10/07/2023   CL 103 10/07/2023   CO2 28 10/07/2023   BUN 17 10/07/2023   CREATININE 0.67 10/07/2023   EGFR 89 10/07/2023   CALCIUM 9.3 10/07/2023   PROT 6.8 10/07/2023   ALBUMIN 4.1 08/01/2022   LABGLOB 2.7 08/01/2022   AGRATIO 1.5 08/01/2022   BILITOT 0.3 10/07/2023   ALKPHOS 98 08/01/2022   AST 12  10/07/2023   ALT 8 10/07/2023   ANIONGAP 10 02/12/2023   Last lipids Lab Results  Component Value Date   CHOL 223 (H) 10/07/2023   HDL 88 10/07/2023   LDLCALC 116 (H) 10/07/2023   TRIG 87 10/07/2023   CHOLHDL 2.5 10/07/2023   Last hemoglobin A1c Lab Results  Component Value Date   HGBA1C 7.0 (H) 10/07/2023   Last thyroid functions Lab Results  Component Value Date   TSH 0.08 (L) 10/07/2023   T4TOTAL 11.7 10/11/2022   Last vitamin D No results found for: "25OHVITD2", "25OHVITD3", "VD25OH" Last vitamin B12 and Folate No results found for: "VITAMINB12", "FOLATE"    The 10-year ASCVD risk score (Arnett DK, et al., 2019) is: 40.7%    Assessment & Plan:   1. Hypothyroidism, unspecified type (Primary): Now on Levothyroxine 112 mcg, plan to repeat TSH today however patient wants to wait, will order it for March.   - TSH; Future  2. Elevated hemoglobin A1c: Patient not interested in medications, will recheck A1c and fasting glucose in 3 months. Discussed diet recommendations.  - HgB A1c; Future - Glucose; Future  3. Pure hypercholesterolemia: Discussed that LDL was elevated and ASCVD risk 40%. Patient does not want to start statins.   4. Muscle spasm: Prescribe low dose muscle relaxer to take as needed.   - tiZANidine (ZANAFLEX) 2 MG tablet; Take 1 tablet (2 mg total) by mouth at bedtime as needed for muscle spasms.  Dispense: 30 tablet; Refill: 0   Return for will call to schedule.    Margarita Mail, DO

## 2023-12-01 ENCOUNTER — Ambulatory Visit: Payer: Medicare Other | Admitting: Internal Medicine

## 2023-12-24 ENCOUNTER — Ambulatory Visit: Payer: Medicare Other | Admitting: Internal Medicine

## 2023-12-31 ENCOUNTER — Other Ambulatory Visit: Payer: Self-pay | Admitting: Internal Medicine

## 2023-12-31 DIAGNOSIS — E039 Hypothyroidism, unspecified: Secondary | ICD-10-CM

## 2023-12-31 NOTE — Telephone Encounter (Signed)
 Copied from CRM [id number]. Topic: Clinical - Medication Refill >> Dec 31, 2023 10:28 AM Bobbye Morton wrote: Most Recent Primary Care Visit:  Provider: Margarita Mail  Department: [facility]  Visit Type: OFFICE VISIT  Date: 11/24/2023  Medication: meloxicam (MOBIC) 15 MG tablet  Has the patient contacted their pharmacy? No (Agent: If no, request that the patient contact the pharmacy for the refill. If patient does not wish to contact the pharmacy document the reason why and proceed with request.) (Agent: If yes, when and what did the pharmacy advise?)  Is this the correct pharmacy for this prescription? Yes If no, delete pharmacy and type the correct one.  This is the patient's preferred pharmacy:  [facility] [address] [place] - [address] [place] [address] Phone: [phone] Fax: [phone]   Has the prescription been filled recently? No  Is the patient out of the medication? Yes  Has the patient been seen for an appointment in the last year OR does the patient have an upcoming appointment? Yes  Can we respond through MyChart? Yes  Agent: Please be advised that Rx refills may take up to 3 business days. We ask that you follow-up with your pharmacy.

## 2024-01-01 NOTE — Telephone Encounter (Signed)
 Requested Prescriptions  Pending Prescriptions Disp Refills   levothyroxine (SYNTHROID) 112 MCG tablet [Pharmacy Med Name: Levothyroxine Sodium 112 MCG Oral Tablet] 90 tablet 0    Sig: Take 1 tablet by mouth once daily     Endocrinology:  Hypothyroid Agents Failed - 01/01/2024  8:00 AM      Failed - TSH in normal range and within 360 days    TSH  Date Value Ref Range Status  10/07/2023 0.08 (L) 0.40 - 4.50 mIU/L Final         Passed - Valid encounter within last 12 months    Recent Outpatient Visits           2 months ago LLQ pain    [facility] Margarita Mail, DO   1 year ago Irregular heartbeat   [facility] Margarita Mail, DO   1 year ago Hypothyroidism, unspecified type   [facility] Margarita Mail, DO   1 year ago Osteoarthritis of both knees, unspecified osteoarthritis type   [facility] Margarita Mail, DO   1 year ago Chronic diarrhea   [facility] Margarita Mail, [place]

## 2024-01-02 ENCOUNTER — Other Ambulatory Visit: Payer: Self-pay

## 2024-01-02 NOTE — Telephone Encounter (Signed)
 Copied from CRM [id number]. Topic: Clinical - Prescription Issue >> Jan 02, 2024  9:43 AM Victorino Dike T wrote: Reason for CRM: levothyroxine (SYNTHROID) 112 MCG tablet pharmacy states they did not receive the order also still waiting for the meloxicam (MOBIC) 15 MG tablet- please call patient [phone]

## 2024-03-22 DIAGNOSIS — I781 Nevus, non-neoplastic: Secondary | ICD-10-CM | POA: Diagnosis not present

## 2024-03-22 DIAGNOSIS — L738 Other specified follicular disorders: Secondary | ICD-10-CM | POA: Diagnosis not present

## 2024-03-22 DIAGNOSIS — Z86018 Personal history of other benign neoplasm: Secondary | ICD-10-CM | POA: Diagnosis not present

## 2024-03-22 DIAGNOSIS — L570 Actinic keratosis: Secondary | ICD-10-CM | POA: Diagnosis not present

## 2024-03-22 DIAGNOSIS — L578 Other skin changes due to chronic exposure to nonionizing radiation: Secondary | ICD-10-CM | POA: Diagnosis not present

## 2024-03-22 DIAGNOSIS — Z872 Personal history of diseases of the skin and subcutaneous tissue: Secondary | ICD-10-CM | POA: Diagnosis not present

## 2024-03-22 DIAGNOSIS — L821 Other seborrheic keratosis: Secondary | ICD-10-CM | POA: Diagnosis not present

## 2024-03-23 ENCOUNTER — Other Ambulatory Visit: Payer: Self-pay

## 2024-03-23 ENCOUNTER — Other Ambulatory Visit: Payer: Self-pay | Admitting: Internal Medicine

## 2024-03-23 ENCOUNTER — Encounter: Payer: Self-pay | Admitting: Internal Medicine

## 2024-03-23 ENCOUNTER — Ambulatory Visit (INDEPENDENT_AMBULATORY_CARE_PROVIDER_SITE_OTHER): Admitting: Internal Medicine

## 2024-03-23 VITALS — BP 120/84 | HR 98 | Temp 98.2°F | Resp 16 | Ht 62.0 in | Wt 128.4 lb

## 2024-03-23 DIAGNOSIS — E039 Hypothyroidism, unspecified: Secondary | ICD-10-CM | POA: Diagnosis not present

## 2024-03-23 DIAGNOSIS — R109 Unspecified abdominal pain: Secondary | ICD-10-CM

## 2024-03-23 DIAGNOSIS — R7309 Other abnormal glucose: Secondary | ICD-10-CM | POA: Diagnosis not present

## 2024-03-23 DIAGNOSIS — R1011 Right upper quadrant pain: Secondary | ICD-10-CM | POA: Diagnosis not present

## 2024-03-23 NOTE — Progress Notes (Signed)
 Acute Office Visit  Subjective:     Patient ID: Annette Stevens, female    DOB: [birth year], 79 y.o.   MRN: [id number]  Chief Complaint  Patient presents with   Consult    Possible gallbladder flare    HPI Patient is in today for abdominal discomfort and upper back/shoulder blade pain.  Discussed the use of AI scribe software for clinical note transcription with the patient, who gave verbal consent to proceed.  History of Present Illness [name] "Evertt Hoe" is a 78 year old female who presents with severe upper back pain and abdominal discomfort.  She experiences severe pain across her upper back, describing it as 'all the way across' and 'severe'. The pain was intense enough to prompt her to take Advil, which provided relief approximately two hours later. She also experiences abdominal discomfort, in the middle part of the upper abdomen, which has occurred once and was severe enough to cause concern. She reports a general feeling of not being well, with no appetite and low energy levels. She has not been sleeping well, waking up at 3:40 AM and unable to return to sleep, resulting in only about four hours of sleep.   Review of Systems  Constitutional:  Positive for malaise/fatigue.  Gastrointestinal:  Positive for abdominal pain. Negative for nausea and vomiting.  Musculoskeletal:  Positive for back pain.        Objective:    BP 120/84 (Cuff Size: Normal)   Pulse 98   Temp 98.2 F (36.8 C) (Oral)   Resp 16   Ht 5\' 2"  (1.575 m)   Wt 128 lb 6.4 oz (58.2 kg)   SpO2 98%   BMI 23.48 kg/m  BP Readings from Last 3 Encounters:  03/23/24 120/84  11/24/23 130/84  10/07/23 (!) 142/86   Wt Readings from Last 3 Encounters:  03/23/24 128 lb 6.4 oz (58.2 kg)  10/07/23 129 lb 8 oz (58.7 kg)  06/12/23 125 lb 9.6 oz (57 kg)      Physical Exam Constitutional:      Appearance: Normal appearance.  HENT:     Head: Normocephalic and atraumatic.  Eyes:     Conjunctiva/sclera:  Conjunctivae normal.  Cardiovascular:     Rate and Rhythm: Normal rate and regular rhythm.  Pulmonary:     Effort: Pulmonary effort is normal.     Breath sounds: Normal breath sounds.  Abdominal:     General: Bowel sounds are normal. There is no distension.     Palpations: Abdomen is soft.     Tenderness: There is abdominal tenderness. There is no guarding or rebound.     Comments: Ptp in epigastric and RUQ   Skin:    General: Skin is warm and dry.  Neurological:     General: No focal deficit present.     Mental Status: She is alert. Mental status is at baseline.  Psychiatric:        Mood and Affect: Mood normal.        Behavior: Behavior normal.     No results found for any visits on 03/23/24.      Assessment & Plan:   Assessment & Plan Right upper quadrant pain Severe pain radiating to back, relieved by Advil. Differential includes cholecystitis, gallstones, GERD or pancreatitis. - Order right upper quadrant ultrasound to evaluate gallbladder. - Ordered CMP, lipase.   - US  Abdomen Limited RUQ (LIVER/GB); Future -CMET -Lipase    Return if symptoms worsen or fail to improve.  Rockney Cid, DO

## 2024-03-24 ENCOUNTER — Ambulatory Visit: Payer: Self-pay | Admitting: Internal Medicine

## 2024-03-24 DIAGNOSIS — E039 Hypothyroidism, unspecified: Secondary | ICD-10-CM

## 2024-03-24 LAB — COMPREHENSIVE METABOLIC PANEL WITH GFR
AG Ratio: 1.4 (calc) (ref 1.0–2.5)
ALT: 10 U/L (ref 6–29)
AST: 13 U/L (ref 10–35)
Albumin: 4.3 g/dL (ref 3.6–5.1)
Alkaline phosphatase (APISO): 82 U/L (ref 37–153)
BUN: 13 mg/dL (ref 7–25)
CO2: 30 mmol/L (ref 20–32)
Calcium: 9.7 mg/dL (ref 8.6–10.4)
Chloride: 101 mmol/L (ref 98–110)
Creat: 0.76 mg/dL (ref 0.60–1.00)
Globulin: 3.1 g/dL (ref 1.9–3.7)
Glucose, Bld: 125 mg/dL — ABNORMAL HIGH (ref 65–99)
Potassium: 4.3 mmol/L (ref 3.5–5.3)
Sodium: 141 mmol/L (ref 135–146)
Total Bilirubin: 0.5 mg/dL (ref 0.2–1.2)
Total Protein: 7.4 g/dL (ref 6.1–8.1)
eGFR: 80 mL/min/{1.73_m2} (ref >=60)

## 2024-03-24 LAB — HEMOGLOBIN A1C
Hgb A1c MFr Bld: 7.3 % — ABNORMAL HIGH (ref <5.7)
Mean Plasma Glucose: 163 mg/dL
eAG (mmol/L): 9 mmol/L

## 2024-03-24 LAB — TSH: TSH: 0.1 m[IU]/L — ABNORMAL LOW (ref 0.40–4.50)

## 2024-03-24 LAB — LIPASE: Lipase: 26 U/L (ref 7–60)

## 2024-03-24 MED ORDER — LEVOTHYROXINE SODIUM 100 MCG PO TABS: 100.0000 ug | ORAL_TABLET | Freq: Every day | ORAL | 0 refills | Status: DC

## 2024-03-25 ENCOUNTER — Other Ambulatory Visit: Payer: Self-pay | Admitting: Internal Medicine

## 2024-03-25 DIAGNOSIS — E039 Hypothyroidism, unspecified: Secondary | ICD-10-CM

## 2024-03-25 DIAGNOSIS — R7309 Other abnormal glucose: Secondary | ICD-10-CM

## 2024-03-29 ENCOUNTER — Ambulatory Visit
Admission: RE | Admit: 2024-03-29 | Discharge: 2024-03-29 | Disposition: A | Discharge status: Home / Self Care | Source: Ambulatory Visit | Attending: Internal Medicine | Admitting: Internal Medicine

## 2024-03-29 ENCOUNTER — Other Ambulatory Visit

## 2024-03-29 DIAGNOSIS — R1011 Right upper quadrant pain: Secondary | ICD-10-CM | POA: Insufficient documentation

## 2024-03-29 DIAGNOSIS — K8020 Calculus of gallbladder without cholecystitis without obstruction: Secondary | ICD-10-CM | POA: Diagnosis not present

## 2024-03-29 DIAGNOSIS — R1013 Epigastric pain: Secondary | ICD-10-CM | POA: Diagnosis not present

## 2024-03-30 ENCOUNTER — Ambulatory Visit: Payer: Self-pay | Admitting: Internal Medicine

## 2024-03-30 DIAGNOSIS — R1011 Right upper quadrant pain: Secondary | ICD-10-CM

## 2024-03-30 DIAGNOSIS — R109 Unspecified abdominal pain: Secondary | ICD-10-CM

## 2024-03-30 DIAGNOSIS — K8020 Calculus of gallbladder without cholecystitis without obstruction: Secondary | ICD-10-CM

## 2024-04-05 NOTE — Telephone Encounter (Signed)
 Copied from CRM [id number]. Topic: General - Other >> Apr 05, 2024 10:53 AM Oddis Bench wrote: Reason for CRM: Patient is calling to have [facility] to send referral for gall bladder removal, she would like to have a call back.

## 2024-04-08 ENCOUNTER — Ambulatory Visit (INDEPENDENT_AMBULATORY_CARE_PROVIDER_SITE_OTHER): Admitting: General Surgery

## 2024-04-08 ENCOUNTER — Encounter: Payer: Self-pay | Admitting: General Surgery

## 2024-04-08 VITALS — BP 174/112 | HR 120 | Temp 98.5°F | Ht 62.0 in | Wt 126.0 lb

## 2024-04-08 DIAGNOSIS — K8050 Calculus of bile duct without cholangitis or cholecystitis without obstruction: Secondary | ICD-10-CM

## 2024-04-08 DIAGNOSIS — K8020 Calculus of gallbladder without cholecystitis without obstruction: Secondary | ICD-10-CM

## 2024-04-08 NOTE — Patient Instructions (Signed)
 You have requested to have your gallbladder removed. This will be done at [facility] with Dr. Maurine Minister.  If you are on any injectable weight loss medication, you will need to stop taking your GLP-1 injectable (weight loss) medications 8 days before your surgery to avoid any complications with anesthesia.   You will most likely be out of work 1-2 weeks for this surgery.  If you have FMLA or disability paperwork that needs filled out you may drop this off at our office or this can be faxed to [phone].  You will return after your post-op appointment with a lifting restriction for approximately 4 more weeks.  You will be able to eat anything you would like to following surgery. But, start by eating a bland diet and advance this as tolerated. The Gallbladder diet is below, please go as closely by this diet as possible prior to surgery to avoid any further attacks.  Please see the (blue)pre-care form that you have been given today. Our surgery scheduler will call you to verify surgery date and to go over information.   If you have any questions, please call our office.  Laparoscopic Cholecystectomy Laparoscopic cholecystectomy is surgery to remove the gallbladder. The gallbladder is located in the upper right part of the abdomen, behind the liver. It is a storage sac for bile, which is produced in the liver. Bile aids in the digestion and absorption of fats. Cholecystectomy is often done for inflammation of the gallbladder (cholecystitis). This condition is usually caused by a buildup of gallstones (cholelithiasis) in the gallbladder. Gallstones can block the flow of bile, and that can result in inflammation and pain. In severe cases, emergency surgery may be required. If emergency surgery is not required, you will have time to prepare for the procedure. Laparoscopic surgery is an alternative to open surgery. Laparoscopic surgery has a shorter recovery time. Your common bile duct may also need  to be examined during the procedure. If stones are found in the common bile duct, they may be removed. LET [facility] CARE PROVIDER KNOW ABOUT: Any allergies you have. All medicines you are taking, including vitamins, herbs, eye drops, creams, and over-the-counter medicines. Previous problems you or members of your family have had with the use of anesthetics. Any blood disorders you have. Previous surgeries you have had.  Any medical conditions you have. RISKS AND COMPLICATIONS Generally, this is a safe procedure. However, problems may occur, including: Infection. Bleeding. Allergic reactions to medicines. Damage to other structures or organs. A stone remaining in the common bile duct. A bile leak from the cyst duct that is clipped when your gallbladder is removed. The need to convert to open surgery, which requires a larger incision in the abdomen. This may be necessary if your surgeon thinks that it is not safe to continue with a laparoscopic procedure. BEFORE THE PROCEDURE Ask your health care provider about: Changing or stopping your regular medicines. This is especially important if you are taking diabetes medicines or blood thinners. Taking medicines such as aspirin and ibuprofen. These medicines can thin your blood. Do not take these medicines before your procedure if your health care provider instructs you not to. Follow instructions from your health care provider about eating or drinking restrictions. Let your health care provider know if you develop a cold or an infection before surgery. Plan to have someone take you home after the procedure. Ask your health care provider how your surgical site will be marked or identified. You may  be given antibiotic medicine to help prevent infection. PROCEDURE To reduce your risk of infection: Your health care team will wash or sanitize their hands. Your skin will be washed with soap. An IV tube may be inserted into one of your  veins. You will be given a medicine to make you fall asleep (general anesthetic). A breathing tube will be placed in your mouth. The surgeon will make several small cuts (incisions) in your abdomen. A thin, lighted tube (laparoscope) that has a tiny camera on the end will be inserted through one of the small incisions. The camera on the laparoscope will send a picture to a TV screen (monitor) in the operating room. This will give the surgeon a good view inside your abdomen. A gas will be pumped into your abdomen. This will expand your abdomen to give the surgeon more room to perform the surgery. Other tools that are needed for the procedure will be inserted through the other incisions. The gallbladder will be removed through one of the incisions. After your gallbladder has been removed, the incisions will be closed with stitches (sutures), staples, or skin glue. Your incisions may be covered with a bandage (dressing). The procedure may vary among health care providers and hospitals. AFTER THE PROCEDURE Your blood pressure, heart rate, breathing rate, and blood oxygen level will be monitored often until the medicines you were given have worn off. You will be given medicines as needed to control your pain.   This information is not intended to replace advice given to you by your health care provider. Make sure you discuss any questions you have with your health care provider.   Document Released: 10/07/2005 Document Revised: 06/28/2015 Document Reviewed: 05/19/2013 Elsevier Interactive Patient Education 2016 [employer]   Low-Fat Diet for Gallbladder Conditions A low-fat diet can be helpful if you have pancreatitis or a gallbladder condition. With these conditions, your pancreas and gallbladder have trouble digesting fats. A healthy eating plan with less fat will help rest your pancreas and gallbladder and reduce your symptoms. WHAT DO I NEED TO KNOW ABOUT THIS DIET? Eat a low-fat  diet. Reduce your fat intake to less than 20-30% of your total daily calories. This is less than 50-60 g of fat per day. Remember that you need some fat in your diet. Ask your dietician what your daily goal should be. Choose nonfat and low-fat healthy foods. Look for the words "nonfat," "low fat," or "fat free." As a guide, look on the label and choose foods with less than 3 g of fat per serving. Eat only one serving. Avoid alcohol. Do not smoke. If you need help quitting, talk with your health care provider. Eat small frequent meals instead of three large heavy meals. WHAT FOODS CAN I EAT? Grains Include healthy grains and starches such as potatoes, wheat bread, fiber-rich cereal, and brown rice. Choose whole grain options whenever possible. In adults, whole grains should account for 45-65% of your daily calories.  Fruits and Vegetables Eat plenty of fruits and vegetables. Fresh fruits and vegetables add fiber to your diet. Meats and Other Protein Sources Eat lean meat such as chicken and pork. Trim any fat off of meat before cooking it. Eggs, fish, and beans are other sources of protein. In adults, these foods should account for 10-35% of your daily calories. Dairy Choose low-fat milk and dairy options. Dairy includes fat and protein, as well as calcium.  Fats and Oils Limit high-fat foods such as fried foods, sweets, baked  goods, sugary drinks.  Other Creamy sauces and condiments, such as mayonnaise, can add extra fat. Think about whether or not you need to use them, or use smaller amounts or low fat options. WHAT FOODS ARE NOT RECOMMENDED? High fat foods, such as: [employer]. Ice cream. [place] toast. Sweet rolls. Pizza. Cheese bread. Foods covered with batter, butter, creamy sauces, or cheese. Fried foods. Sugary drinks and desserts. Foods that cause gas or bloating   This information is not intended to replace advice given to you by your health care provider. Make sure you  discuss any questions you have with your health care provider.   Document Released: 10/12/2013 Document Reviewed: 10/12/2013 Elsevier Interactive Patient Education [employer]

## 2024-04-11 ENCOUNTER — Ambulatory Visit: Payer: Self-pay | Admitting: General Surgery

## 2024-04-11 DIAGNOSIS — K8050 Calculus of bile duct without cholangitis or cholecystitis without obstruction: Secondary | ICD-10-CM

## 2024-04-11 NOTE — Progress Notes (Signed)
 Patient ID: Annette Stevens, female   DOB: 08-15-1946, 78 y.o.   MRN: [id number] CC: Biliary Colic History of Present Illness Annette Stevens is a 78 y.o. female with .  Medical history as below who presents in consultation for right upper quadrant pain.  She reports that the pain happens about 4 times a day and describes it as dull aching and cramping pain.  She says that it is in her epigastrium and right upper quadrant and the other day had a very severe attack of this.  She denies any jaundice, she denies any fevers or chills.  She does report that she has some heartburn with this.  She reports that she had an ultrasound that showed cholelithiasis.  She also says that she has a lot of right shoulder pain and pain in between her shoulder blades.  She denies that this has any temporality with eating foods.  Past Medical History Past Medical History:  Diagnosis Date   Hypertension    Hypokalemia    Hypothyroidism    Premature atrial contractions    PSVT (paroxysmal supraventricular tachycardia) (HCC)    a. 11/2022 Zio: Predominantly sinus rhythm (average 80, range 52-182).  3 SVT runs-fastest 4 beats at 182 bpm, longest 17.9 seconds at 148 bpm.  Rare isolated PACs and PVCs.       Past Surgical History:  Procedure Laterality Date   BUNIONECTOMY Left 2006   ETHMOIDECTOMY Left 02/24/2018   Procedure: ETHMOIDECTOMY;  Surgeon: Blair Mt, MD;  Location: [facility];  Service: ENT;  Laterality: Left;   FRONTAL SINUS EXPLORATION Left 02/24/2018   Procedure: FRONTAL SINUS EXPLORATION;  Surgeon: Blair Mt, MD;  Location: [facility];  Service: ENT;  Laterality: Left;   IMAGE GUIDED SINUS SURGERY N/A 02/24/2018   Procedure: IMAGE GUIDED SINUS SURGERY;  Surgeon: Blair Mt, MD;  Location: [facility];  Service: ENT;  Laterality: N/A;  GAVE DISK BRENDA 3-21 KP   MAXILLARY ANTROSTOMY Left 02/24/2018   Procedure: ENDSOCPIC MAXILLARY ANTROSTOMY TOTAL;  Surgeon: Blair Mt,  MD;  Location: [facility];  Service: ENT;  Laterality: Left;   SPHENOIDECTOMY Left 02/24/2018   Procedure: CLEONE;  Surgeon: Blair Mt, MD;  Location: [facility];  Service: ENT;  Laterality: Left;   TUBAL LIGATION  1969   VAGINAL HYSTERECTOMY  1980   partial still has ovaries    Allergies  Allergen Reactions   Ace Inhibitors     Other reaction(s): Headache Blood pressure medications; Headaches, leg cramps, etc.   Beta Adrenergic Blockers Other (See Comments)    Blood pressure medications; Headaches, leg cramps, etc.   Calcium Channel Blockers Other (See Comments)    Pt is unsure just she cannot take them    Other Other (See Comments)    Blood pressure medications    Current Outpatient Medications  Medication Sig Dispense Refill   cholecalciferol (VITAMIN D3) 25 MCG (1000 UNIT) tablet Take 1,000 Units by mouth daily.     levothyroxine  (SYNTHROID ) 100 MCG tablet Take 1 tablet (100 mcg total) by mouth daily. 90 tablet 0   No current facility-administered medications for this visit.    Family History Family History  Problem Relation Age of Onset   Dementia Mother    Cancer Mother        colon   Hypertension Father        Social History Social History   Tobacco Use   Smoking status: Former    Current packs/day: 0.00  Types: Cigarettes    Quit date: 26    Years since quitting: 30.4   Smokeless tobacco: Never  Vaping Use   Vaping status: Never Used  Substance Use Topics   Alcohol use: Not Currently    Alcohol/week: 1.0 standard drink of alcohol    Types: 1 Glasses of wine per week    Comment: occasional glass of wine   Drug use: No        ROS Full ROS of systems performed and is otherwise negative there than what is stated in the HPI  Physical Exam Blood pressure (!) 174/112, pulse (!) 120, temperature 98.5 F (36.9 C), temperature source Oral, height 5' 2 (1.575 m), weight 126 lb (57.2 kg), SpO2 94%.  Alert and oriented  x 3, normal work of breathing on room air, sinus tachycardia, abdomen soft, nontender and nondistended, negative Murphy sign, pain with movement of her left shoulder.  Data Reviewed Ultrasound reviewed and significant for cholelithiasis no evidence of cholecystitis, bilirubin is normal on labs.  I have personally reviewed the patient's imaging and medical records.    Assessment/Plan    Patient with right upper quadrant pain and mid back pain who has an ultrasound that shows cholelithiasis.  She also has right shoulder pain that she is attributing to her gallbladder.  Discussed with her that some of her symptoms may be related to her gallbladder but not all of them can definitely be attributed to this pathology.  I discussed with her that I am willing to take her gallbladder out if she knows that she may still have some of the musculoskeletal pain.  She understands this and wants to proceed with cholecystectomy.  Discussed the risk, benefits alternatives the procedure including risk infection, bleeding, bile leak, retained stone and injury to common bile duct.  She stands these risks and wishes to proceed with surgery   Jayson MALVA Endow

## 2024-04-15 ENCOUNTER — Telehealth: Payer: Self-pay | Admitting: General Surgery

## 2024-04-15 NOTE — Telephone Encounter (Signed)
 Left message for patient to call back.  Calling to see if she is ready to schedule surgery with Dr. Marinda.

## 2024-04-27 ENCOUNTER — Telehealth: Payer: Self-pay | Admitting: General Surgery

## 2024-04-27 NOTE — Telephone Encounter (Signed)
 Patient calls and said right now she is doing really good, no problems.  So wants to hold off for now with scheduling of surgery for cholecystectomy.  Patient last saw Dr. Marinda in office on 04/08/24.  Will need follow up in office if decides to schedule surgery so that we can re-evaluate.

## 2024-05-28 DIAGNOSIS — Z471 Aftercare following joint replacement surgery: Secondary | ICD-10-CM | POA: Diagnosis not present

## 2024-05-28 DIAGNOSIS — E1169 Type 2 diabetes mellitus with other specified complication: Secondary | ICD-10-CM | POA: Diagnosis not present

## 2024-05-28 DIAGNOSIS — I10 Essential (primary) hypertension: Secondary | ICD-10-CM | POA: Diagnosis not present

## 2024-05-28 DIAGNOSIS — Z96642 Presence of left artificial hip joint: Secondary | ICD-10-CM | POA: Diagnosis not present

## 2024-07-02 ENCOUNTER — Ambulatory Visit (INDEPENDENT_AMBULATORY_CARE_PROVIDER_SITE_OTHER): Admitting: Family Medicine

## 2024-07-02 ENCOUNTER — Encounter: Payer: Self-pay | Admitting: Family Medicine

## 2024-07-02 VITALS — BP 181/111 | HR 96 | Temp 98.5°F | Resp 18 | Ht 62.0 in | Wt 127.0 lb

## 2024-07-02 DIAGNOSIS — Z780 Asymptomatic menopausal state: Secondary | ICD-10-CM | POA: Diagnosis not present

## 2024-07-02 DIAGNOSIS — Z13820 Encounter for screening for osteoporosis: Secondary | ICD-10-CM

## 2024-07-02 DIAGNOSIS — I10 Essential (primary) hypertension: Secondary | ICD-10-CM | POA: Diagnosis not present

## 2024-07-02 DIAGNOSIS — I739 Peripheral vascular disease, unspecified: Secondary | ICD-10-CM | POA: Diagnosis not present

## 2024-07-02 DIAGNOSIS — R918 Other nonspecific abnormal finding of lung field: Secondary | ICD-10-CM

## 2024-07-02 DIAGNOSIS — R030 Elevated blood-pressure reading, without diagnosis of hypertension: Secondary | ICD-10-CM

## 2024-07-02 DIAGNOSIS — Z1211 Encounter for screening for malignant neoplasm of colon: Secondary | ICD-10-CM | POA: Diagnosis not present

## 2024-07-02 NOTE — Progress Notes (Signed)
 Established Patient Office Visit  Subjective   Patient ID: Annette Stevens, female    DOB: [birth year]  Age: 78 y.o. MRN: [id number]  Chief Complaint  Patient presents with   Establish Care    HPI Discussed the use of AI scribe software for clinical note transcription with the patient, who gave verbal consent to proceed.  History of Present Illness   Annette Stevens is a 78 year old female who presents for a comprehensive review of her medical conditions.  She underwent a left total hip replacement on 06/18/23. Post-surgery, she experienced a three-quarters of an inch leg length discrepancy affecting her gait. She has been engaging in more walking and physical therapy, which has improved her walking ability.  She has a history of low potassium levels, which she believes contributed to episodes of heart fluttering. She does not take potassium supplements but manages her levels through diet. She reports that increasing potassium helped stabilize her blood pressure. She has a history of irregular heartbeat episodes linked to low potassium levels.  She reports that she has white coat HTN.  She does not check her BP at home because she cannot get the cuff on correctly.    A lung nodule was noted in her chart, but she is unaware of any follow-up or referral to a lung specialist. 10/17/2023 CT ABD and pelvis notes RML pulmonary micronodule.  Non follow-up required if she is low risk.  She is smoking, so she willneed follow-yp.  Will refer to pulmonology.  She quit smoking in 1995 after several attempts, having smoked since she was around [age], but she estimates she smoked less than a pack a day. Total pack-year history is about 22.    She experiences occasional back issues since 1987 but denies any current significant pain or radiation to the legs. She had an epidural in 1996, which provided temporary relief. She has a history of spondylolisthesis affecting her lower back.  She reports knee  issues, particularly swelling and altered gait due to leg length discrepancy post-hip surgery. Physical therapy has been beneficial, and she is now able to walk more effectively.She walks 1-2 miles a day.    She has a history of bladder prolapse and reports leakage but is not on any medication for it. She had a pessary removed before her major surgery, which caused significant bleeding during removal.  She takes levothyroxine  for hypothyroidism and vitamin D  as recommended. She also takes magnesium glycinate and milk thistle, noting that magnesium helps with flushing and milk thistle is for liver health.  She describes a past gallbladder issue with sludge noted on an 03/29/2024 US , which she associates with weight gain and dietary challenges. She monitors her diet closely, consuming low-calorie and low-carb meals.  She has a history of diverticulosis, diagnosed after a prolonged period of diarrhea and self-treatment. She was eventually treated with antibiotics, which helped manage the condition.  She reports a sore throat recently, which she has been treating with raw honey. No frequent illnesses, noting she rarely gets colds.  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     She has had a HYS.   Annette Stevens is a 78 year old female who presents for a comprehensive review of her medical conditions.  She underwent a left total hip replacement on August 28th of the previous year. Post-surgery, she experienced a three-quarters of an inch  leg length discrepancy affecting her gait. She has been engaging in more walking and physical therapy, which has improved her walking ability.  She has a history of low potassium levels, which she believes contributed to episodes of heart fluttering. She does not take potassium supplements but manages her levels through diet. Increasing potassium helped stabilize her blood pressure.  She has a history of irregular heartbeat episodes linked to low potassium levels, monitored with a device showing minimal episodes.  A lung nodule was noted in her chart, but she is unaware of any follow-up or referral to a lung specialist. She quit smoking in 1995 after several attempts, having smoked since she was around [age], but she estimates she smoked less than a pack a day.  She experiences occasional back issues since 1987 but denies any current significant pain or radiation to the legs. She had an epidural in 1996, which provided temporary relief. She has a history of spondylolisthesis affecting her lower back.  She reports knee issues, particularly swelling and altered gait due to leg length discrepancy post-hip surgery. Physical therapy has been beneficial, and she is now able to walk more effectively.  She has a history of bladder prolapse and reports leakage but is not on any medication for it. She had a pessary removed before her major surgery, which caused significant bleeding during removal.  She takes levothyroxine  for hypothyroidism and vitamin D  as recommended. She also takes magnesium glycinate and milk thistle, noting that magnesium helps with flushing and milk thistle is for liver health.  She describes a past gallbladder issue with sludge noted on a test, which she associates with weight gain and dietary challenges. She monitors her diet closely, consuming low-calorie and low-carb meals.  She has a history of diverticulosis, diagnosed after a prolonged period of diarrhea and self-treatment. She was eventually treated with antibiotics, which helped manage the condition.  She reports a sore throat recently, which she has been treating with raw honey. No frequent illnesses, noting she rarely gets colds.  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      ROS    Objective:     BP (!) 181/111 (BP  Location: Left Arm, Patient Position: Sitting, Cuff Size: Normal)   Pulse 96   Temp 98.5 F (36.9 C) (Oral)   Resp 18   Ht 5' 2 (1.575 m)   Wt 127 lb (57.6 kg)   SpO2 97%   BMI 23.23 kg/m    Physical Exam Vitals and nursing note reviewed.  Constitutional:      Appearance: Normal appearance.  HENT:     Head: Normocephalic and atraumatic.  Eyes:     Conjunctiva/sclera: Conjunctivae normal.  Cardiovascular:     Rate and Rhythm: Normal rate and regular rhythm.  Pulmonary:     Effort: Pulmonary effort is normal.     Breath sounds: Normal breath sounds.  Musculoskeletal:     Right lower leg: No edema.     Left lower leg: No edema.  Skin:    General: Skin is warm and dry.  Neurological:     Mental Status: She is alert and oriented to person, place, and time.  Psychiatric:        Mood and Affect: Mood normal.        Behavior: Behavior normal.        Thought Content: Thought content normal.  Judgment: Judgment normal.          Results for orders placed or performed in visit on 07/02/24  Urine Microalbumin w/creat. ratio  Result Value Ref Range   Creatinine, Urine 33.6 Not Estab. mg/dL   Microalbumin, Urine 84.1 Not Estab. ug/mL   Microalb/Creat Ratio 47 (H) 0 - 29 mg/g creat  Lipid panel  Result Value Ref Range   Cholesterol, Total 219 (H) 100 - 199 mg/dL   Triglycerides 895 0 - 149 mg/dL   HDL 75 >60 mg/dL   VLDL Cholesterol Cal 18 5 - 40 mg/dL   LDL Chol Calc (NIH) 873 (H) 0 - 99 mg/dL   Chol/HDL Ratio 2.9 0.0 - 4.4 ratio  Comprehensive metabolic panel with GFR  Result Value Ref Range   Glucose 106 (H) 70 - 99 mg/dL   BUN 19 8 - 27 mg/dL   Creatinine, Ser 9.25 0.57 - 1.00 mg/dL   eGFR 82 >40 fO/fpw/8.26   BUN/Creatinine Ratio 26 12 - 28   Sodium 139 134 - 144 mmol/L   Potassium 3.8 3.5 - 5.2 mmol/L   Chloride 97 96 - 106 mmol/L   CO2 24 20 - 29 mmol/L   Calcium 9.4 8.7 - 10.3 mg/dL   Total Protein 7.0 6.0 - 8.5 g/dL   Albumin 4.3 3.8 - 4.8 g/dL    Globulin, Total 2.7 1.5 - 4.5 g/dL   Bilirubin Total 0.4 0.0 - 1.2 mg/dL   Alkaline Phosphatase 104 44 - 121 IU/L   AST 15 0 - 40 IU/L   ALT 11 0 - 32 IU/L  CBC with Differential/Platelet  Result Value Ref Range   WBC 8.4 3.4 - 10.8 x10E3/uL   RBC 5.03 3.77 - 5.28 x10E6/uL   Hemoglobin 14.1 11.1 - 15.9 g/dL   Hematocrit 55.8 65.9 - 46.6 %   MCV 88 79 - 97 fL   MCH 28.0 26.6 - 33.0 pg   MCHC 32.0 31.5 - 35.7 g/dL   RDW 86.6 88.2 - 84.5 %   Platelets 326 150 - 450 x10E3/uL   Neutrophils 74 Not Estab. %   Lymphs 17 Not Estab. %   Monocytes 6 Not Estab. %   Eos 2 Not Estab. %   Basos 1 Not Estab. %   Neutrophils Absolute 6.3 1.4 - 7.0 x10E3/uL   Lymphocytes Absolute 1.4 0.7 - 3.1 x10E3/uL   Monocytes Absolute 0.5 0.1 - 0.9 x10E3/uL   EOS (ABSOLUTE) 0.2 0.0 - 0.4 x10E3/uL   Basophils Absolute 0.1 0.0 - 0.2 x10E3/uL   Immature Granulocytes 0 Not Estab. %   Immature Grans (Abs) 0.0 0.0 - 0.1 x10E3/uL      The 10-year ASCVD risk score (Arnett DK, et al., 2019) is: 66.9%     Assessment and Plan    Hypothyroidism Managed with levothyroxine . Reports symptoms suggestive of suboptimal thyroid  function, including skin changes and hair shedding. Concerns about current thyroid  function and potential need for medication adjustment. - Order thyroid  function tests to assess current levels and adjust levothyroxine  dosage if necessary.  Potassium Deficiency Low potassium levels causing heart fluttering. No current potassium supplementation. - Order potassium level test.  Prolapsed Bladder Prolapsed bladder with history of pessary use causing significant discomfort and bleeding. Currently not using any treatment.  Osteoporosis Risk Potential osteoporosis risk due to small wrist size. No recent DEXA scan reported. - Order DEXA scan to assess bone density.  Diverticulosis Managed with dietary modifications. - Monitor dietary intake to prevent exacerbation of symptoms.  Back  Pain Chronic back pain since 1987.  Knee Pain History of knee pain with past physical therapy. Reports improvement in walking post-hip replacement surgery. - Encourage continued physical activity to maintain mobility.  Tennis Elbow and Golfer's Elbow Tennis elbow on the right and golfer's elbow on the left. Uses light tape for symptom management. - Continue current management with light tape as needed.  Carpal Tunnel Tenosynovitis Carpal tunnel tenosynovitis affecting tendon sheaths. Manages symptoms with light tape. - Continue current management with light tape as needed.  Gallbladder Sludge Gallbladder sludge with past symptoms suggestive of gallbladder dysfunction. No current treatment.  Throat Redness Sore throat with redness. Self-treating with raw honey.       Return in about 4 weeks (around 07/30/2024).    Meer Reindl K Erandy Mceachern, MD

## 2024-07-03 LAB — COMPREHENSIVE METABOLIC PANEL WITH GFR
ALT: 11 IU/L (ref 0–32)
AST: 15 IU/L (ref 0–40)
Albumin: 4.3 g/dL (ref 3.8–4.8)
Alkaline Phosphatase: 104 IU/L (ref 44–121)
BUN/Creatinine Ratio: 26 (ref 12–28)
BUN: 19 mg/dL (ref 8–27)
Bilirubin Total: 0.4 mg/dL (ref 0.0–1.2)
CO2: 24 mmol/L (ref 20–29)
Calcium: 9.4 mg/dL (ref 8.7–10.3)
Chloride: 97 mmol/L (ref 96–106)
Creatinine, Ser: 0.74 mg/dL (ref 0.57–1.00)
Globulin, Total: 2.7 g/dL (ref 1.5–4.5)
Glucose: 106 mg/dL — ABNORMAL HIGH (ref 70–99)
Potassium: 3.8 mmol/L (ref 3.5–5.2)
Sodium: 139 mmol/L (ref 134–144)
Total Protein: 7 g/dL (ref 6.0–8.5)
eGFR: 82 mL/min/1.73 (ref >59)

## 2024-07-03 LAB — CBC WITH DIFFERENTIAL/PLATELET
Basophils Absolute: 0.1 x10E3/uL (ref 0.0–0.2)
Basos: 1 %
EOS (ABSOLUTE): 0.2 x10E3/uL (ref 0.0–0.4)
Eos: 2 %
Hematocrit: 44.1 % (ref 34.0–46.6)
Hemoglobin: 14.1 g/dL (ref 11.1–15.9)
Immature Grans (Abs): 0 x10E3/uL (ref 0.0–0.1)
Immature Granulocytes: 0 %
Lymphocytes Absolute: 1.4 x10E3/uL (ref 0.7–3.1)
Lymphs: 17 %
MCH: 28 pg (ref 26.6–33.0)
MCHC: 32 g/dL (ref 31.5–35.7)
MCV: 88 fL (ref 79–97)
Monocytes Absolute: 0.5 x10E3/uL (ref 0.1–0.9)
Monocytes: 6 %
Neutrophils Absolute: 6.3 x10E3/uL (ref 1.4–7.0)
Neutrophils: 74 %
Platelets: 326 x10E3/uL (ref 150–450)
RBC: 5.03 x10E6/uL (ref 3.77–5.28)
RDW: 13.3 % (ref 11.7–15.4)
WBC: 8.4 x10E3/uL (ref 3.4–10.8)

## 2024-07-03 LAB — LIPID PANEL
Chol/HDL Ratio: 2.9 ratio (ref 0.0–4.4)
Cholesterol, Total: 219 mg/dL — ABNORMAL HIGH (ref 100–199)
HDL: 75 mg/dL (ref >39)
LDL Chol Calc (NIH): 126 mg/dL — ABNORMAL HIGH (ref 0–99)
Triglycerides: 104 mg/dL (ref 0–149)
VLDL Cholesterol Cal: 18 mg/dL (ref 5–40)

## 2024-07-03 LAB — MICROALBUMIN / CREATININE URINE RATIO
Creatinine, Urine: 33.6 mg/dL
Microalb/Creat Ratio: 47 mg/g{creat} — ABNORMAL HIGH (ref 0–29)
Microalbumin, Urine: 15.8 ug/mL

## 2024-07-05 ENCOUNTER — Telehealth: Payer: Self-pay

## 2024-07-05 DIAGNOSIS — R030 Elevated blood-pressure reading, without diagnosis of hypertension: Secondary | ICD-10-CM | POA: Insufficient documentation

## 2024-07-05 DIAGNOSIS — I739 Peripheral vascular disease, unspecified: Secondary | ICD-10-CM | POA: Insufficient documentation

## 2024-07-05 DIAGNOSIS — Z1211 Encounter for screening for malignant neoplasm of colon: Secondary | ICD-10-CM | POA: Insufficient documentation

## 2024-07-05 NOTE — Assessment & Plan Note (Signed)
 Has a micronodule in the RML.  Will refer to pulmonology for FOLLOW-UP.  She is not low risk because she is smoking.

## 2024-07-05 NOTE — Assessment & Plan Note (Signed)
 Never had a colonoscopy and refuses one

## 2024-07-05 NOTE — Assessment & Plan Note (Addendum)
 Please bring your blood pressure cuff in and we will teach you how to use it.  Very important to establish whether you have HTN and white coat HTN.  Checking CMP for renal function and UMACR.

## 2024-07-05 NOTE — Telephone Encounter (Signed)
 Copied from CRM #[id number]. Topic: Clinical - Lab/Test Results >> Jul 05, 2024  9:46 AM Viola F wrote: Reason for CRM: Patient would like call back because she believes her lab results were compromised she didn't fast fast and was given nothing to clean herself. She WAS on a high dose of biotin  and believes it effected her thyroid  testing. Please call her at  [phone] (H)

## 2024-07-05 NOTE — Assessment & Plan Note (Signed)
 Extensive atherosclerosis of abdominal aorta seen on abdominal CT.  Needs to take a statin medication.  ASCVD risk is 69% (mostly because her BP is not controlled)

## 2024-07-05 NOTE — Assessment & Plan Note (Signed)
 Checking her magnesium level.

## 2024-07-06 ENCOUNTER — Telehealth: Payer: Self-pay | Admitting: Family Medicine

## 2024-07-06 ENCOUNTER — Other Ambulatory Visit: Payer: Self-pay | Admitting: Family Medicine

## 2024-07-06 MED ORDER — LEVOTHYROXINE SODIUM 112 MCG PO TABS: 112.0000 ug | ORAL_TABLET | Freq: Every day | ORAL | 1 refills | Status: DC

## 2024-07-06 NOTE — Telephone Encounter (Signed)
 Called and left message that I would like to go over her lab results with her.

## 2024-07-06 NOTE — Telephone Encounter (Signed)
 Reviewed her lab findings with her.  Advise she has urine protein that may indicate if she has hypertension.  Asked her to continue checking her blood pressure at home.  She advised that she cannot use the cuff.  Advise she can come appear we will help her learn how to use it. She was taking biotin  at the time she got her thyroid  adjusted.  She was not taking biotin  for this blood drawl.  Her thyroid  results are normal.  She wants a stronger dosage of levothyroxine .  Agreed to increase her from 100 to a day and recheck in 2 months

## 2024-07-08 LAB — TSH+FREE T4
Free T4: 1.38 ng/dL (ref 0.82–1.77)
TSH: 0.742 u[IU]/mL (ref 0.450–4.500)

## 2024-07-08 LAB — SPECIMEN STATUS REPORT

## 2024-08-30 ENCOUNTER — Telehealth: Payer: Self-pay

## 2024-08-30 NOTE — Telephone Encounter (Signed)
 Copied from CRM [id number]. Topic: Clinical - Medication Question >> Aug 30, 2024  2:18 PM Dedra B wrote: Reason for CRM: Pt wants to go back to her levothyroxine  125 mcg. She said she can't warm up. Pt is requesting a 3 month supply.

## 2024-08-31 ENCOUNTER — Other Ambulatory Visit: Payer: Self-pay | Admitting: Family Medicine

## 2024-08-31 MED ORDER — LEVOTHYROXINE SODIUM 125 MCG PO TABS: 125.0000 ug | ORAL_TABLET | Freq: Every day | ORAL | 0 refills | Status: DC

## 2024-09-01 NOTE — Telephone Encounter (Signed)
 Patient checking on status of her medication refill request. Advised Rx sent in yesterday to [facility] - [address] and to do a blood test in 3 months.   Patient will check in with Pharmacy.

## 2024-09-15 ENCOUNTER — Ambulatory Visit: Payer: Self-pay | Admitting: *Deleted

## 2024-09-15 ENCOUNTER — Ambulatory Visit (INDEPENDENT_AMBULATORY_CARE_PROVIDER_SITE_OTHER): Admitting: Family Medicine

## 2024-09-15 ENCOUNTER — Encounter: Payer: Self-pay | Admitting: Family Medicine

## 2024-09-15 VITALS — BP 184/109 | HR 98 | Resp 16 | Ht 62.0 in | Wt 128.0 lb

## 2024-09-15 DIAGNOSIS — M19011 Primary osteoarthritis, right shoulder: Secondary | ICD-10-CM | POA: Diagnosis not present

## 2024-09-15 DIAGNOSIS — B029 Zoster without complications: Secondary | ICD-10-CM

## 2024-09-15 MED ORDER — CYCLOBENZAPRINE HCL 5 MG PO TABS: 5.0000 mg | ORAL_TABLET | Freq: Every evening | ORAL | 0 refills | Status: AC | PRN

## 2024-09-15 MED ORDER — VALACYCLOVIR HCL 500 MG PO TABS: 1000.0000 mg | ORAL_TABLET | Freq: Three times a day (TID) | ORAL | 0 refills | Status: AC

## 2024-09-15 NOTE — Telephone Encounter (Signed)
 FYI Only or Action Required?: FYI only for provider: appointment scheduled on 11/26.  Patient was last seen in primary care on 07/02/2024 by Ziglar, Susan K, MD.  Called Nurse Triage reporting Rash.  Symptoms began several days ago.  Interventions attempted: Nothing.  Symptoms are: gradually worsening.  Triage Disposition: See Physician Within 24 Hours  Patient/caregiver understands and will follow disposition?: Yes  Copied from CRM [id number]. Topic: Clinical - Red Word Triage >> Sep 15, 2024  9:11 AM Montie POUR wrote: Red Word that prompted transfer to Nurse Triage:  Her left arm has a rash, very red, looks like it is fuzzy; this has been going on for 2 days; it is getting worse; left thumb is sore and getting red Reason for Disposition  [1] Looks infected (e.g., spreading redness, pus) AND [2] no fever    Patient is not having pain, itching- but rash seems to be spreading  Answer Assessment - Initial Assessment Questions 1. APPEARANCE of RASH: What does the rash look like? (e.g., blisters, dry flaky skin, red spots, redness, sores)     Flat red , splotchy, bumps 2. LOCATION: Where is the rash located?     upper L arm and elbow- forearm, wrist 3. NUMBER: How many spots are there?      Multiple spots- seems to be spreading 4. SIZE: How big are the spots? (e.g., inches, cm; or compare to size of pinhead, tip of pen, eraser, pea)      Tiny bumps,  5. ONSET: When did the rash start?      Monday am 6. ITCHING: Does the rash itch? If Yes, ask: How bad is the itch?  (Scale 0-10; or none, mild, moderate, severe)     no 7. PAIN: Does the rash hurt? If Yes, ask: How bad is the pain?  (Scale 0-10; or none, mild, moderate, severe)     no 8. OTHER SYMPTOMS: Do you have any other symptoms? (e.g., fever)     Fever - 100.1- last night  Protocols used: Rash or Redness - Localized-A-AH

## 2024-09-15 NOTE — Progress Notes (Signed)
 Acute Care Office Visit  Subjective:   Annette Stevens Apr 13, 1946 09/15/2024  Chief Complaint  Patient presents with   Rash   HPI: Discussed the use of AI scribe software for clinical note transcription with the patient, who gave verbal consent to proceed.  History of Present Illness   [name] is a 78 year old female who presents with a rash and ear pain.  The rash began on Monday morning, initially presenting as a sore joint that improved after several hours. She applied an advanced healing bandage, which led to the development of a small exudate. The rash is localized and does not burn, itch, or tingle. She has been avoiding activities such as showering due to fear of aggravating the rash. No recent exposure to plants, but she did yard work a couple of weeks ago.  She experiences ear pain in her left ear, describing it as having dry stuff and occasional pain. She previously managed the symptoms with raw honey, which provided some relief, but the pain has returned. The ear is often irritated, itchy, and warm. 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  Her past medical history includes a total hip replacement in August of the previous year, during which she experienced elevated blood pressure. She also has a history of 'golfer's elbow' that has been unresponsive to topical treatments. She takes thyroid  medication and has learned to space out her D3 intake to avoid interference with her thyroid  medication absorption. She has a history of bunion surgery in 2005, which she feels worsened her condition, causing her big toe to drop and affect her gait. She expresses difficulty with taking larger pills and prefers smaller doses when possible.     The following portions of the patient's history were reviewed and updated as appropriate: past medical history, past  surgical history, family history, social history, allergies, medications, and problem list.   Patient Active Problem List   Diagnosis Date Noted   Elevated blood pressure reading 07/05/2024   PAD (peripheral artery disease) 07/05/2024   Hypomagnesemia 07/05/2024   Screening for colon cancer 07/05/2024   Muscle weakness 04/17/2021   Lung nodules 12/17/2018   Spondylolysis 11/18/2018   Sciatica of left side 11/18/2018   Chronic right shoulder pain 11/18/2018   Hallux valgus of right foot 11/18/2018   Spondylolisthesis at L4-L5 level 11/18/2018   Spondylolisthesis 12/10/2017   Osteoarthritis of knee 12/10/2017   Degenerative joint disease of shoulder region 12/10/2017   Grieving 12/10/2017   Lipoma of left lower extremity 12/10/2017   Cystocele, midline 04/15/2017   Prolapse of vaginal vault after hysterectomy 04/15/2017   Essential hypertension, benign 09/13/2016   Hypothyroidism 09/13/2016   Vaginal atrophy 09/29/2012   Past Medical History:  Diagnosis Date   Hypertension    Hypokalemia    Hypothyroidism    Premature atrial contractions    PSVT (paroxysmal supraventricular tachycardia)    a. 11/2022 Zio: Predominantly sinus rhythm (average 80, range 52-182).  3 SVT runs-fastest 4 beats at 182 bpm, longest 17.9 seconds at 148 bpm.  Rare isolated PACs and PVCs.   Past Surgical History:  Procedure Laterality Date   BUNIONECTOMY Left 2006   ETHMOIDECTOMY Left 02/24/2018   Procedure: ETHMOIDECTOMY;  Surgeon: Blair Mt, MD;  Location: [facility];  Service: ENT;  Laterality: Left;   FRONTAL SINUS EXPLORATION  Left 02/24/2018   Procedure: FRONTAL SINUS EXPLORATION;  Surgeon: Blair Mt, MD;  Location: [facility];  Service: ENT;  Laterality: Left;   IMAGE GUIDED SINUS SURGERY N/A 02/24/2018   Procedure: IMAGE GUIDED SINUS SURGERY;  Surgeon: Blair Mt, MD;  Location: [facility];  Service: ENT;  Laterality: N/A;  GAVE DISK BRENDA 3-21 KP   MAXILLARY  ANTROSTOMY Left 02/24/2018   Procedure: ENDSOCPIC MAXILLARY ANTROSTOMY TOTAL;  Surgeon: Blair Mt, MD;  Location: [facility];  Service: ENT;  Laterality: Left;   SPHENOIDECTOMY Left 02/24/2018   Procedure: CLEONE;  Surgeon: Blair Mt, MD;  Location: [facility];  Service: ENT;  Laterality: Left;   TUBAL LIGATION  1969   VAGINAL HYSTERECTOMY  1980   partial still has ovaries   Family History  Problem Relation Age of Onset   Dementia Mother    Cancer Mother        colon   Hypertension Father    Outpatient Medications Prior to Visit  Medication Sig Dispense Refill   Cholecalciferol (VITAMIN D ) 50 MCG (2000 UT) CAPS Take 1 capsule by mouth daily.     cholecalciferol (VITAMIN D3) 25 MCG (1000 UNIT) tablet Take 1,000 Units by mouth daily.     levothyroxine  (SYNTHROID ) 125 MCG tablet Take 1 tablet (125 mcg total) by mouth daily. 90 tablet 0   Magnesium Bisglycinate (MAG GLYCINATE PO) Take 240 mg by mouth daily.     Milk Thistle 250 MG CAPS Take 1 capsule by mouth daily.     No facility-administered medications prior to visit.   Allergies  Allergen Reactions   Ace Inhibitors     Other reaction(s): Headache Blood pressure medications; Headaches, leg cramps, etc.   Beta Adrenergic Blockers Other (See Comments)    Blood pressure medications; Headaches, leg cramps, etc.   Calcium Channel Blockers Other (See Comments)    Pt is unsure just she cannot take them    Other Other (See Comments)    Blood pressure medications   ROS: see HPI     Objective:   Today's Vitals   09/15/24 1312  BP: (!) 184/109  Pulse: 98  Resp: 16  SpO2: 99%  Weight: 128 lb (58.1 kg)  Height: 5' 2 (1.575 m)  PainSc: 0-No pain   Physical Exam Vitals reviewed.  Constitutional:      Appearance: Normal appearance.  HENT:     Right Ear: Tympanic membrane, ear canal and external ear normal.     Left Ear: Tympanic membrane and ear canal normal. Swelling (external ear  inflammed & erythematous) and tenderness present.  Cardiovascular:     Rate and Rhythm: Regular rhythm. Tachycardia present.     Pulses: Normal pulses.     Heart sounds: Normal heart sounds.  Pulmonary:     Effort: Pulmonary effort is normal.     Breath sounds: Normal breath sounds.  Skin:    General: Skin is warm.     Findings: Erythema and rash present. Rash is vesicular.  Neurological:     Mental Status: She is alert.  Psychiatric:        Mood and Affect: Mood normal.        Behavior: Behavior normal.      Assessment & Plan:   1. Herpes zoster without complication (Primary) Unilateral vesicular rash and left ear involvement suggest herpes zoster. Left outer ear swelling and redness present with blistering and healing. The left and right tympanic membranes are translucent, pearly grey, and shiny  with no bulging or retraction. Normal in appearance with good cone-shaped light reflection of light and smooth consistency. Landmarks are clearly visible. Most likely related to Shingles- however, advised patient to reach out if ear is still bothering her after being on Valtrex  for 72 hours (around Monday). Prescribed Valtrex  500 mg, two tablets TID. Advised to monitor symptoms and contact if no improvement by weekend for potential antibiotic.  - valACYclovir  (VALTREX ) 500 MG tablet; Take 2 tablets (1,000 mg total) by mouth 3 (three) times daily for 7 days.  Dispense: 42 tablet; Refill: 0  2. Primary osteoarthritis of right shoulder Patient reports ongoing shoulder pain and would like muscle relaxer. Rx sent to pharmacy on file.  - cyclobenzaprine  (FLEXERIL ) 5 MG tablet; Take 1 tablet (5 mg total) by mouth at bedtime as needed for muscle spasms.  Dispense: 30 tablet; Refill: 0  Meds ordered this encounter  Medications   valACYclovir  (VALTREX ) 500 MG tablet    Sig: Take 2 tablets (1,000 mg total) by mouth 3 (three) times daily for 7 days.    Dispense:  42 tablet    Refill:  0    Supervising  Provider:   ZIGLAR, SUSAN K [[id number]]   cyclobenzaprine  (FLEXERIL ) 5 MG tablet    Sig: Take 1 tablet (5 mg total) by mouth at bedtime as needed for muscle spasms.    Dispense:  30 tablet    Refill:  0    Generic    Supervising Provider:   ZIGLAR, SUSAN K [[id number]]    Return if symptoms worsen or fail to improve.   Patient to reach out to office if new, worrisome, or unresolved symptoms arise or if no improvement in patient's condition. Patient verbalized understanding and is agreeable to treatment plan. All questions answered to patient's satisfaction.    Evalene Arts, FNP

## 2024-09-15 NOTE — Patient Instructions (Signed)

## 2024-09-22 ENCOUNTER — Other Ambulatory Visit: Payer: Self-pay | Admitting: Family Medicine

## 2024-09-22 ENCOUNTER — Telehealth: Payer: Self-pay

## 2024-09-22 DIAGNOSIS — H60502 Unspecified acute noninfective otitis externa, left ear: Secondary | ICD-10-CM

## 2024-09-22 MED ORDER — CEPHALEXIN 500 MG PO CAPS: 500.0000 mg | ORAL_CAPSULE | Freq: Two times a day (BID) | ORAL | 0 refills | Status: AC

## 2024-09-22 NOTE — Telephone Encounter (Signed)
 Copied from CRM #[id number]. Topic: Clinical - Medical Advice >> Sep 22, 2024 10:10 AM Emylou G wrote: Reason for CRM: Patient was seen for shingles and is almost finished with her medication.  The left ear pain hasn't gone away, and left, joints, elbow arm still in pain ( pins and needles ) - needs to know if she is going to get a different type of med per the discussion ( antibiotic - is what was talked about )

## 2024-10-01 ENCOUNTER — Ambulatory Visit: Admitting: Family Medicine

## 2024-10-12 ENCOUNTER — Ambulatory Visit: Payer: Self-pay

## 2024-10-12 ENCOUNTER — Encounter: Payer: Self-pay | Admitting: Family Medicine

## 2024-10-12 ENCOUNTER — Ambulatory Visit: Admitting: Family Medicine

## 2024-10-12 VITALS — BP 155/95 | HR 72 | Resp 16 | Ht 62.0 in | Wt 131.0 lb

## 2024-10-12 DIAGNOSIS — B0229 Other postherpetic nervous system involvement: Secondary | ICD-10-CM | POA: Diagnosis not present

## 2024-10-12 DIAGNOSIS — B029 Zoster without complications: Secondary | ICD-10-CM | POA: Diagnosis not present

## 2024-10-12 MED ORDER — GABAPENTIN 100 MG PO CAPS: 100.0000 mg | ORAL_CAPSULE | Freq: Every day | ORAL | 3 refills | Status: AC

## 2024-10-12 NOTE — Progress Notes (Addendum)
 "  Established Patient Office Visit  Subjective   Patient ID: Annette Stevens, female    DOB: June 25, 1946  Age: 78 y.o. MRN: [id number]  Chief Complaint  Patient presents with   Herpes Zoster    HPI delightful 78 year old with hypothyroidism, PAD, lung nodules, vitamin D  deficiency, Goffer's elbow, elevated blood pressure (she contends it is whitecoat hypertension), lumbar DDD, s/p hysterectomy.  She broke out in a rash on her left arm on 11/24.  Got evaluated and started Valtrex  on 11/26.  She completed the entire prescription.  She still has a rash on her left anterior arm and forearm and in the thumb, index and middle finger of the left hand.  She reports that she is in terrible pain.  It is hard to wear clothes because the arm hurts so badly.  She is now using a raw sugar lotion that seems to help.  The rash hurts and she is not sleeping well because of the pain.  She did not get the Shingles vaccine.    Discussed the use of AI scribe software for clinical note transcription with the patient, who gave verbal consent to proceed.  History of Present Illness   Annette Stevens is a 78 year old female who presents with persistent numbness and pain in her right hand following a shingles outbreak.  She experiences numbness and pain primarily in her right thumb and two fingers, describing them as 'half numb.' These symptoms began after a shingles outbreak on September 13, 2024, with a rash extending from her shoulder to her hand. Initially, she did not recognize the rash as shingles.  She was treated with Valtrex , taking two smaller doses three times a day due to difficulty swallowing large pills, and a sulfa antibiotic. Despite these treatments, she continues to experience significant discomfort, particularly in her hand, which she describes as worse than childbirth.  She has difficulty sleeping due to the discomfort and reports that her elbow was swollen but has improved. She has tried various  over-the-counter pain medications without relief. She has not tried gabapentin  or other nerve pain medications previously.  She lives alone. She has been using a cream to manage dryness and irritation from clothing. She has been dealing with the symptoms for a month.        Objective:     BP (!) 155/95   Pulse 72   Resp 16   Ht 5' 2 (1.575 m)   Wt 131 lb (59.4 kg)   LMP  (LMP Unknown)   BMI 23.96 kg/m    Physical Exam Vitals and nursing note reviewed.  Constitutional:      Appearance: Normal appearance.  HENT:     Head: Normocephalic and atraumatic.  Eyes:     Conjunctiva/sclera: Conjunctivae normal.  Cardiovascular:     Rate and Rhythm: Normal rate and regular rhythm.  Pulmonary:     Effort: Pulmonary effort is normal.     Breath sounds: Normal breath sounds.  Musculoskeletal:     Right lower leg: No edema.     Left lower leg: No edema.  Skin:    General: Skin is warm and dry.     Comments: Erythematous macules on her anterior arm, forearm and web of thumb.  No crusting.  No  longer has vesicles.    Neurological:     Mental Status: She is alert and oriented to person, place, and time.  Psychiatric:        Mood and Affect: Mood  normal.        Behavior: Behavior normal.        Thought Content: Thought content normal.        Judgment: Judgment normal.          No results found for any visits on 10/12/24.    The 10-year ASCVD risk score (Arnett DK, et al., 2019) is: 55.8%    Assessment & Plan:  Herpes zoster without complication -     Gabapentin ; Take 1 capsule (100 mg total) by mouth at bedtime.  Dispense: 30 capsule; Refill: 3  Herpes zoster involving cervical dermatome Assessment & Plan: She has been taking anything for pain.  She is not resting well.  Offered her gabapentin  100 mg nightly.  Advised it could make her dizzy or swimmy headed.  If she is able to tolerate gabapentin  at night may try a second 100 mg gabapentin  during the day.  She is to  call 12/26 let us  know how she is doing.      No follow-ups on file.    Emory Leaver K Hjalmar Ballengee, MD "

## 2024-10-12 NOTE — Assessment & Plan Note (Signed)
 She has been taking anything for pain.  She is not resting well.  Offered her gabapentin  100 mg nightly.  Advised it could make her dizzy or swimmy headed.  If she is able to tolerate gabapentin  at night may try a second 100 mg gabapentin  during the day.  She is to call 12/26 let us  know how she is doing.

## 2024-10-12 NOTE — Telephone Encounter (Signed)
 FYI Only or Action Required?: FYI only for provider: appointment scheduled on 10/12/2024 at 3:50 PM.  Patient was last seen in primary care on 09/15/2024 by Towana Small, FNP.  Called Nurse Triage reporting Hand Pain.  Symptoms began several weeks ago.  Interventions attempted: Rest, hydration, or home remedies.  Symptoms are: unchanged.  Triage Disposition: See HCP Within 4 Hours (Or PCP Triage)  Patient/caregiver understands and will follow disposition?: Yes  Copied from CRM #[id number]. Topic: Clinical - Red Word Triage >> Oct 12, 2024 10:38 AM Tiffini S wrote: [facility] that prompted transfer to Nurse Triage: have shingles and cannot use the left hand- having extreme shooting pain/ numbness/ swelling in thumb and first two fingers/ was up all night and cannot control with OTC medication Advil Reason for Disposition  [1] SEVERE pain (e.g., excruciating, unable to use hand at all) AND [2] not improved after 2 hours of pain medicine  Answer Assessment - Initial Assessment Questions Patient reports shooting pain, numbness and swelling to left hand involving the thumb and first two fingers. Patient reports pain 8 out of 10. Scheduled to see PCP this afternoon at 3:50 PM  1. ONSET: When did the pain start?     09/15/2024-was diagnosed with shingles 2. LOCATION: Where is the pain located?     Left hand-involving the thumb and first two fingers of the hand 3. PAIN: How bad is the pain? (Scale 1-10; or mild, moderate, severe)     8 out of 10 4. WORK OR EXERCISE: Has there been any recent work or exercise that involved this part (i.e., hand or wrist) of the body?     no 5. CAUSE: What do you think is causing the pain?     shingles 6. AGGRAVATING FACTORS: What makes the pain worse? (e.g., using computer)     Trying to use her hand 7. OTHER SYMPTOMS: Do you have any other symptoms? (e.g., fever, neck pain, numbness or tingling, rash, swelling)     Numbness, swelling,  rash  Protocols used: Hand Pain-A-AH

## 2024-10-15 ENCOUNTER — Ambulatory Visit: Payer: Self-pay | Admitting: *Deleted

## 2024-10-15 ENCOUNTER — Telehealth: Payer: Self-pay | Admitting: Family Medicine

## 2024-10-15 NOTE — Telephone Encounter (Signed)
 FYI Only or Action Required?: Action required by provider: update on patient condition.  Patient was last seen in primary care on 10/12/2024 by Ziglar, Susan K, MD.  Called Nurse Triage reporting Pain.  Symptoms began several weeks ago.  Interventions attempted: Prescription medications: gabapentin .  Symptoms are: stable.  Triage Disposition: Home Care  Patient/caregiver understands and will follow disposition?: Yes   See NT encounter     Copied from CRM #[id number]. Topic: Clinical - Red Word Triage >> Oct 15, 2024  7:45 AM Montie POUR wrote: Red Word that prompted transfer to Nurse Triage:  She is calling Dr. Ziglar to let her know that gabapentin  (NEURONTIN ) 100 MG capsule seems to be working. She did sleep last night for 8 hours. The only thing is she had chills last night, so she is thinking symptoms might be getting a worse.The first night she took medication her shoulder, elbow, thumb got warm in the places she has shingles. She wants to know if this medication is working. Reason for Disposition  Hand pain    Left thumb  Answer Assessment - Initial Assessment Questions Patient calling to report f/u call per PCP request . Patient reports gabapentin  seems to be working. No dizziness, no chest pain no difficulty breathing .      1. ONSET: When did the pain start?     November 2. LOCATION: Where is the pain located?     Left shoulder, elbow, fingers 3. PAIN: How bad is the pain? (Scale 1-10; or mild, moderate, severe)     Comes and goes sharp pain in left thumb 4. WORK OR EXERCISE: Has there been any recent work or exercise that involved this part (i.e., hand or wrist) of the body?     na 5. CAUSE: What do you think is causing the pain?     Recent dx  6. AGGRAVATING FACTORS: What makes the pain worse? (e.g., using computer)     Not sure  7. OTHER SYMPTOMS: Do you have any other symptoms? (e.g., fever, neck pain, numbness or tingling, rash, swelling)      Shooting pain like electricity in left thumb at times, gets cold at times lasting couple of minutes. Reports chills last night temp 99.1. did not report fever today. Feeling tired but did sleep 8 hours last night. 8. PREGNANCY: Is there any chance you are pregnant? When was your last menstrual period?     na  Protocols used: Hand Pain-A-AH

## 2024-10-15 NOTE — Telephone Encounter (Signed)
 Discussed how the gabapentin  may be helping her sleep.  She is not ready to try to take gabapentin  during the day.  She is afraid that it will make her too sleepy during the day.

## 2024-11-01 ENCOUNTER — Encounter: Payer: Self-pay | Admitting: *Deleted

## 2024-11-26 ENCOUNTER — Other Ambulatory Visit: Payer: Self-pay | Admitting: Family Medicine
